# Patient Record
Sex: Male | Born: 1955 | Hispanic: No | Marital: Married | Smoking: Former smoker
Health system: Southern US, Community
[De-identification: ages and names within clinical notes are randomized; demographics above are authoritative.]

## PROBLEM LIST (undated history)

## (undated) ENCOUNTER — Ambulatory Visit (HOSPITAL_BASED_OUTPATIENT_CLINIC_OR_DEPARTMENT_OTHER)

## (undated) DIAGNOSIS — I2510 Atherosclerotic heart disease of native coronary artery without angina pectoris: Secondary | ICD-10-CM

## (undated) DIAGNOSIS — Z794 Long term (current) use of insulin: Secondary | ICD-10-CM

## (undated) DIAGNOSIS — E119 Type 2 diabetes mellitus without complications: Secondary | ICD-10-CM

## (undated) DIAGNOSIS — M4640 Discitis, unspecified, site unspecified: Secondary | ICD-10-CM

## (undated) DIAGNOSIS — M549 Dorsalgia, unspecified: Secondary | ICD-10-CM

## (undated) DIAGNOSIS — E559 Vitamin D deficiency, unspecified: Secondary | ICD-10-CM

## (undated) DIAGNOSIS — IMO0001 Reserved for inherently not codable concepts without codable children: Secondary | ICD-10-CM

## (undated) DIAGNOSIS — I219 Acute myocardial infarction, unspecified: Secondary | ICD-10-CM

## (undated) DIAGNOSIS — G8929 Other chronic pain: Secondary | ICD-10-CM

## (undated) DIAGNOSIS — I10 Essential (primary) hypertension: Secondary | ICD-10-CM

## (undated) HISTORY — PX: CORONARY ANGIOPLASTY: SHX604

## (undated) HISTORY — PX: BACK SURGERY: SHX140

## (undated) HISTORY — PX: VASECTOMY: SHX75

## (undated) HISTORY — PX: CARDIAC CATHETERIZATION: SHX172

## (undated) HISTORY — PX: TONSILLECTOMY: SUR1361

## (undated) HISTORY — PX: HAND SURGERY: SHX662

## (undated) HISTORY — PX: COLONOSCOPY: SHX174

---

## 2001-02-01 ENCOUNTER — Emergency Department (HOSPITAL_COMMUNITY): Admission: EM | Admit: 2001-02-01 | Discharge: 2001-02-01 | Payer: Self-pay | Admitting: Emergency Medicine

## 2001-02-05 ENCOUNTER — Encounter: Payer: Self-pay | Admitting: Orthopedic Surgery

## 2001-02-05 ENCOUNTER — Ambulatory Visit (HOSPITAL_COMMUNITY): Admission: RE | Admit: 2001-02-05 | Discharge: 2001-02-05 | Payer: Self-pay | Admitting: Orthopedic Surgery

## 2014-12-13 ENCOUNTER — Other Ambulatory Visit: Payer: Self-pay | Admitting: Orthopedic Surgery

## 2014-12-13 ENCOUNTER — Ambulatory Visit
Admission: RE | Admit: 2014-12-13 | Discharge: 2014-12-13 | Disposition: A | Discharge status: Home / Self Care | Payer: BLUE CROSS/BLUE SHIELD | Source: Ambulatory Visit | Attending: Orthopedic Surgery | Admitting: Orthopedic Surgery

## 2014-12-13 DIAGNOSIS — T148XXA Other injury of unspecified body region, initial encounter: Secondary | ICD-10-CM

## 2014-12-18 ENCOUNTER — Inpatient Hospital Stay (HOSPITAL_COMMUNITY): Payer: BLUE CROSS/BLUE SHIELD

## 2014-12-18 ENCOUNTER — Encounter (HOSPITAL_COMMUNITY): Payer: Self-pay | Admitting: Orthopedic Surgery

## 2014-12-18 ENCOUNTER — Inpatient Hospital Stay (HOSPITAL_COMMUNITY)
Admission: AD | Admit: 2014-12-18 | Discharge: 2014-12-20 | DRG: 488 | Disposition: A | Discharge status: Home Health | Payer: BLUE CROSS/BLUE SHIELD | Source: Ambulatory Visit | Attending: Orthopedic Surgery | Admitting: Orthopedic Surgery

## 2014-12-18 DIAGNOSIS — S82141A Displaced bicondylar fracture of right tibia, initial encounter for closed fracture: Principal | ICD-10-CM | POA: Diagnosis present

## 2014-12-18 DIAGNOSIS — S83241A Other tear of medial meniscus, current injury, right knee, initial encounter: Secondary | ICD-10-CM | POA: Diagnosis present

## 2014-12-18 DIAGNOSIS — Z833 Family history of diabetes mellitus: Secondary | ICD-10-CM

## 2014-12-18 DIAGNOSIS — Z955 Presence of coronary angioplasty implant and graft: Secondary | ICD-10-CM

## 2014-12-18 DIAGNOSIS — E785 Hyperlipidemia, unspecified: Secondary | ICD-10-CM

## 2014-12-18 DIAGNOSIS — D62 Acute posthemorrhagic anemia: Secondary | ICD-10-CM | POA: Diagnosis not present

## 2014-12-18 DIAGNOSIS — S82143A Displaced bicondylar fracture of unspecified tibia, initial encounter for closed fracture: Secondary | ICD-10-CM

## 2014-12-18 DIAGNOSIS — W11XXXA Fall on and from ladder, initial encounter: Secondary | ICD-10-CM | POA: Diagnosis present

## 2014-12-18 DIAGNOSIS — E559 Vitamin D deficiency, unspecified: Secondary | ICD-10-CM | POA: Diagnosis present

## 2014-12-18 DIAGNOSIS — E1165 Type 2 diabetes mellitus with hyperglycemia: Secondary | ICD-10-CM | POA: Diagnosis present

## 2014-12-18 DIAGNOSIS — Z87891 Personal history of nicotine dependence: Secondary | ICD-10-CM | POA: Diagnosis not present

## 2014-12-18 DIAGNOSIS — Z01818 Encounter for other preprocedural examination: Secondary | ICD-10-CM

## 2014-12-18 DIAGNOSIS — Z79899 Other long term (current) drug therapy: Secondary | ICD-10-CM | POA: Diagnosis not present

## 2014-12-18 DIAGNOSIS — M549 Dorsalgia, unspecified: Secondary | ICD-10-CM | POA: Diagnosis present

## 2014-12-18 DIAGNOSIS — Z794 Long term (current) use of insulin: Secondary | ICD-10-CM | POA: Diagnosis not present

## 2014-12-18 DIAGNOSIS — S83281A Other tear of lateral meniscus, current injury, right knee, initial encounter: Secondary | ICD-10-CM | POA: Diagnosis present

## 2014-12-18 DIAGNOSIS — Z79891 Long term (current) use of opiate analgesic: Secondary | ICD-10-CM | POA: Diagnosis not present

## 2014-12-18 DIAGNOSIS — Z01810 Encounter for preprocedural cardiovascular examination: Secondary | ICD-10-CM | POA: Diagnosis not present

## 2014-12-18 DIAGNOSIS — Z7902 Long term (current) use of antithrombotics/antiplatelets: Secondary | ICD-10-CM | POA: Diagnosis not present

## 2014-12-18 DIAGNOSIS — Z8249 Family history of ischemic heart disease and other diseases of the circulatory system: Secondary | ICD-10-CM | POA: Diagnosis not present

## 2014-12-18 DIAGNOSIS — I2510 Atherosclerotic heart disease of native coronary artery without angina pectoris: Secondary | ICD-10-CM | POA: Diagnosis present

## 2014-12-18 DIAGNOSIS — I10 Essential (primary) hypertension: Secondary | ICD-10-CM

## 2014-12-18 DIAGNOSIS — G8929 Other chronic pain: Secondary | ICD-10-CM | POA: Diagnosis present

## 2014-12-18 DIAGNOSIS — E119 Type 2 diabetes mellitus without complications: Secondary | ICD-10-CM

## 2014-12-18 DIAGNOSIS — Z419 Encounter for procedure for purposes other than remedying health state, unspecified: Secondary | ICD-10-CM

## 2014-12-18 DIAGNOSIS — I252 Old myocardial infarction: Secondary | ICD-10-CM

## 2014-12-18 HISTORY — DX: Discitis, unspecified, site unspecified: M46.40

## 2014-12-18 HISTORY — DX: Reserved for inherently not codable concepts without codable children: IMO0001

## 2014-12-18 HISTORY — DX: Dorsalgia, unspecified: M54.9

## 2014-12-18 HISTORY — DX: Vitamin D deficiency, unspecified: E55.9

## 2014-12-18 HISTORY — DX: Acute myocardial infarction, unspecified: I21.9

## 2014-12-18 HISTORY — DX: Atherosclerotic heart disease of native coronary artery without angina pectoris: I25.10

## 2014-12-18 HISTORY — DX: Long term (current) use of insulin: Z79.4

## 2014-12-18 HISTORY — DX: Displaced bicondylar fracture of unspecified tibia, initial encounter for closed fracture: S82.143A

## 2014-12-18 HISTORY — DX: Other chronic pain: G89.29

## 2014-12-18 HISTORY — DX: Type 2 diabetes mellitus without complications: E11.9

## 2014-12-18 LAB — APTT: aPTT: 34 seconds (ref 24–37)

## 2014-12-18 LAB — GLUCOSE, CAPILLARY
GLUCOSE-CAPILLARY: 192 mg/dL — AB (ref 65–99)
GLUCOSE-CAPILLARY: 143 mg/dL — AB (ref 65–99)
Glucose-Capillary: 186 mg/dL — ABNORMAL HIGH (ref 65–99)

## 2014-12-18 LAB — COMPREHENSIVE METABOLIC PANEL
ALT: 29 U/L (ref 17–63)
ANION GAP: 8 (ref 5–15)
AST: 25 U/L (ref 15–41)
Albumin: 3.5 g/dL (ref 3.5–5.0)
Alkaline Phosphatase: 81 U/L (ref 38–126)
BUN: 18 mg/dL (ref 6–20)
CHLORIDE: 102 mmol/L (ref 101–111)
CO2: 27 mmol/L (ref 22–32)
Calcium: 9.1 mg/dL (ref 8.9–10.3)
Creatinine, Ser: 0.89 mg/dL (ref 0.61–1.24)
GFR calc Af Amer: >60 mL/min (ref >60)
GFR calc non Af Amer: >60 mL/min (ref >60)
GLUCOSE: 149 mg/dL — AB (ref 65–99)
Potassium: 4.2 mmol/L (ref 3.5–5.1)
SODIUM: 137 mmol/L (ref 135–145)
TOTAL PROTEIN: 6.2 g/dL — AB (ref 6.5–8.1)
Total Bilirubin: 1 mg/dL (ref 0.3–1.2)

## 2014-12-18 LAB — CBC WITH DIFFERENTIAL/PLATELET
Basophils Absolute: 0 10*3/uL (ref 0.0–0.1)
Basophils Relative: 0 % (ref 0–1)
EOS PCT: 3 % (ref 0–5)
Eosinophils Absolute: 0.2 10*3/uL (ref 0.0–0.7)
HCT: 38.8 % — ABNORMAL LOW (ref 39.0–52.0)
Hemoglobin: 13.3 g/dL (ref 13.0–17.0)
LYMPHS ABS: 1.4 10*3/uL (ref 0.7–4.0)
Lymphocytes Relative: 19 % (ref 12–46)
MCH: 29.1 pg (ref 26.0–34.0)
MCHC: 34.3 g/dL (ref 30.0–36.0)
MCV: 84.9 fL (ref 78.0–100.0)
MONOS PCT: 9 % (ref 3–12)
Monocytes Absolute: 0.6 10*3/uL (ref 0.1–1.0)
Neutro Abs: 5.1 10*3/uL (ref 1.7–7.7)
Neutrophils Relative %: 69 % (ref 43–77)
Platelets: 215 10*3/uL (ref 150–400)
RBC: 4.57 MIL/uL (ref 4.22–5.81)
RDW: 13.7 % (ref 11.5–15.5)
WBC: 7.3 10*3/uL (ref 4.0–10.5)

## 2014-12-18 LAB — PROTIME-INR
INR: 1.2 (ref 0.00–1.49)
PROTHROMBIN TIME: 15.3 s — AB (ref 11.6–15.2)

## 2014-12-18 LAB — URINALYSIS, ROUTINE W REFLEX MICROSCOPIC
BILIRUBIN URINE: NEGATIVE
GLUCOSE, UA: NEGATIVE mg/dL
Hgb urine dipstick: NEGATIVE
KETONES UR: NEGATIVE mg/dL
Leukocytes, UA: NEGATIVE
NITRITE: NEGATIVE
Protein, ur: NEGATIVE mg/dL
SPECIFIC GRAVITY, URINE: 1.014 (ref 1.005–1.030)
Urobilinogen, UA: 1 mg/dL (ref 0.0–1.0)
pH: 7 (ref 5.0–8.0)

## 2014-12-18 LAB — TSH: TSH: 2.829 u[IU]/mL (ref 0.350–4.500)

## 2014-12-18 LAB — PHOSPHORUS: Phosphorus: 4 mg/dL (ref 2.5–4.6)

## 2014-12-18 LAB — TYPE AND SCREEN
ABO/RH(D): O POS
ANTIBODY SCREEN: NEGATIVE

## 2014-12-18 LAB — MAGNESIUM: Magnesium: 2.1 mg/dL (ref 1.7–2.4)

## 2014-12-18 LAB — ABO/RH: ABO/RH(D): O POS

## 2014-12-18 MED ORDER — OXYCODONE HCL 5 MG PO TABS
5.0000 mg | ORAL_TABLET | ORAL | Status: DC | PRN
  Administered 2014-12-18 – 2014-12-20 (×6): 10 mg via ORAL
  Filled 2014-12-18 (×6): qty 2

## 2014-12-18 MED ORDER — ACETAMINOPHEN 650 MG RE SUPP: 650.0000 mg | Freq: Four times a day (QID) | RECTAL | Status: DC | PRN

## 2014-12-18 MED ORDER — HYDROMORPHONE HCL 1 MG/ML IJ SOLN
0.5000 mg | INTRAMUSCULAR | Status: DC | PRN
  Administered 2014-12-18 – 2014-12-19 (×2): 1 mg via INTRAVENOUS
  Administered 2014-12-19: 0.5 mg via INTRAVENOUS
  Filled 2014-12-18 (×2): qty 1

## 2014-12-18 MED ORDER — DOCUSATE SODIUM 100 MG PO CAPS
100.0000 mg | ORAL_CAPSULE | Freq: Two times a day (BID) | ORAL | Status: DC
  Administered 2014-12-18 – 2014-12-20 (×4): 100 mg via ORAL
  Filled 2014-12-18 (×4): qty 1

## 2014-12-18 MED ORDER — MAGNESIUM CITRATE PO SOLN: 1.0000 | Freq: Once | ORAL | Status: AC | PRN

## 2014-12-18 MED ORDER — ALUM & MAG HYDROXIDE-SIMETH 200-200-20 MG/5ML PO SUSP: 30.0000 mL | Freq: Four times a day (QID) | ORAL | Status: DC | PRN

## 2014-12-18 MED ORDER — INSULIN NPH (HUMAN) (ISOPHANE) 100 UNIT/ML ~~LOC~~ SUSP
30.0000 [IU] | Freq: Two times a day (BID) | SUBCUTANEOUS | Status: DC
  Administered 2014-12-18: 20 [IU] via SUBCUTANEOUS
  Administered 2014-12-19 – 2014-12-20 (×2): 30 [IU] via SUBCUTANEOUS
  Filled 2014-12-18: qty 10

## 2014-12-18 MED ORDER — SODIUM CHLORIDE 0.9 % IJ SOLN: 3.0000 mL | Freq: Two times a day (BID) | INTRAMUSCULAR | Status: DC

## 2014-12-18 MED ORDER — POLYETHYLENE GLYCOL 3350 17 G PO PACK
17.0000 g | PACK | Freq: Every day | ORAL | Status: DC
  Administered 2014-12-18 – 2014-12-20 (×2): 17 g via ORAL
  Filled 2014-12-18 (×2): qty 1

## 2014-12-18 MED ORDER — SODIUM CHLORIDE 0.9 % IV SOLN: 250.0000 mL | INTRAVENOUS | Status: DC | PRN

## 2014-12-18 MED ORDER — METHOCARBAMOL 500 MG PO TABS
500.0000 mg | ORAL_TABLET | Freq: Four times a day (QID) | ORAL | Status: DC | PRN
  Administered 2014-12-19: 1000 mg via ORAL
  Administered 2014-12-20: 500 mg via ORAL
  Filled 2014-12-18 (×2): qty 2

## 2014-12-18 MED ORDER — LACTATED RINGERS IV SOLN
INTRAVENOUS | Status: DC
  Administered 2014-12-18: via INTRAVENOUS

## 2014-12-18 MED ORDER — CEFAZOLIN SODIUM-DEXTROSE 2-3 GM-% IV SOLR
2.0000 g | INTRAVENOUS | Status: DC
  Filled 2014-12-18: qty 50

## 2014-12-18 MED ORDER — SODIUM CHLORIDE 0.9 % IJ SOLN
3.0000 mL | Freq: Two times a day (BID) | INTRAMUSCULAR | Status: DC
  Administered 2014-12-18: 3 mL via INTRAVENOUS

## 2014-12-18 MED ORDER — CHLORHEXIDINE GLUCONATE 4 % EX LIQD
60.0000 mL | Freq: Once | CUTANEOUS | Status: AC
  Administered 2014-12-19: 4 via TOPICAL
  Filled 2014-12-18: qty 60

## 2014-12-18 MED ORDER — ACETAMINOPHEN 325 MG PO TABS: 650.0000 mg | ORAL_TABLET | Freq: Four times a day (QID) | ORAL | Status: DC | PRN

## 2014-12-18 MED ORDER — BISACODYL 5 MG PO TBEC: 5.0000 mg | DELAYED_RELEASE_TABLET | Freq: Every day | ORAL | Status: DC | PRN

## 2014-12-18 MED ORDER — SODIUM CHLORIDE 0.9 % IJ SOLN: 3.0000 mL | INTRAMUSCULAR | Status: DC | PRN

## 2014-12-18 MED ORDER — METHOCARBAMOL 1000 MG/10ML IJ SOLN
1000.0000 mg | Freq: Four times a day (QID) | INTRAVENOUS | Status: DC | PRN
  Filled 2014-12-18: qty 10

## 2014-12-18 MED ORDER — CHLORHEXIDINE GLUCONATE 4 % EX LIQD
60.0000 mL | Freq: Once | CUTANEOUS | Status: DC
  Filled 2014-12-18: qty 60

## 2014-12-18 MED ORDER — PROMETHAZINE HCL 25 MG PO TABS: 12.5000 mg | ORAL_TABLET | Freq: Four times a day (QID) | ORAL | Status: DC | PRN

## 2014-12-18 MED ORDER — INSULIN ASPART 100 UNIT/ML ~~LOC~~ SOLN: 0.0000 [IU] | Freq: Every day | SUBCUTANEOUS | Status: DC

## 2014-12-18 MED ORDER — OXYCODONE-ACETAMINOPHEN 5-325 MG PO TABS
1.0000 | ORAL_TABLET | Freq: Four times a day (QID) | ORAL | Status: DC | PRN
  Administered 2014-12-18: 2 via ORAL
  Filled 2014-12-18: qty 2

## 2014-12-18 MED ORDER — INSULIN ASPART 100 UNIT/ML ~~LOC~~ SOLN
0.0000 [IU] | Freq: Three times a day (TID) | SUBCUTANEOUS | Status: DC
  Administered 2014-12-18: 3 [IU] via SUBCUTANEOUS
  Administered 2014-12-18: 5 [IU] via SUBCUTANEOUS
  Administered 2014-12-19 – 2014-12-20 (×3): 3 [IU] via SUBCUTANEOUS

## 2014-12-18 MED ORDER — CEFAZOLIN SODIUM-DEXTROSE 2-3 GM-% IV SOLR
2.0000 g | Freq: Once | INTRAVENOUS | Status: AC
  Administered 2014-12-19: 2 g via INTRAVENOUS

## 2014-12-18 NOTE — H&P (Signed)
Orthopaedic Trauma Service H&P  PCP: Dr. Hurley Cisco Cardiologist: Dr. Tomie China, [facility] in [place]  Chief Complaint: Right tibial plateau fracture HPI:   59 year old male referred to the OTS by [facility] orthopedics after sustaining an injury while at work. I did of injury was 12/02/2014. Patient was going up into an attic to check and conditioning unit and fell off ladder. States his leg got stuck in a running subsequent we bent when the ladder hit the ground. Patient had immediate onset of pain and inability to bear weight. Again patient was initially seen at [facility] referred to [facility] orthopedics and sports medicine. He was seen and evaluated there were workup did include an MRI which showed tear of medial and lateral menisci as well as a bicondylar tibial plateau fracture. Cruciates were intact with a sprain to his LCL. Given the complexity of the injuries patient was referred to the orthopedic trauma specialists for definitive management. Patient was seen and evaluated on 12/13/2014. Patient states that he is wearing his knee immobilizer maintaining nonweightbearing but was having quite a bit of pain. Patient states that he also is on Plavix chronically which he stopped right after his fall has essentially been off it for 2 weeks. He does have a history of CAD and multiple MIs. In 2009 he had a myocardial infarction a stent was placed in the mid LAD after this. He sustained another myocardial infarction in 2014- 2015 where a balloon was placed. He was last seen by cardiology about a year ago. He remains on baby aspirin at this time but again is off his Plavix. I did start him on Lovenox after his office eval, 40 mg subcutaneous daily.   He is also insulin-dependent diabetic. States that his sugars usually run in the 140-150 range.  Patient denies any chest pain or shortness of breath. No orthopnea was not getting winded going up and down stairs or doing work.   To fully evaluate  his fracture we did obtain a CT scan prior to admission which does demonstrate a bicondylar tibial plateau fracture.   I did attempt to have patient seen by his cardiologist in [place] however they were unable to work him in and recommended admission day before anticipated surgery for a cardiac workup.   Changes were made to the patient's pain medication regimen after his initial evaluation the office and has had noted improved pain control since that time is actually been able to get some sleep as well.  Past Medical History  Diagnosis Date  . IDDM (insulin dependent diabetes mellitus)   . CAD (coronary artery disease)   . MI (myocardial infarction)     x 2, stent in 2009 (mid LAD), balloon 2014  . Chronic back pain   . Discitis     Past Surgical History  Procedure Laterality Date  . Cardiac catheterization    . Hand surgery Left   . Back surgery      Family History  Problem Relation Age of Onset  . Diabetes    . CAD    . Heart attack     Social History:  reports that he has quit smoking. He does not have any smokeless tobacco history on file. He reports that he drinks alcohol. He reports that he does not use illicit drugs.  Allergies: Allergies not on file  Medications prior to admission  Percocet 5/325 one to 2 by mouth every 6 hours  OxyIR 5-10 mg 1-2 every 3 hours as needed for breakthrough pain  between Percocet doses  Robaxin 500 mg 1-2 every 6 hours for spasms  Metoprolol 25 mg half tablet twice a day  Nitroglycerin 0.4 mg sublingual as needed for chest pain  Humulin 70/30 30 units twice a day sq  Lipitor 20 mg daily  Metformin 1000 mg every 12 hours  Fenofibrate 145 mg daily  Patient on Plavix daily but this has been on hold for 2 weeks. Done by the patient   No results found for this or any previous visit (from the past 48 hour(s)). X-ray Chest Pa And Lateral  12/18/2014   CLINICAL DATA:  Preoperative evaluation prior to orthopedic procedure  EXAM: CHEST  2  VIEW  COMPARISON:  Portable chest x-ray of December 02, 2014 and May 09, 2014  FINDINGS: The lungs are reasonably well inflated. There is stable scarring at the left lung base. The heart is normal in size. The pulmonary vascularity is not engorged. The mediastinum is normal in width. There is no pleural effusion. The bony thorax is unremarkable.  IMPRESSION: There is no active cardiopulmonary disease.   Electronically Signed   By: David  Swaziland M.D.   On: 12/18/2014 10:15   CT right knee FINDINGS: As seen on the patient's plain films, the patient has a comminuted lateral tibial plateau fracture. The lateral 2.3 cm of the lateral tibial plateau is a separate fragment. Anteriorly, this fragment is inferiorly angulated on its medial side with depression of up to 0.7 cm identified. Nondisplaced fracture lines extend along the medial and lateral tibial eminences. The fracture also involves the posterior aspect of the medial tibial plateau. Articular surface component of the medial tibial plateau is triangular in shape and measures 1.9 cm AP by up to 2.0 cm transverse with minimal depression of 0.1-0.2 cm.   The patient also is a nondisplaced fracture of the fibular neck. The fracture involves the medial margin of the fibular neck with minimal fragment override of 0.2 cm. No other fracture is identified. Lipohemarthrosis is noted.   Review of Systems  Constitutional: Negative for fever, chills and malaise/fatigue.  Eyes: Negative for blurred vision.  Respiratory: Negative for shortness of breath and wheezing.   Cardiovascular: Negative for chest pain, palpitations and orthopnea.  Gastrointestinal: Negative for nausea, vomiting and abdominal pain.  Genitourinary: Negative for dysuria.  Musculoskeletal:       Right knee pain  Neurological: Negative for tingling, sensory change, weakness and headaches.  Endo/Heme/Allergies: Negative for polydipsia.    Blood pressure 120/64, pulse 74,  temperature 98.2 F (36.8 C), resp. rate 18, SpO2 98 %. Physical Exam  Constitutional: He appears well-developed and well-nourished. No distress.  HENT:  Head: Normocephalic and atraumatic.  Nose: Nose normal.  Mouth/Throat: Oropharynx is clear and moist.  Eyes: Conjunctivae and EOM are normal. Pupils are equal, round, and reactive to light.  Cardiovascular: Normal rate, regular rhythm, S1 normal and S2 normal.   Respiratory: Effort normal and breath sounds normal. No accessory muscle usage. No respiratory distress.  GI: Soft. Normal appearance and bowel sounds are normal. There is no tenderness.  Musculoskeletal:  Right lower extremity   Right hip and ankle are unremarkable   Mild swelling and ecchymosis noted to the right leg no pitting edema is noted.   Moderate knee effusion present, hemarthrosis   Exquisite tenderness palpation over the proximal tibia and fibula   Ligamentous evaluation not performed as his knee would be unstable given his fracture   No palpable cords   Extremity is warm  Compartments are soft and nontender, no pain with passive stretching   DPN, SPN, TN sensory functions intact   General, FHL, anterior tibialis, posterior tibialis, peroneals and gastrocsoleus complex motor functions are intact   + DP pulse      Assessment/Plan  59 year old male with numerous medical comorbidities with right bicondylar tibial plateau fracture  1. Fall off a ladder  2. Right bicondylar tibial plateau fracture with medial and lateral meniscal tears  Patient will need surgical intervention to address his fracture to restore alignment, stability, address his meniscal injuries as needed and restored joint surface congruity  As CT scan is somewhat concerning for the need to perform a dual approach given the posterior shear that appeared to be present.  Patient will be nonweightbearing for 8 weeks postoperatively  Unrestricted range of motion postoperatively  Will obtain a  hinged knee brace after surgery  Continue bedrest for today, ice and elevate leg  3.Pain management:  Percocet, OxyIR and Robaxin as needed  4.ABL anemia/Hemodynamics  Labs pending  5.Medical issues       Cardiac:  Cardiology consult for preoperative eval  EKG has been ordered and is pending       Endocrine  Patient's home insulin has been ordered, he'll be placed on sliding scale and her hemoglobin A1c is pending  I suspect that he is somewhat uncontrolled as he self-administered reports that her sugars are in the mid to upper 100s. Will likely obtain diabetic care coordinator consult as well  Strict sugar control will be imperative to minimize patient's risk of infection and nonunion. Patient has had issues in the past with poor sugar control resulting in a deep infection ( history of discitis)     6. DVT/PE prophylaxis:  Lovenox postoperatively, I did have the patient on Lovenox after his office evaluation as you taken himself off Plavix at the time of his injury  Will restart Plavix around postoperative day 4 or 5  7. ID:   Perioperative antibiotics  8. Metabolic Bone Disease:  Metabolic bone workup pending  9. Activity:  Bedrest for now  10. FEN/Foley/Lines:  Carb modified diet  Npo after midnight  11. Impediments to fracture healing:  Diabetes   12. Dispo:  Cards eval  OR tomorrow if cleared, surgery scheduled at 1055   Mearl Latin, PA-C [facility] [phone] (P) 12/18/2014, 10:24 AM

## 2014-12-18 NOTE — Consult Note (Signed)
CARDIOLOGY CONSULT NOTE   Patient ID: Mitchell Hammond MRN: [id number] DOB/AGE: 1956/04/28 59 y.o.  Admit date: 12/18/2014  Primary Physician   Pcp Not In System Primary Cardiologist   Dr Tomie China [phone])  Reason for Consultation   Preop eval  Mitchell Hammond is a 59 y.o. year old male with a history of IDDM, PCI, MI, who was admitted 08/01 in order to have cardiology eval prior to knee surgery for a R bicondylar tibial plateau fracture.  Mitchell Hammond has not had significant chest pain. He has had chest pain with extra exertion, carrying heavy things. He stopped, took total of SL NTG x 2 and the pain resolved. He has not had chest pain unless he was doing extra exertion, and then only rarely since his last PCI. He has no DOE, can walk up flights of steps and go up/down ladders without difficulty. He is active with grandchildren and animals around the house, without problems.   He previously tolerated back surgery and a second surgery because of infection without difficulty in 2014. He generally feels he was doing very well until this injury.  Past Medical History  Diagnosis Date  . IDDM (insulin dependent diabetes mellitus)   . CAD (coronary artery disease)   . MI (myocardial infarction)     x 2, Multi-link Vision 3.5 x 15 mm BMS in 2009 (mid LAD), balloon 2015  . Chronic back pain   . Discitis      Past Surgical History  Procedure Laterality Date  . Cardiac catheterization      x 2, 2009 & 2015  . Hand surgery Left   . Back surgery      Allergies not on file  I have reviewed the patient's current medications . [START ON 12/19/2014]  ceFAZolin (ANCEF) IV  2 g Intravenous Once  . chlorhexidine  60 mL Topical Once  . docusate sodium  100 mg Oral BID  . insulin aspart  0-15 Units Subcutaneous TID WC  . insulin aspart  0-5 Units Subcutaneous QHS  . insulin NPH Human  30 Units Subcutaneous BID AC & HS  . polyethylene glycol  17 g Oral Daily  . sodium chloride  3 mL  Intravenous Q12H  . sodium chloride  3 mL Intravenous Q12H   . [START ON 12/19/2014] lactated ringers     sodium chloride, acetaminophen **OR** acetaminophen, alum & mag hydroxide-simeth, bisacodyl, HYDROmorphone (DILAUDID) injection, magnesium citrate, methocarbamol (ROBAXIN)  IV, methocarbamol, oxyCODONE, oxyCODONE-acetaminophen, promethazine, sodium chloride  Prior to Admission medications   Not on File    History   Social History  . Marital Status: Married    Spouse Name: N/A  . Number of Children: N/A  . Years of Education: N/A   Occupational History  . maintenance manager    Social History Main Topics  . Smoking status: Former Smoker -- 1.00 packs/day    Types: Cigarettes    Quit date: 02/20/2007  . Smokeless tobacco: Never Used  . Alcohol Use: No     Comment: None in years  . Drug Use: No  . Sexual Activity: Not on file   Other Topics Concern  . Not on file   Social History Narrative   Pt lives with wife and daughter in [place].     Family Status  Relation Status Death Age  . Mother Deceased 49    hx CAD  . Father Deceased late 22s    no hx CAD   Family History  Problem Relation Age of Onset  . Diabetes Mother   . CAD Mother      ROS: Some RLE edema since the injury/immobilization, otherwise does not have.  Full 14 point review of systems complete and found to be negative unless listed above.  Physical Exam: Blood pressure 120/64, pulse 74, temperature 98.2 F (36.8 C), resp. rate 18, SpO2 98 %.  General: Well developed, well nourished, male in no acute distress Head: Eyes PERRLA, No xanthomas.   Normocephalic and atraumatic, oropharynx without edema or exudate. Dentition: good  Lungs: few rales bases, clears w/ cough Heart: HRRR S1 S2, no rub/gallop,no murmur. pulses are 2+ all 4 extrem.   Neck: No carotid bruits. No lymphadenopathy.  JVD not elevated. Abdomen: Bowel sounds present, abdomen soft and non-tender without masses or hernias noted. Msk:   No spine or cva tenderness. No weakness, no joint deformities or effusions. Extremities: No clubbing or cyanosis. No edema L, trace R.  Neuro: Alert and oriented X 3. No focal deficits noted. Psych:  Good affect, responds appropriately Skin: No rashes or lesions noted.  Labs: No results found for: WBC, HGB, HCT, MCV, PLT No results for input(s): INR in the last 72 hours. No results for input(s): NA, K, CL, CO2, BUN, CREATININE, CALCIUM, PROT, BILITOT, ALKPHOS, ALT, AST, GLUCOSE in the last 168 hours.  Invalid input(s): LABALBU No results for input(s): CKTOTAL, CKMB, TROPONINI in the last 72 hours. No results found for: MG  ECG: SR, normal ECG.    Radiology:  X-ray Chest Pa And Lateral 12/18/2014   CLINICAL DATA:  Preoperative evaluation prior to orthopedic procedure  EXAM: CHEST  2 VIEW  COMPARISON:  Portable chest x-ray of December 02, 2014 and May 09, 2014  FINDINGS: The lungs are reasonably well inflated. There is stable scarring at the left lung base. The heart is normal in size. The pulmonary vascularity is not engorged. The mediastinum is normal in width. There is no pleural effusion. The bony thorax is unremarkable.  IMPRESSION: There is no active cardiopulmonary disease.   Electronically Signed   By: David  [place]and M.D.   On: 12/18/2014 10:15     ASSESSMENT AND PLAN:   The patient was seen today by Dr Delton See, the patient evaluated and the data reviewed.     Hx CAD - has been revascularized within the last year - only has sx with extreme exertion, relieved by rest and SL NTG - in the course of his job/life, he is active without difficulty.  - Have called Dr Tomie China for records, may have to call [facility] [phone]) as well if info is not adequate. - MD advise if any further workup needed.  Otherwise, per Ortho. Active Problems:   Tibial plateau fracture   Fracture, tibial plateau   Signed: Theodore Demark, PA-C 12/18/2014 12:06 PM Beeper [phone]  Co-Sign MD  The  patient was seen, examined and discussed with Mitchell Demark, PA-C and I agree with the above.   A pleasant 59 year old male with h/o DM, CAD s/p MI, PCI in 2008, and balloon angioplasty in 04/2014. He was taking aspirin and plavix until he injured his leg. He states that his last episode of chest pain was prior to the angioplasty and he has been working full time and active with no limitations with angina, SOB, palpitations,  Or syncope. He also denies lower extremity edema, orthopnea, PND.  He is now admitted for a knee surgery for a R bicondylar tibial plateau fracture. His ECG is normal.  He is considered an intermediate risk for a moderate risk surgery, however there is currently no contraindication for his planned surgery. No further cardiac work up is necessary prior to the surgery. I would start him on low dose carvedilol 3.125 mg po BID to be used in the perioperative period. He should also not stop taking baby aspirin and if absolutely contraindicated for surgery, it should be restarted as soon as possible post op.  We don't have his lipid panel in our system, but considering he is a diabetic with known CAD he should be on at least moderate dose of highly potent statin such as atorvastatin or rosuvastatin minimum 20 mg/day.    Lars Masson 12/18/2014

## 2014-12-19 ENCOUNTER — Encounter (HOSPITAL_COMMUNITY)
Admission: AD | Disposition: A | Discharge status: Home Health | Payer: Self-pay | Source: Ambulatory Visit | Attending: Orthopedic Surgery

## 2014-12-19 ENCOUNTER — Inpatient Hospital Stay (HOSPITAL_COMMUNITY): Payer: BLUE CROSS/BLUE SHIELD | Admitting: Anesthesiology

## 2014-12-19 ENCOUNTER — Inpatient Hospital Stay (HOSPITAL_COMMUNITY): Payer: BLUE CROSS/BLUE SHIELD

## 2014-12-19 HISTORY — PX: ORIF TIBIA PLATEAU: SHX2132

## 2014-12-19 HISTORY — PX: FASCIOTOMY: SHX132

## 2014-12-19 LAB — GLUCOSE, CAPILLARY
Glucose-Capillary: 118 mg/dL — ABNORMAL HIGH (ref 65–99)
Glucose-Capillary: 132 mg/dL — ABNORMAL HIGH (ref 65–99)
Glucose-Capillary: 188 mg/dL — ABNORMAL HIGH (ref 65–99)
Glucose-Capillary: 189 mg/dL — ABNORMAL HIGH (ref 65–99)
Glucose-Capillary: 160 mg/dL — ABNORMAL HIGH (ref 65–99)

## 2014-12-19 LAB — HEMOGLOBIN A1C
Hgb A1c MFr Bld: 7.6 % — ABNORMAL HIGH (ref 4.8–5.6)
Mean Plasma Glucose: 171 mg/dL

## 2014-12-19 LAB — MRSA PCR SCREENING: MRSA by PCR: NEGATIVE

## 2014-12-19 LAB — VITAMIN D 25 HYDROXY (VIT D DEFICIENCY, FRACTURES): Vit D, 25-Hydroxy: 22.8 ng/mL — ABNORMAL LOW (ref 30.0–100.0)

## 2014-12-19 SURGERY — OPEN REDUCTION INTERNAL FIXATION (ORIF) TIBIAL PLATEAU
Anesthesia: General | Site: Leg Lower | Laterality: Right

## 2014-12-19 MED ORDER — ONDANSETRON HCL 4 MG/2ML IJ SOLN
INTRAMUSCULAR | Status: AC
  Filled 2014-12-19: qty 2

## 2014-12-19 MED ORDER — DEXMEDETOMIDINE HCL IN NACL 200 MCG/50ML IV SOLN
INTRAVENOUS | Status: DC | PRN
  Administered 2014-12-19 (×2): 8 ug via INTRAVENOUS

## 2014-12-19 MED ORDER — DIPHENHYDRAMINE HCL 50 MG/ML IJ SOLN: 12.5000 mg | Freq: Four times a day (QID) | INTRAMUSCULAR | Status: DC | PRN

## 2014-12-19 MED ORDER — SUCCINYLCHOLINE CHLORIDE 20 MG/ML IJ SOLN
INTRAMUSCULAR | Status: DC | PRN
  Administered 2014-12-19: 100 mg via INTRAVENOUS

## 2014-12-19 MED ORDER — MIDAZOLAM HCL 5 MG/5ML IJ SOLN
INTRAMUSCULAR | Status: DC | PRN
  Administered 2014-12-19 (×2): 1 mg via INTRAVENOUS

## 2014-12-19 MED ORDER — METOPROLOL SUCCINATE ER 25 MG PO TB24
25.0000 mg | ORAL_TABLET | Freq: Every day | ORAL | Status: DC
  Administered 2014-12-19 – 2014-12-20 (×2): 25 mg via ORAL
  Filled 2014-12-19 (×2): qty 1

## 2014-12-19 MED ORDER — FENTANYL CITRATE (PF) 250 MCG/5ML IJ SOLN
INTRAMUSCULAR | Status: AC
  Filled 2014-12-19: qty 5

## 2014-12-19 MED ORDER — PROPOFOL 10 MG/ML IV BOLUS
INTRAVENOUS | Status: AC
  Filled 2014-12-19: qty 20

## 2014-12-19 MED ORDER — NITROGLYCERIN 0.4 MG SL SUBL: 0.4000 mg | SUBLINGUAL_TABLET | SUBLINGUAL | Status: DC | PRN

## 2014-12-19 MED ORDER — DEXMEDETOMIDINE HCL IN NACL 200 MCG/50ML IV SOLN
INTRAVENOUS | Status: AC
  Filled 2014-12-19: qty 50

## 2014-12-19 MED ORDER — METOCLOPRAMIDE HCL 5 MG PO TABS: 5.0000 mg | ORAL_TABLET | Freq: Three times a day (TID) | ORAL | Status: DC | PRN

## 2014-12-19 MED ORDER — ASPIRIN EC 81 MG PO TBEC
81.0000 mg | DELAYED_RELEASE_TABLET | Freq: Every day | ORAL | Status: DC
  Administered 2014-12-19 – 2014-12-20 (×2): 81 mg via ORAL
  Filled 2014-12-19 (×2): qty 1

## 2014-12-19 MED ORDER — HYDROMORPHONE HCL 1 MG/ML IJ SOLN
INTRAMUSCULAR | Status: AC
  Filled 2014-12-19: qty 1

## 2014-12-19 MED ORDER — NALOXONE HCL 0.4 MG/ML IJ SOLN: 0.4000 mg | INTRAMUSCULAR | Status: DC | PRN

## 2014-12-19 MED ORDER — GABAPENTIN 300 MG PO CAPS
300.0000 mg | ORAL_CAPSULE | Freq: Three times a day (TID) | ORAL | Status: DC
  Administered 2014-12-19 – 2014-12-20 (×3): 300 mg via ORAL
  Filled 2014-12-19 (×3): qty 1

## 2014-12-19 MED ORDER — PHENYLEPHRINE HCL 10 MG/ML IJ SOLN
INTRAMUSCULAR | Status: DC | PRN
Start: 2014-12-19 — End: 2014-12-19
  Administered 2014-12-19: 80 ug via INTRAVENOUS
  Administered 2014-12-19: 40 ug via INTRAVENOUS
  Administered 2014-12-19: 80 ug via INTRAVENOUS
  Administered 2014-12-19 (×2): 40 ug via INTRAVENOUS

## 2014-12-19 MED ORDER — LIDOCAINE HCL (CARDIAC) 20 MG/ML IV SOLN
INTRAVENOUS | Status: DC | PRN
  Administered 2014-12-19: 80 mg via INTRAVENOUS

## 2014-12-19 MED ORDER — METFORMIN HCL 500 MG PO TABS
1000.0000 mg | ORAL_TABLET | Freq: Two times a day (BID) | ORAL | Status: DC
  Administered 2014-12-19 – 2014-12-20 (×2): 1000 mg via ORAL
  Filled 2014-12-19 (×2): qty 2

## 2014-12-19 MED ORDER — GEMFIBROZIL 600 MG PO TABS
600.0000 mg | ORAL_TABLET | Freq: Two times a day (BID) | ORAL | Status: DC
  Administered 2014-12-19 – 2014-12-20 (×2): 600 mg via ORAL
  Filled 2014-12-19 (×4): qty 1

## 2014-12-19 MED ORDER — PHENYLEPHRINE HCL 10 MG/ML IJ SOLN
10.0000 mg | INTRAVENOUS | Status: DC | PRN
  Administered 2014-12-19: 25 ug/min via INTRAVENOUS

## 2014-12-19 MED ORDER — HYDROMORPHONE HCL 1 MG/ML IJ SOLN
0.2500 mg | INTRAMUSCULAR | Status: DC | PRN
  Administered 2014-12-19 (×3): 0.5 mg via INTRAVENOUS

## 2014-12-19 MED ORDER — ONDANSETRON HCL 4 MG/2ML IJ SOLN
INTRAMUSCULAR | Status: DC | PRN
  Administered 2014-12-19: 4 mg via INTRAVENOUS

## 2014-12-19 MED ORDER — PHENYLEPHRINE 40 MCG/ML (10ML) SYRINGE FOR IV PUSH (FOR BLOOD PRESSURE SUPPORT)
PREFILLED_SYRINGE | INTRAVENOUS | Status: AC
  Filled 2014-12-19: qty 10

## 2014-12-19 MED ORDER — METOCLOPRAMIDE HCL 5 MG/ML IJ SOLN: 5.0000 mg | Freq: Three times a day (TID) | INTRAMUSCULAR | Status: DC | PRN

## 2014-12-19 MED ORDER — CEFAZOLIN SODIUM-DEXTROSE 2-3 GM-% IV SOLR
2.0000 g | Freq: Four times a day (QID) | INTRAVENOUS | Status: AC
  Administered 2014-12-19 – 2014-12-20 (×3): 2 g via INTRAVENOUS
  Filled 2014-12-19 (×3): qty 50

## 2014-12-19 MED ORDER — FENTANYL CITRATE (PF) 100 MCG/2ML IJ SOLN
INTRAMUSCULAR | Status: DC | PRN
Start: 2014-12-19 — End: 2014-12-19
  Administered 2014-12-19 (×3): 100 ug via INTRAVENOUS
  Administered 2014-12-19: 50 ug via INTRAVENOUS
  Administered 2014-12-19: 150 ug via INTRAVENOUS
  Administered 2014-12-19: 50 ug via INTRAVENOUS
  Administered 2014-12-19: 150 ug via INTRAVENOUS
  Administered 2014-12-19: 50 ug via INTRAVENOUS

## 2014-12-19 MED ORDER — ROCURONIUM BROMIDE 50 MG/5ML IV SOLN
INTRAVENOUS | Status: AC
  Filled 2014-12-19: qty 1

## 2014-12-19 MED ORDER — DIPHENHYDRAMINE HCL 12.5 MG/5ML PO ELIX: 12.5000 mg | ORAL_SOLUTION | Freq: Four times a day (QID) | ORAL | Status: DC | PRN

## 2014-12-19 MED ORDER — ACETAMINOPHEN 650 MG RE SUPP: 650.0000 mg | Freq: Four times a day (QID) | RECTAL | Status: DC | PRN

## 2014-12-19 MED ORDER — ONDANSETRON HCL 4 MG/2ML IJ SOLN
4.0000 mg | Freq: Four times a day (QID) | INTRAMUSCULAR | Status: DC | PRN
  Administered 2014-12-19: 4 mg via INTRAVENOUS
  Filled 2014-12-19: qty 2

## 2014-12-19 MED ORDER — ATORVASTATIN CALCIUM 40 MG PO TABS
40.0000 mg | ORAL_TABLET | Freq: Every day | ORAL | Status: DC
  Administered 2014-12-19: 40 mg via ORAL
  Filled 2014-12-19: qty 1

## 2014-12-19 MED ORDER — LACTATED RINGERS IV SOLN
INTRAVENOUS | Status: DC
  Administered 2014-12-19 (×4): via INTRAVENOUS

## 2014-12-19 MED ORDER — PROMETHAZINE HCL 25 MG/ML IJ SOLN: 6.2500 mg | INTRAMUSCULAR | Status: DC | PRN

## 2014-12-19 MED ORDER — ACETAMINOPHEN 325 MG PO TABS: 650.0000 mg | ORAL_TABLET | Freq: Four times a day (QID) | ORAL | Status: DC | PRN

## 2014-12-19 MED ORDER — PROPOFOL 10 MG/ML IV BOLUS
INTRAVENOUS | Status: DC | PRN
  Administered 2014-12-19: 170 mg via INTRAVENOUS
  Administered 2014-12-19: 30 mg via INTRAVENOUS

## 2014-12-19 MED ORDER — ENOXAPARIN SODIUM 40 MG/0.4ML ~~LOC~~ SOLN
40.0000 mg | SUBCUTANEOUS | Status: DC
  Administered 2014-12-20: 40 mg via SUBCUTANEOUS
  Filled 2014-12-19: qty 0.4

## 2014-12-19 MED ORDER — SODIUM CHLORIDE 0.9 % IJ SOLN: 9.0000 mL | INTRAMUSCULAR | Status: DC | PRN

## 2014-12-19 MED ORDER — LIDOCAINE HCL (CARDIAC) 20 MG/ML IV SOLN
INTRAVENOUS | Status: AC
  Filled 2014-12-19: qty 5

## 2014-12-19 MED ORDER — MIDAZOLAM HCL 2 MG/2ML IJ SOLN
INTRAMUSCULAR | Status: AC
  Filled 2014-12-19: qty 4

## 2014-12-19 MED ORDER — HYDROMORPHONE 0.3 MG/ML IV SOLN
INTRAVENOUS | Status: DC
  Administered 2014-12-19: 20:00:00 via INTRAVENOUS
  Administered 2014-12-20: 2.54 mg via INTRAVENOUS
  Administered 2014-12-20: 05:00:00 via INTRAVENOUS
  Administered 2014-12-20: 2.7 mg via INTRAVENOUS
  Administered 2014-12-20: 2.1 mg via INTRAVENOUS
  Filled 2014-12-19 (×2): qty 25

## 2014-12-19 MED ORDER — ONDANSETRON HCL 4 MG PO TABS: 4.0000 mg | ORAL_TABLET | Freq: Four times a day (QID) | ORAL | Status: DC | PRN

## 2014-12-19 MED ORDER — 0.9 % SODIUM CHLORIDE (POUR BTL) OPTIME
TOPICAL | Status: DC | PRN
  Administered 2014-12-19: 1000 mL

## 2014-12-19 MED ORDER — BUPROPION HCL ER (XL) 150 MG PO TB24
300.0000 mg | ORAL_TABLET | Freq: Every day | ORAL | Status: DC
  Administered 2014-12-19: 300 mg via ORAL
  Filled 2014-12-19: qty 2

## 2014-12-19 MED ORDER — ONDANSETRON HCL 4 MG/2ML IJ SOLN: 4.0000 mg | Freq: Four times a day (QID) | INTRAMUSCULAR | Status: DC | PRN

## 2014-12-19 SURGICAL SUPPLY — 108 items
BANDAGE ELASTIC 4 VELCRO ST LF (GAUZE/BANDAGES/DRESSINGS) ×5 IMPLANT
BANDAGE ELASTIC 6 VELCRO ST LF (GAUZE/BANDAGES/DRESSINGS) ×5 IMPLANT
BANDAGE ESMARK 6X9 LF (GAUZE/BANDAGES/DRESSINGS) ×3 IMPLANT
BIT DRILL 100X2.5XANTM LCK (BIT) ×1 IMPLANT
BIT DRILL CAL (BIT) ×1 IMPLANT
BIT DRL 100X2.5XANTM LCK (BIT) ×3
BLADE SURG 10 STRL SS (BLADE) ×2 IMPLANT
BLADE SURG 15 STRL LF DISP TIS (BLADE) ×2 IMPLANT
BLADE SURG 15 STRL SS (BLADE)
BLADE SURG ROTATE 9660 (MISCELLANEOUS) ×3 IMPLANT
BNDG CMPR 9X6 STRL LF SNTH (GAUZE/BANDAGES/DRESSINGS) ×3
BNDG COHESIVE 4X5 TAN STRL (GAUZE/BANDAGES/DRESSINGS) ×2 IMPLANT
BNDG COHESIVE 6X5 TAN STRL LF (GAUZE/BANDAGES/DRESSINGS) ×3 IMPLANT
BNDG ESMARK 6X9 LF (GAUZE/BANDAGES/DRESSINGS) ×5
BNDG GAUZE ELAST 4 BULKY (GAUZE/BANDAGES/DRESSINGS) ×5 IMPLANT
BONE VOID FILLER CERAMENT (Orthopedic Implant) ×3 IMPLANT
BRUSH SCRUB DISP (MISCELLANEOUS) ×10 IMPLANT
CANISTER SUCT 3000ML PPV (MISCELLANEOUS) ×5 IMPLANT
CANISTER WOUND CARE 500ML ATS (WOUND CARE) IMPLANT
COVER MAYO STAND STRL (DRAPES) ×2 IMPLANT
COVER SURGICAL LIGHT HANDLE (MISCELLANEOUS) ×7 IMPLANT
CUFF TOURNIQUET SINGLE 24IN (TOURNIQUET CUFF) IMPLANT
CUFF TOURNIQUET SINGLE 34IN LL (TOURNIQUET CUFF) ×3 IMPLANT
DRAPE C-ARM 42X72 X-RAY (DRAPES) ×5 IMPLANT
DRAPE C-ARMOR (DRAPES) ×5 IMPLANT
DRAPE EXTREMITY T 121X128X90 (DRAPE) ×3 IMPLANT
DRAPE INCISE IOBAN 66X45 STRL (DRAPES) ×5 IMPLANT
DRAPE ORTHO SPLIT 77X108 STRL (DRAPES) ×10
DRAPE SURG ORHT 6 SPLT 77X108 (DRAPES) ×6 IMPLANT
DRAPE U-SHAPE 47X51 STRL (DRAPES) ×5 IMPLANT
DRILL BIT 2.5MM (BIT) ×5
DRILL BIT 2.5X100 214235007 (MISCELLANEOUS) ×3 IMPLANT
DRILL BIT 2.7X100 214235006 DU (MISCELLANEOUS) ×3 IMPLANT
DRILL BIT CAL (BIT) ×5
DRSG ADAPTIC 3X8 NADH LF (GAUZE/BANDAGES/DRESSINGS) ×5 IMPLANT
DRSG PAD ABDOMINAL 8X10 ST (GAUZE/BANDAGES/DRESSINGS) ×14 IMPLANT
DRSG VAC ATS LRG SENSATRAC (GAUZE/BANDAGES/DRESSINGS) IMPLANT
DRSG VAC ATS MED SENSATRAC (GAUZE/BANDAGES/DRESSINGS) IMPLANT
DRSG VAC ATS SM SENSATRAC (GAUZE/BANDAGES/DRESSINGS) IMPLANT
ELECT REM PT RETURN 9FT ADLT (ELECTROSURGICAL) ×5
ELECTRODE REM PT RTRN 9FT ADLT (ELECTROSURGICAL) ×3 IMPLANT
EVACUATOR 1/8 PVC DRAIN (DRAIN) IMPLANT
EVACUATOR 3/16  PVC DRAIN (DRAIN)
EVACUATOR 3/16 PVC DRAIN (DRAIN) IMPLANT
GAUZE SPONGE 4X4 12PLY STRL (GAUZE/BANDAGES/DRESSINGS) ×5 IMPLANT
GLOVE BIO SURGEON STRL SZ7.5 (GLOVE) ×5 IMPLANT
GLOVE BIO SURGEON STRL SZ8 (GLOVE) ×5 IMPLANT
GLOVE BIOGEL PI IND STRL 7.5 (GLOVE) ×3 IMPLANT
GLOVE BIOGEL PI IND STRL 8 (GLOVE) ×3 IMPLANT
GLOVE BIOGEL PI INDICATOR 7.5 (GLOVE) ×2
GLOVE BIOGEL PI INDICATOR 8 (GLOVE) ×2
GOWN STRL REUS W/ TWL LRG LVL3 (GOWN DISPOSABLE) ×6 IMPLANT
GOWN STRL REUS W/ TWL XL LVL3 (GOWN DISPOSABLE) ×3 IMPLANT
GOWN STRL REUS W/TWL LRG LVL3 (GOWN DISPOSABLE) ×10
GOWN STRL REUS W/TWL XL LVL3 (GOWN DISPOSABLE) ×5
IMMOBILIZER KNEE 22 UNIV (SOFTGOODS) ×5 IMPLANT
K-WIRE ACE 1.6X6 (WIRE) ×15
KIT BASIN OR (CUSTOM PROCEDURE TRAY) ×5 IMPLANT
KIT ROOM TURNOVER OR (KITS) ×5 IMPLANT
KWIRE ACE 1.6X6 (WIRE) ×3 IMPLANT
MANIFOLD NEPTUNE II (INSTRUMENTS) ×2 IMPLANT
NDL 1/2 CIR CATGUT .05X1.09 (NEEDLE) IMPLANT
NDL SUT 6 .5 CRC .975X.05 MAYO (NEEDLE) IMPLANT
NEEDLE 1/2 CIR CATGUT .05X1.09 (NEEDLE) ×5 IMPLANT
NEEDLE 22X1 1/2 (OR ONLY) (NEEDLE) IMPLANT
NEEDLE MAYO TAPER (NEEDLE)
NS IRRIG 1000ML POUR BTL (IV SOLUTION) ×5 IMPLANT
PACK GENERAL/GYN (CUSTOM PROCEDURE TRAY) ×5 IMPLANT
PACK ORTHO EXTREMITY (CUSTOM PROCEDURE TRAY) ×2 IMPLANT
PAD ARMBOARD 7.5X6 YLW CONV (MISCELLANEOUS) ×10 IMPLANT
PAD CAST 4YDX4 CTTN HI CHSV (CAST SUPPLIES) ×2 IMPLANT
PADDING CAST COTTON 4X4 STRL (CAST SUPPLIES)
PADDING CAST COTTON 6X4 STRL (CAST SUPPLIES) ×2 IMPLANT
PLATE LOCK 5H STD RT PROX TIB (Plate) ×3 IMPLANT
SCREW CORT 3.5X40 815037040 (Screw) ×3 IMPLANT
SCREW CORTICAL 3.5MM  42MM (Screw) ×2 IMPLANT
SCREW CORTICAL 3.5MM 38MM (Screw) ×3 IMPLANT
SCREW CORTICAL 3.5MM 42MM (Screw) ×1 IMPLANT
SCREW LOCK CORT STAR 3.5X70 (Screw) ×3 IMPLANT
SCREW LOCK CORT STAR 3.5X75 (Screw) ×12 IMPLANT
SCREW LOCK CORT STAR 3.5X80 (Screw) ×3 IMPLANT
SCREW LOCK CORT STAR 3.5X85 (Screw) ×3 IMPLANT
SET MONITOR QUICK PRESSURE (MISCELLANEOUS) IMPLANT
SPONGE LAP 18X18 X RAY DECT (DISPOSABLE) ×2 IMPLANT
STAPLER VISISTAT 35W (STAPLE) ×5 IMPLANT
STOCKINETTE IMPERVIOUS 9X36 MD (GAUZE/BANDAGES/DRESSINGS) ×2 IMPLANT
STOCKINETTE IMPERVIOUS LG (DRAPES) ×5 IMPLANT
SUCTION FRAZIER TIP 10 FR DISP (SUCTIONS) ×5 IMPLANT
SUT ETHILON 2 0 FSLX (SUTURE) IMPLANT
SUT ETHILON 3 0 PS 1 (SUTURE) ×9 IMPLANT
SUT PROLENE 0 CT 2 (SUTURE) ×10 IMPLANT
SUT VIC AB 0 CT1 27 (SUTURE) ×5
SUT VIC AB 0 CT1 27XBRD ANBCTR (SUTURE) ×3 IMPLANT
SUT VIC AB 0 CTB1 27 (SUTURE) IMPLANT
SUT VIC AB 1 CT1 27 (SUTURE) ×5
SUT VIC AB 1 CT1 27XBRD ANBCTR (SUTURE) ×3 IMPLANT
SUT VIC AB 2-0 CT1 27 (SUTURE) ×5
SUT VIC AB 2-0 CT1 TAPERPNT 27 (SUTURE) ×5 IMPLANT
SUT VIC AB 2-0 CT3 27 (SUTURE) IMPLANT
SUT VIC AB 2-0 CTB1 (SUTURE) IMPLANT
SYR 20ML ECCENTRIC (SYRINGE) IMPLANT
TOWEL OR 17X24 6PK STRL BLUE (TOWEL DISPOSABLE) ×5 IMPLANT
TOWEL OR 17X26 10 PK STRL BLUE (TOWEL DISPOSABLE) ×10 IMPLANT
TRAY FOLEY CATH 16FRSI W/METER (SET/KITS/TRAYS/PACK) ×3 IMPLANT
TUBE CONNECTING 12'X1/4 (SUCTIONS)
TUBE CONNECTING 12X1/4 (SUCTIONS) ×2 IMPLANT
WIRE K 1.6MM 144256 (MISCELLANEOUS) ×6 IMPLANT
YANKAUER SUCT BULB TIP NO VENT (SUCTIONS) ×2 IMPLANT

## 2014-12-19 NOTE — Progress Notes (Signed)
Receiving report from [facility] at this time

## 2014-12-19 NOTE — Anesthesia Preprocedure Evaluation (Addendum)
Anesthesia Evaluation  Patient identified by MRN, date of birth, ID band Patient awake    Reviewed: Allergy & Precautions, NPO status , Patient's Chart, lab work & pertinent test results  History of Anesthesia Complications Negative for: history of anesthetic complications  Airway Mallampati: II  TM Distance: >3 FB Neck ROM: Full    Dental  (+) Teeth Intact   Pulmonary former smoker,  breath sounds clear to auscultation        Cardiovascular + CAD and + Past MI Rhythm:Regular Rate:Normal     Neuro/Psych    GI/Hepatic negative GI ROS, Neg liver ROS,   Endo/Other  diabetes, Well Controlled  Renal/GU negative Renal ROS     Musculoskeletal  (+) Arthritis -,   Abdominal   Peds  Hematology   Anesthesia Other Findings   Reproductive/Obstetrics                            Anesthesia Physical Anesthesia Plan  ASA: III  Anesthesia Plan: General   Post-op Pain Management:    Induction: Intravenous  Airway Management Planned: Oral ETT  Additional Equipment:   Intra-op Plan:   Post-operative Plan: Extubation in OR  Informed Consent: I have reviewed the patients History and Physical, chart, labs and discussed the procedure including the risks, benefits and alternatives for the proposed anesthesia with the patient or authorized representative who has indicated his/her understanding and acceptance.   Dental advisory given  Plan Discussed with: CRNA and Surgeon  Anesthesia Plan Comments:         Anesthesia Quick Evaluation

## 2014-12-19 NOTE — Anesthesia Procedure Notes (Signed)
Procedure Name: Intubation Date/Time: 12/19/2014 11:54 AM Performed by: Ferol Luz L Pre-anesthesia Checklist: Patient identified, Emergency Drugs available, Suction available, Patient being monitored and Timeout performed Patient Re-evaluated:Patient Re-evaluated prior to inductionOxygen Delivery Method: Circle system utilized Preoxygenation: Pre-oxygenation with 100% oxygen Intubation Type: IV induction Ventilation: Mask ventilation with difficulty Laryngoscope Size: Mac and 3 Grade View: Grade III Tube type: Oral Tube size: 7.5 mm Number of attempts: 1 Airway Equipment and Method: Stylet Placement Confirmation: ETT inserted through vocal cords under direct vision,  positive ETCO2 and breath sounds checked- equal and bilateral Secured at: 22 cm Tube secured with: Tape Dental Injury: Teeth and Oropharynx as per pre-operative assessment  Difficulty Due To: Difficult Airway- due to reduced neck mobility Comments: Mask difficult due to facial hair

## 2014-12-19 NOTE — Brief Op Note (Signed)
12/18/2014 - 12/19/2014  7:37 PM  PATIENT:  Mitchell Hammond  59 y.o. male  PRE-OPERATIVE DIAGNOSIS:   1. RIGHT TIBIAL BICONDYLAR PLATEAU 2. LATERAL MENISCAL TEAR  POST-OPERATIVE DIAGNOSIS:  1. RIGHT TIBIAL BICONDYLAR PLATEAU 2. LATERAL MENISCAL TEAR  PROCEDURE:  Procedure(s): 1. OPEN REDUCTION INTERNAL FIXATION (ORIF) RIGHT TIBIAL PLATEAU (Right) 2. PARTIAL LATERAL MENSICECTOMY 3. ANTERIOR COMPARTMENTAL FASCIOTOMY (Right)  SURGEON:  Surgeon(s) and Role:    * Myrene Galas, MD - Primary  PHYSICIAN ASSISTANT: Montez Morita, PA-C  ANESTHESIA:   general  I/O:     SPECIMEN:  No Specimen  TOURNIQUET:   Total Tourniquet Time Documented: Thigh (Right) - 84 minutes Total: Thigh (Right) - 84 minutes   DICTATION: .[id number]

## 2014-12-19 NOTE — Transfer of Care (Signed)
Immediate Anesthesia Transfer of Care Note  Patient: Mitchell Hammond  Procedure(s) Performed: Procedure(s): OPEN REDUCTION INTERNAL FIXATION (ORIF) RIGHT TIBIAL PLATEAU (Right) ANTERIOR COMPARTMENTAL FASCIOTOMY (Right)  Patient Location: PACU  Anesthesia Type:General  Level of Consciousness: awake, alert , oriented and patient cooperative  Airway & Oxygen Therapy: Patient Spontanous Breathing and Patient connected to nasal cannula oxygen  Post-op Assessment: Report given to RN, Post -op Vital signs reviewed and stable and Patient moving all extremities  Post vital signs: Reviewed and stable  Last Vitals:  Filed Vitals:   12/19/14 1430  BP: 120/61  Pulse: 102  Temp: 37 C  Resp: 15    Complications: No apparent anesthesia complications

## 2014-12-19 NOTE — Anesthesia Postprocedure Evaluation (Signed)
  Anesthesia Post-op Note  Patient: Doctor, general practice  Procedure(s) Performed: Procedure(s): OPEN REDUCTION INTERNAL FIXATION (ORIF) RIGHT TIBIAL PLATEAU (Right) ANTERIOR COMPARTMENTAL FASCIOTOMY (Right)  Patient Location: PACU  Anesthesia Type:General  Level of Consciousness: awake, alert  and oriented  Airway and Oxygen Therapy: Patient Spontanous Breathing  Post-op Pain: mild  Post-op Assessment: Post-op Vital signs reviewed              Post-op Vital Signs: Reviewed  Last Vitals:  Filed Vitals:   12/19/14 1622  BP:   Pulse:   Temp: 36.5 C  Resp:     Complications: No apparent anesthesia complications

## 2014-12-20 ENCOUNTER — Encounter (HOSPITAL_COMMUNITY): Payer: Self-pay | Admitting: Orthopedic Surgery

## 2014-12-20 DIAGNOSIS — Z794 Long term (current) use of insulin: Secondary | ICD-10-CM

## 2014-12-20 DIAGNOSIS — E559 Vitamin D deficiency, unspecified: Secondary | ICD-10-CM | POA: Diagnosis present

## 2014-12-20 DIAGNOSIS — I2510 Atherosclerotic heart disease of native coronary artery without angina pectoris: Secondary | ICD-10-CM | POA: Diagnosis present

## 2014-12-20 DIAGNOSIS — E119 Type 2 diabetes mellitus without complications: Secondary | ICD-10-CM

## 2014-12-20 DIAGNOSIS — G8929 Other chronic pain: Secondary | ICD-10-CM | POA: Diagnosis present

## 2014-12-20 DIAGNOSIS — M549 Dorsalgia, unspecified: Secondary | ICD-10-CM

## 2014-12-20 LAB — BASIC METABOLIC PANEL
ANION GAP: 7 (ref 5–15)
BUN: 11 mg/dL (ref 6–20)
CALCIUM: 8.8 mg/dL — AB (ref 8.9–10.3)
CO2: 27 mmol/L (ref 22–32)
Chloride: 99 mmol/L — ABNORMAL LOW (ref 101–111)
Creatinine, Ser: 0.9 mg/dL (ref 0.61–1.24)
GFR calc Af Amer: >60 mL/min (ref >60)
Glucose, Bld: 174 mg/dL — ABNORMAL HIGH (ref 65–99)
Potassium: 4.2 mmol/L (ref 3.5–5.1)
Sodium: 133 mmol/L — ABNORMAL LOW (ref 135–145)

## 2014-12-20 LAB — CBC
HEMATOCRIT: 37.1 % — AB (ref 39.0–52.0)
Hemoglobin: 12.7 g/dL — ABNORMAL LOW (ref 13.0–17.0)
MCH: 28.7 pg (ref 26.0–34.0)
MCHC: 34.2 g/dL (ref 30.0–36.0)
MCV: 83.9 fL (ref 78.0–100.0)
Platelets: 205 10*3/uL (ref 150–400)
RBC: 4.42 MIL/uL (ref 4.22–5.81)
RDW: 13.5 % (ref 11.5–15.5)
WBC: 9.3 10*3/uL (ref 4.0–10.5)

## 2014-12-20 LAB — GLUCOSE, CAPILLARY
Glucose-Capillary: 166 mg/dL — ABNORMAL HIGH (ref 65–99)
Glucose-Capillary: 190 mg/dL — ABNORMAL HIGH (ref 65–99)
Glucose-Capillary: 146 mg/dL — ABNORMAL HIGH (ref 65–99)

## 2014-12-20 MED ORDER — VITAMIN D (ERGOCALCIFEROL) 1.25 MG (50000 UNIT) PO CAPS
50000.0000 [IU] | ORAL_CAPSULE | ORAL | Status: DC
  Administered 2014-12-20: 50000 [IU] via ORAL
  Filled 2014-12-20 (×2): qty 1

## 2014-12-20 MED ORDER — ASCORBIC ACID 500 MG PO TABS: 500.0000 mg | ORAL_TABLET | Freq: Two times a day (BID) | ORAL | Status: AC

## 2014-12-20 MED ORDER — OXYCODONE HCL 5 MG PO TABS: 5.0000 mg | ORAL_TABLET | Freq: Four times a day (QID) | ORAL | Status: DC | PRN

## 2014-12-20 MED ORDER — METHOCARBAMOL 500 MG PO TABS: 500.0000 mg | ORAL_TABLET | Freq: Four times a day (QID) | ORAL | Status: DC | PRN

## 2014-12-20 MED ORDER — VITAMIN D (ERGOCALCIFEROL) 1.25 MG (50000 UNIT) PO CAPS: 50000.0000 [IU] | ORAL_CAPSULE | ORAL | Status: DC

## 2014-12-20 MED ORDER — HYDROMORPHONE HCL 1 MG/ML IJ SOLN: 0.5000 mg | INTRAMUSCULAR | Status: DC | PRN

## 2014-12-20 MED ORDER — OXYCODONE-ACETAMINOPHEN 5-325 MG PO TABS: 1.0000 | ORAL_TABLET | Freq: Four times a day (QID) | ORAL | Status: DC | PRN

## 2014-12-20 MED ORDER — VITAMIN C 500 MG PO TABS
500.0000 mg | ORAL_TABLET | Freq: Two times a day (BID) | ORAL | Status: DC
  Administered 2014-12-20: 500 mg via ORAL
  Filled 2014-12-20: qty 1

## 2014-12-20 NOTE — Progress Notes (Signed)
Physical Therapy Treatment Patient Details Name: Mitchell Hammond MRN: [id number] DOB: 11-26-1955 Today's Date: 12/20/2014    History of Present Illness Right bicondylar tibial plateau fracture s/p ORIF with partial medial menisectomy     PT Comments    Patient is making good progress with PT.  From a mobility standpoint anticipate patient will be ready for DC home. Patient denies any questions or concerns.       Follow Up Recommendations  Home health PT     Equipment Recommendations  None recommended by PT    Recommendations for Other Services       Precautions / Restrictions Precautions Precautions: None Required Braces or Orthoses: Other Brace/Splint Other Brace/Splint: knee brace unlocked full range Restrictions Weight Bearing Restrictions: Yes RLE Weight Bearing: Non weight bearing    Mobility  Transfers Overall transfer level: Needs assistance Equipment used: Crutches Transfers: Sit to/from Stand Sit to Stand: Modified independent (Device/Increase time)         General transfer comment: toilet transfer with close supervision and use of crutches  Ambulation/Gait Ambulation/Gait assistance: Modified independent (Device/Increase time) Ambulation Distance (Feet): 20 Feet Assistive device: Crutches Gait Pattern/deviations:  (swing through )     General Gait Details: Patient just back from long ambulation with OT, requesting limiting distace due to increasing pain. Safe pattern with ambulation demonstrated.    Stairs Stairs: Yes Stairs assistance: Min guard Stair Management: Two rails Number of Stairs: 5 General stair comments: patient reports feeling confident with stairs  Wheelchair Mobility    Modified Rankin (Stroke Patients Only)       Balance Overall balance assessment: Needs assistance Sitting-balance support: No upper extremity supported Sitting balance-Leahy Scale: Good     Standing balance support: Single extremity supported Standing  balance-Leahy Scale: Poor Standing balance comment: using crutch for standing balance                    Cognition Arousal/Alertness: Awake/alert Behavior During Therapy: WFL for tasks assessed/performed Overall Cognitive Status: Within Functional Limits for tasks assessed                      Exercises      General Comments        Pertinent Vitals/Pain Pain Assessment: 0-10 Pain Score: 7  Pain Location: Rt knee Pain Descriptors / Indicators: Aching Pain Intervention(s): Monitored during session;Limited activity within patient's tolerance    Home Living Family/patient expects to be discharged to:: Private residence Living Arrangements: Spouse/significant other;Children Available Help at Discharge: Family Type of Home: House Home Access: Stairs to enter Entrance Stairs-Rails: Right Home Layout: One level Home Equipment: Crutches;Tub bench      Prior Function Level of Independence: Independent with assistive device(s)          PT Goals (current goals can now be found in the care plan section) Acute Rehab PT Goals Patient Stated Goal: go home today PT Goal Formulation: With patient Time For Goal Achievement: 01/03/15 Potential to Achieve Goals: Good Progress towards PT goals: Progressing toward goals    Frequency  Min 4X/week    PT Plan Current plan remains appropriate    Co-evaluation             End of Session Equipment Utilized During Treatment: Gait belt Activity Tolerance: Patient tolerated treatment well Patient left: in chair;with call bell/phone within reach;with family/visitor present     Time: 6553-7482 PT Time Calculation (min) (ACUTE ONLY): 19 min  Charges:  $Gait Training: 8-22  mins                    G Codes:      Christiane Ha, PT, CSCS Pager [phone] Office [phone]  12/20/2014, 3:04 PM

## 2014-12-20 NOTE — Op Note (Signed)
Mitchell Hammond, Mitchell Hammond               ACCOUNT NO.:  [id number]  MEDICAL RECORD NO.:  [id number]  LOCATION:  [id number]                        FACILITY:  [facility]  PHYSICIAN:  Doralee Albino. Carola Frost, M.D. DATE OF BIRTH:  31-Jan-1956  DATE OF PROCEDURE:  12/19/2014 DATE OF DISCHARGE:                              OPERATIVE REPORT   PREOPERATIVE DIAGNOSES: 1. Right bicondylar tibial plateau. 2. Lateral meniscus tear.  PROCEDURES: 1. Open reduction and internal fixation of right bicondylar tibial     plateau. 2. Anterior compartment fasciotomy. 3. Arthrotomy with partial meniscectomy of the lateral meniscus.  SURGEON:  Doralee Albino. Carola Frost, M.D.  ASSISTANT:  Mearl Latin, PA-C.  ANESTHESIA:  General.  COMPLICATIONS:  None.  TOTAL TOURNIQUET TIME:  1 hour and 23 minutes.  DISPOSITION:  To the PACU.  CONDITION:  Stable.  BRIEF SUMMARY OF INDICATION FOR PROCEDURE:  Mitchell Hammond is a 59 year old male, who sustained a right knee injury in a fall from a ladder at work.  He was seen and evaluated in the office after undergoing CT scan and MRI, which demonstrated bicondylar fracture with a peripheral medial meniscus tear as well as a complex lateral meniscus tear through the posterior horn and the midbody.  I discussed with the patient the risks and benefits of repair including the possibility of nerve injury, vessel injury, infection, DVT, PE, heart attack, stroke, loss of motion, arthritis, symptomatic hardware, malunion, nonunion, need for further surgery among others.  The patient acknowledged these risks and did wish to proceed.  BRIEF SUMMARY OF PROCEDURE:  The patient received 2 g of Ancef preoperatively.  His right lower extremity was prepped and draped in usual sterile fashion.  Tourniquet was not initially inflated until exposure was complete.  A curvilinear incision was made over Gerdy's tubercle.  Dissection was carried down to the IT band proximal to the joint as well as the proximal  tibia.  A transverse arthrotomy was made proximal of the joint line and then going between the joint and the IT band and continued to the brim and elevated the capsule along with the lateral meniscus after insertion of the coronary ligament.  As this was reflected, we could see immediately a tear involving the midbody and anterior horn junction and then as distraction was facilitated by my assistant, we could clearly identify the posterior horn of the meniscus, which was detached nearly entirely through the popliteal falls in the posterior horn.  It was a ragged tear that extended into the midsubstance and anterior body and consequently, it was not repairable. I took a 10-blade and proceeded to perform a tapered cut that did preserve a little bit of the meniscal root posteriorly and well remained along the edge, but no meniscus remained as we added into the popliteal fossa area.  The midportion and midbody tear was tapered using both a 15- blade and a backbiter from the arthroscopy and ENT sets.  This was taken back to the stable tapered edge that could not displace into the joint. There were some small chondral injuries to the femoral condyle where it had pushed through the tibial surface and then matched with the meniscal tear, but these did not  require separate debridements or treatment and were limited in scope.  The articular surface of the tibia was impacted and consisted of four main fragments.  I was able to place a trapdoor in the tibial metaphysis and in stepwise fashion, elevate these different areas of the joint back into a congruent flat surface, this was pinned provisionally with multiple K-wires and then the plate affixed to the side and the [employer] clamp used to compress from medial-to-lateral. Series of Rafter screws were placed initially, it should be noted that prior to placing these Rafter screws and indeed all of the K-wires, I also placed a tenaculum from anterior  to posterior to compress in this direction as well.  The subchondral Rafter screws were placed first and then a distal metaphyseal standard fixation followed by the remainder of the Rafter screws and remainder of the distal fixation, such that, we had seven subchondral Rafter screws and three bicortical shaft screws. Into the defect created by elevation of the articular segments, I placed about 6 mL of calcium phosphate cement and allowed that to harden before deflating the tourniquet.  The tourniquet was inflated after the initial exposure and remained elevated as noted for about an hour and 23 minutes.  The distal edge of the incision was then retracted anteriorly and I took the long scissors spreading superficial to the anterior compartment fascia and deep to it and releasing along its course for complete anterior compartment fasciotomy.  We reduced the risk of postoperative compartment syndrome.  Montez Morita, PA-C assisted me throughout and was necessary for the safe and effective completion of the case to produce the reduction to allow for treatment of meniscal tear and to assist with placement and removal of provisional fixation as well as some definitive fixation and simultaneous wound closure.  PROGNOSIS:  Mitchell Hammond will have unrestricted range of motion of the knee, will be nonweightbearing and will be restarted on his Plavix in few days.  He will be on Lovenox until that time.  He is at increased risk for arthritis given the cartilage injury as well as the meniscal tear, which required a partial meniscectomy.     Doralee Albino. Carola Frost, M.D.     [facility]/MED[facility]  D:  12/19/2014  T:  12/20/2014  Job:  [id number]

## 2014-12-20 NOTE — Progress Notes (Signed)
Orthopedic Tech Progress Note Patient Details:  Cassell Dowell 1955-08-25 [id number]  Patient ID: Jaguar Safko, male   DOB: 10-Jun-1955, 59 y.o.   MRN: [id number] Called in advanced brace order; spoke with Ferdinand Lango, Caron Tardif 12/20/2014, 9:48 AM

## 2014-12-20 NOTE — Progress Notes (Signed)
Occupational Therapy Evaluation Patient Details Name: Mitchell Hammond MRN: [id number] DOB: 23-Apr-1956 Today's Date: 12/20/2014    History of Present Illness Right bicondylar tibial plateau fracture s/p ORIF with partial medial menisectomy    Clinical Impression   Patient presenting with decreased I in self care, decreased safety, decreased dynamic standing balance, and decreased endurance. Patient required assistive devices for independence PTA. Patient currently functioning at supervision - min A overall. OT has provided education to pt and caregiver regarding safety with functional performance.  Follow Up Recommendations  Home health OT    Equipment Recommendations  Other (comment) (recommended long handled reacher and sock aide to increase independence)    Recommendations for Other Services  (none)     Precautions / Restrictions Precautions Required Braces or Orthoses: Other Brace/Splint Other Brace/Splint: knee brace unlocked full range Restrictions Weight Bearing Restrictions: Yes RLE Weight Bearing: Non weight bearing      Mobility Bed Mobility Overal bed mobility: Needs Assistance Bed Mobility: Supine to Sit;Sit to Supine     Supine to sit: Supervision Sit to supine: Supervision      Transfers Overall transfer level: Needs assistance Equipment used: Crutches Transfers: Sit to/from Stand Sit to Stand: Min guard;Supervision         General transfer comment: toilet transfer with close supervision and use of crutches    Balance Overall balance assessment: Needs assistance Sitting-balance support: No upper extremity supported Sitting balance-Leahy Scale: Good     Standing balance support: During functional activity Standing balance-Leahy Scale: Poor Standing balance comment: washing hands at sink while standing with min guard       ADL Overall ADL's : Needs assistance/impaired      General ADL Comments: Pt currently requires min A overall for self  care tasks. Pt has TTB for shower transfer that wife assist him onto prior to this admission. Pt demonstrated the ability to perform toilet transfer with supervision and use of crutches only on standard toilet. OT recommended purchase of long handled reacher and sock aide in order to increase I with LB self care.      Vision Vision Assessment?: No apparent visual deficits   Perception     Praxis      Pertinent Vitals/Pain Pain Assessment: No/denies pain Pain Score: 0-No pain Pain Location: Rt leg Pain Descriptors / Indicators: Aching Pain Intervention(s): Monitored during session;Repositioned     Hand Dominance Right   Extremity/Trunk Assessment Upper Extremity Assessment Upper Extremity Assessment: Overall WFL for tasks assessed   Lower Extremity Assessment Lower Extremity Assessment: Defer to PT evaluation RLE Deficits / Details: limited strength due to pain   Cervical / Trunk Assessment Cervical / Trunk Assessment: Normal   Communication Communication Communication: No difficulties   Cognition Arousal/Alertness: Awake/alert Behavior During Therapy: WFL for tasks assessed/performed Overall Cognitive Status: Within Functional Limits for tasks assessed       General Comments       Exercises       Shoulder Instructions      Home Living Family/patient expects to be discharged to:: Private residence Living Arrangements: Spouse/significant other;Children Available Help at Discharge: Family Type of Home: House Home Access: Stairs to enter Secretary/administrator of Steps: 6 Entrance Stairs-Rails: Right Home Layout: One level     Bathroom Shower/Tub: Tub/shower unit;Curtain Shower/tub characteristics: Engineer, building services: Standard Bathroom Accessibility: Yes How Accessible: Other (comment) (crutches) Home Equipment: Crutches;Tub bench          Prior Functioning/Environment Level of Independence: Independent with assistive device(s)  OT Diagnosis: Generalized weakness   OT Problem List: Decreased strength;Decreased activity tolerance;Decreased safety awareness   OT Treatment/Interventions:      OT Goals(Current goals can be found in the care plan section) Acute Rehab OT Goals Patient Stated Goal: go home today OT Goal Formulation: All assessment and education complete, DC therapy  OT Frequency:     Barriers to D/C:            Co-evaluation              End of Session Equipment Utilized During Treatment: Other (comment) (crutches)  Activity Tolerance: Patient tolerated treatment well Patient left: in chair;with call bell/phone within reach;with family/visitor present   Time: 1341-1355 OT Time Calculation (min): 14 min Charges:  OT General Charges $OT Visit: 1 Procedure OT Evaluation $Initial OT Evaluation Tier I: 1 Procedure G-Codes:    Lowella Grip 2015-01-18, 2:05 PM

## 2014-12-20 NOTE — Progress Notes (Signed)
Orthopaedic Trauma Service Progress Note  Subjective  Doing ok Pain tolerable now PCA helping   Review of Systems  Constitutional: Negative for fever and chills.  Respiratory: Negative for shortness of breath and wheezing.   Cardiovascular: Negative for chest pain and palpitations.  Gastrointestinal: Negative for nausea, vomiting and abdominal pain.  Genitourinary: Negative for dysuria.  Neurological: Negative for tingling and sensory change.     Objective   BP 127/70 mmHg  Pulse 107  Temp(Src) 98.5 F (36.9 C) (Oral)  Resp 20  SpO2 96%  Intake/Output      08/02 0701 - 08/03 0700 08/03 0701 - 08/04 0700   P.O. 480    I.V. 2200    Total Intake 2680     Urine 3050    Blood 100    Total Output 3150     Net -470            Labs  CBG (last 3)   Recent Labs  12/19/14 1656 12/19/14 2122 12/20/14 0638  GLUCAP 189* 160* 166*    Results for Tolbert, Unnamed (MRN [id number]) as of 12/20/2014 10:06  Ref. Range 12/20/2014 06:38  Glucose-Capillary Latest Ref Range: 65-99 mg/dL 166 (H)  Sodium Latest Ref Range: 135-145 mmol/L 133 (L)  Potassium Latest Ref Range: 3.5-5.1 mmol/L 4.2  Chloride Latest Ref Range: 101-111 mmol/L 99 (L)  CO2 Latest Ref Range: 22-32 mmol/L 27  BUN Latest Ref Range: 6-20 mg/dL 11  Creatinine Latest Ref Range: 0.61-1.24 mg/dL 0.90  Calcium Latest Ref Range: 8.9-10.3 mg/dL 8.8 (L)  EGFR (Non-African Amer.) Latest Ref Range: >60 mL/min >60  EGFR (African American) Latest Ref Range: >60 mL/min >60  Glucose Latest Ref Range: 65-99 mg/dL 174 (H)  Anion gap Latest Ref Range: 5-15  7  WBC Latest Ref Range: 4.0-10.5 K/uL 9.3  RBC Latest Ref Range: 4.22-5.81 MIL/uL 4.42  Hemoglobin Latest Ref Range: 13.0-17.0 g/dL 12.7 (L)  HCT Latest Ref Range: 39.0-52.0 % 37.1 (L)  MCV Latest Ref Range: 78.0-100.0 fL 83.9  MCH Latest Ref Range: 26.0-34.0 pg 28.7  MCHC Latest Ref Range: 30.0-36.0 g/dL 34.2  RDW Latest Ref Range: 11.5-15.5 % 13.5  Platelets Latest  Ref Range: 150-400 K/uL 205  Results for Barajas, Tayte (MRN [id number]) as of 12/20/2014 10:06  Ref. Range 12/18/2014 11:46  Vit D, 25-Hydroxy Latest Ref Range: 30.0-100.0 ng/mL 22.8 (L)  Results for Orvis, [place] (MRN [id number]) as of 12/20/2014 10:06  Ref. Range 12/18/2014 11:46  Hemoglobin A1C Latest Ref Range: 4.8-5.6 % 7.6 (H)    Exam  Gen: Awake and alert, NAD, appears comfortable  Lungs: clear anterior fields Cardiac: RRR, s1 and s2 Abd: + BS, NTND Ext:       Right Lower Extremity   Dressing c/d/i  Swelling controlled  EHL, FHL, AT, PT, peroneals gastroc motor intact  DPN, SPN, TN sensation intact  Ext warm   + DP pulse  No DCT   Compartments soft    Assessment and Plan   POD/HD#: 1  59-year-old male with numerous medical comorbidities with right bicondylar tibial plateau fracture  1. Fall off a ladder  2. Right bicondylar tibial plateau fracture s/p ORIF with partial medial menisectomy   NWB x 8 weeks  Unrestricted R knee ROM  Ice and elevate  PT/OT evals  Hinged knee brace  No resting with pillow under bend of knee, place under ankle to elevate leg   Dressing change tomorrow              3.Pain management:             Percocet, OxyIR and Robaxin as needed  Dc pca today   4.ABL anemia/Hemodynamics             stable   5.Medical issues        Cardiac:  Restart plavix in 3 days  lovenox in interim        Endocrine            diabetes   HgbA1c subotimal-  Poor control    DM care coordinator consult     Continue with current meds and adjust accordingly     Change SSI to resistant scale                6. DVT/PE prophylaxis:             Lovenox             Will restart Plavix in 3 more days   7. ID:               Perioperative antibiotics  8. Metabolic Bone Disease:             Metabolic bone workup pending  Vitamin D insufficient    Supplement   9. Activity:             up with therapies  NWB R leg  ROM R Knee as tolerated   10.  FEN/Foley/Lines:             Carb modified diet             Npo after midnight  11. Impediments to fracture healing:             Diabetes              12. Dispo:             anticipate dc home tomorrow     Keith W. Paul, PA-C [facility] [phone] (O) 12/20/2014 9:06 AM  

## 2014-12-20 NOTE — Discharge Instructions (Signed)
[facility] Discharge Instructions   General Discharge Instructions  WEIGHT BEARING STATUS: Nonweightbearing right leg  RANGE OF MOTION/ACTIVITY: Unrestricted range of motion right knee  PAIN MEDICATION USE AND EXPECTATIONS  You have likely been given narcotic medications to help control your pain.  After a traumatic event that results in an fracture (broken bone) with or without surgery, it is ok to use narcotic pain medications to help control one's pain.  We understand that everyone responds to pain differently and each individual patient will be evaluated on a regular basis for the continued need for narcotic medications. Ideally, narcotic medication use should last no more than 6-8 weeks (coinciding with fracture healing).   As a patient it is your responsibility as well to monitor narcotic medication use and report the amount and frequency you use these medications when you come to your office visit.   We would also advise that if you are using narcotic medications, you should take a dose prior to therapy to maximize you participation.  IF YOU ARE ON NARCOTIC MEDICATIONS IT IS NOT PERMISSIBLE TO OPERATE A MOTOR VEHICLE (MOTORCYCLE/CAR/TRUCK/MOPED) OR HEAVY MACHINERY DO NOT MIX NARCOTICS WITH OTHER CNS (CENTRAL NERVOUS SYSTEM) DEPRESSANTS SUCH AS ALCOHOL  Diet: as you were eating previously.  Can use over the counter stool softeners and bowel preparations, such as Miralax, to help with bowel movements.  Narcotics can be constipating.  Be sure to drink plenty of fluids  Wound Care: Daily dressing changes starting on 12/22/2014. See instructions below  STOP SMOKING OR USING NICOTINE PRODUCTS!!!!  As discussed nicotine severely impairs your body's ability to heal surgical and traumatic wounds but also impairs bone healing.  Wounds and bone heal by forming microscopic blood vessels (angiogenesis) and nicotine is a vasoconstrictor (essentially, shrinks blood vessels).  Therefore,  if vasoconstriction occurs to these microscopic blood vessels they essentially disappear and are unable to deliver necessary nutrients to the healing tissue.  This is one modifiable factor that you can do to dramatically increase your chances of healing your injury.    (This means no smoking, no nicotine gum, patches, etc)  DO NOT USE NONSTEROIDAL ANTI-INFLAMMATORY DRUGS (NSAID'S)  Using products such as Advil (ibuprofen), Aleve (naproxen), Motrin (ibuprofen) for additional pain control during fracture healing can delay and/or prevent the healing response.  If you would like to take over the counter (OTC) medication, Tylenol (acetaminophen) is ok.  However, some narcotic medications that are given for pain control contain acetaminophen as well. Therefore, you should not exceed more than 4000 mg of tylenol in a day if you do not have liver disease.  Also note that there are may OTC medicines, such as cold medicines and allergy medicines that my contain tylenol as well.  If you have any questions about medications and/or interactions please ask your doctor/PA or your pharmacist.      ICE AND ELEVATE INJURED/OPERATIVE EXTREMITY  Using ice and elevating the injured extremity above your heart can help with swelling and pain control.  Icing in a pulsatile fashion, such as 20 minutes on and 20 minutes off, can be followed.    Do not place ice directly on skin. Make sure there is a barrier between to skin and the ice pack.    Using frozen items such as frozen peas works well as the conform nicely to the are that needs to be iced.  USE AN ACE WRAP OR TED HOSE FOR SWELLING CONTROL  In addition to icing and elevation, Ace wraps  or TED hose are used to help limit and resolve swelling.  It is recommended to use Ace wraps or TED hose until you are informed to stop.    When using Ace Wraps start the wrapping distally (farthest away from the body) and wrap proximally (closer to the body)   Example: If you had surgery  on your leg or thing and you do not have a splint on, start the ace wrap at the toes and work your way up to the thigh        If you had surgery on your upper extremity and do not have a splint on, start the ace wrap at your fingers and work your way up to the upper arm  IF YOU ARE IN A SPLINT OR CAST DO NOT REMOVE IT FOR ANY REASON   If your splint gets wet for any reason please contact the office immediately. You may shower in your splint or cast as long as you keep it dry.  This can be done by wrapping in a cast cover or garbage back (or similar)  Do Not stick any thing down your splint or cast such as pencils, money, or hangers to try and scratch yourself with.  If you feel itchy take benadryl as prescribed on the bottle for itching  IF YOU ARE IN A CAM BOOT (BLACK BOOT)  You may remove boot periodically. Perform daily dressing changes as noted below.  Wash the liner of the boot regularly and wear a sock when wearing the boot. It is recommended that you sleep in the boot until told otherwise  CALL THE OFFICE WITH ANY QUESTIONS OR CONCERTS: [phone]     Discharge Pin Site Instructions  Dress pins daily with Kerlix roll starting on POD 2. Wrap the Kerlix so that it tamps the skin down around the pin-skin interface to prevent/limit motion of the skin relative to the pin.  (Pin-skin motion is the primary cause of pain and infection related to external fixator pin sites).  Remove any crust or coagulum that may obstruct drainage with a saline moistened gauze or soap and water.  After POD 3, if there is no discernable drainage on the pin site dressing, the interval for change can by increased to every other day.  You may shower with the fixator, cleaning all pin sites gently with soap and water.  If you have a surgical wound this needs to be completely dry and without drainage before showering.  The extremity can be lifted by the fixator to facilitate wound care and transfers.  Notify the  office/Doctor if you experience increasing drainage, redness, or pain from a pin site, or if you notice purulent (thick, snot-like) drainage.  Discharge Wound Care Instructions  Do NOT apply any ointments, solutions or lotions to pin sites or surgical wounds.  These prevent needed drainage and even though solutions like hydrogen peroxide kill bacteria, they also damage cells lining the pin sites that help fight infection.  Applying lotions or ointments can keep the wounds moist and can cause them to breakdown and open up as well. This can increase the risk for infection. When in doubt call the office.  Surgical incisions should be dressed daily.  If any drainage is noted, use one layer of adaptic, then gauze, Kerlix, and an ace wrap.  Once the incision is completely dry and without drainage, it may be left open to air out.  Showering may begin 36-48 hours later.  Cleaning gently with soap and  water.  Traumatic wounds should be dressed daily as well.    One layer of adaptic, gauze, Kerlix, then ace wrap.  The adaptic can be discontinued once the draining has ceased    If you have a wet to dry dressing: wet the gauze with saline the squeeze as much saline out so the gauze is moist (not soaking wet), place moistened gauze over wound, then place a dry gauze over the moist one, followed by Kerlix wrap, then ace wrap.

## 2014-12-20 NOTE — Discharge Summary (Signed)
Orthopaedic Trauma Service (OTS)  Patient ID: Mitchell Hammond MRN: [id number] DOB/AGE: 06-24-1955 59 y.o.  Admit date: 12/18/2014 Discharge date: 12/20/2014  Admission Diagnoses: Right tibial plateau fracture  IDDM CAD Chronic back pain   Discharge Diagnoses:  Principal Problem:   Tibial plateau fracture Active Problems:   Fracture, tibial plateau   IDDM (insulin dependent diabetes mellitus)   CAD (coronary artery disease)   Chronic back pain   Vitamin D insufficiency   Procedures Performed:  12/19/2014- Dr. Marcelino Hammond 1. Open reduction and internal fixation of right bicondylar tibial     plateau. 2. Anterior compartment fasciotomy. 3. Arthrotomy with partial meniscectomy of the lateral meniscus   Discharged Condition: good  Hospital Course:   Patient is a 59 year old male who sustained a right tibial plateau fracture around the second week of July. He was initially seen and evaluated at [place] and sports medicine and due to the complexity of his injury he was referred to the orthopedic trauma specialists. Patient was seen and evaluated on 12/13/2014. His fracture is felt to be cooperative. Patient was taken to the OR on 12/19/2014 for the procedure described up above. Of note the patient does have a fairly extensive cardiac history, then about a year since his last cardiac you out. We did attempt to have the patient seen by his cardiologist preoperatively however this was not feasible from a logistic standpoint. As such patient was admitted for surgery and evaluated by our own cardiology service. EKG was reviewed, no additional interventions were taken. Patient was deemed intermediate to moderate risk for surgery. Surgery was performed on 12/19/2014. Patient did well from surgery was transferred to the orthopedic floor for continued observation, pain control and to begin therapies. On postoperative day #1 patient was doing fantastic his pain was well-controlled and  transitioned quickly to oral pain medication. He worked very well with physical therapy and was placed into a hinged knee brace. Patient was doing so well on postoperative day #1 and he wished to be discharged home. Patient was discharged home in stable condition on 12/20/2014   We did initiate a metabolic bone workup given his concomitant diabetes. His hemoglobin A1c is greater than 7. His sugars during his hospital stay were in the high 100s as well. He was maintained on his home medications as well as sliding scale. We did discuss the impact of his uncontrolled diabetes has on wound and bone healing. 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   Patient is on chronic Plavix and hold perioperatively and this will be started on postoperative day 4. Patient was maintained on his low-dose aspirin as well as Lovenox during his perioperative period.  Consults: cardiology  Significant Diagnostic Studies: labs:   Results for Mitchell, Hammond (MRN [id number]) as of 12/22/2014 12:03  Ref. Range 12/20/2014 06:38  Glucose-Capillary Latest Ref Range: 65-99 mg/dL 166 (H)  Sodium Latest Ref Range: 135-145 mmol/L 133 (L)  Potassium Latest Ref Range: 3.5-5.1 mmol/L 4.2  Chloride Latest Ref Range: 101-111 mmol/L 99 (L)  CO2 Latest Ref Range: 22-32 mmol/L 27  BUN Latest Ref Range: 6-20 mg/dL 11  Creatinine Latest Ref Range: 0.61-1.24 mg/dL 0.90  Calcium Latest Ref Range: 8.9-10.3 mg/dL 8.8 (L)  EGFR (Non-African Amer.) Latest Ref Range: >60 mL/min >60  EGFR (African American) Latest Ref Range: >60 mL/min >60  Glucose Latest Ref Range: 65-99 mg/dL 174 (H)  Anion gap  Latest Ref Range: 5-15  7  WBC Latest Ref Range: 4.0-10.5 K/uL 9.3  RBC Latest Ref Range: 4.22-5.81 MIL/uL 4.42  Hemoglobin Latest Ref Range: 13.0-17.0 g/dL 12.7 (L)  HCT Latest Ref Range: 39.0-52.0 % 37.1 (L)  MCV Latest Ref Range: 78.0-100.0 fL 83.9  MCH  Latest Ref Range: 26.0-34.0 pg 28.7  MCHC Latest Ref Range: 30.0-36.0 g/dL 34.2  RDW Latest Ref Range: 11.5-15.5 % 13.5  Platelets Latest Ref Range: 150-400 K/uL 205   Results for Mitchell, Hammond (MRN [id number]) as of 12/22/2014 12:03  Ref. Range 12/18/2014 11:46  Vit D, 25-Hydroxy Latest Ref Range: 30.0-100.0 ng/mL 22.8 (L)  Vitamin D 1, 25 (OH) Total Latest Units: pg/mL 37  Vitamin D2 1, 25 (OH) Latest Units: pg/mL <10  Vitamin D3 1, 25 (OH) Latest Units: pg/mL 36   Results for Mitchell, Hammond (MRN [id number]) as of 12/22/2014 12:03  Ref. Range 12/18/2014 11:46  Hemoglobin A1C Latest Ref Range: 4.8-5.6 % 7.6 (H)   Results for Mitchell, Hammond (MRN [id number]) as of 12/22/2014 12:03  Ref. Range 12/18/2014 11:46  TSH Latest Ref Range: 0.350-4.500 uIU/mL 2.829   Treatments: IV hydration, antibiotics: Ancef, analgesia: Dilaudid and Percocet and OxyIR, cardiac meds: metoprolol, anticoagulation: LMW heparin, insulin: NPH and NovoLog sliding scale  therapies: PT, OT and RN and surgery as above  Discharge Exam:        Orthopaedic Trauma Service Progress Note  Subjective  Doing ok Pain tolerable now PCA helping   Review of Systems  Constitutional: Negative for fever and chills.  Respiratory: Negative for shortness of breath and wheezing.   Cardiovascular: Negative for chest pain and palpitations.  Gastrointestinal: Negative for nausea, vomiting and abdominal pain.  Genitourinary: Negative for dysuria.  Neurological: Negative for tingling and sensory change.     Objective   BP 127/70 mmHg  Pulse 107  Temp(Src) 98.5 F (36.9 C) (Oral)  Resp 20  SpO2 96%  Intake/Output       08/02 0701 - 08/03 0700 08/03 0701 - 08/04 0700    P.O. 480     I.V. 2200     Total Intake 2680      Urine 3050     Blood 100     Total Output 3150      Net -470              Labs  CBG (last 3)    Recent Labs (last 2 labs)      Recent Labs   12/19/14 1656  12/19/14 2122  12/20/14 0638   GLUCAP  189*   160*  166*       Results for Mitchell, Hammond (MRN [id number]) as of 12/20/2014 10:06   Ref. Range  12/20/2014 06:38   Glucose-Capillary  Latest Ref Range: 65-99 mg/dL  166 (H)   Sodium  Latest Ref Range: 135-145 mmol/L  133 (L)   Potassium  Latest Ref Range: 3.5-5.1 mmol/L  4.2   Chloride  Latest Ref Range: 101-111 mmol/L  99 (L)   CO2  Latest Ref Range: 22-32 mmol/L  27   BUN  Latest Ref Range: 6-20 mg/dL  11   Creatinine  Latest Ref Range: 0.61-1.24 mg/dL  0.90   Calcium  Latest Ref Range: 8.9-10.3 mg/dL  8.8 (L)   EGFR (Non-African Amer.)  Latest Ref Range: >60 mL/min  >60   EGFR (African American)  Latest Ref Range: >60 mL/min  >60   Glucose  Latest Ref Range: 65-99 mg/dL  174 (H)  Anion gap  Latest Ref Range: 5-15   7   WBC  Latest Ref Range: 4.0-10.5 K/uL  9.3   RBC  Latest Ref Range: 4.22-5.81 MIL/uL  4.42   Hemoglobin  Latest Ref Range: 13.0-17.0 g/dL  12.7 (L)   HCT  Latest Ref Range: 39.0-52.0 %  37.1 (L)   MCV  Latest Ref Range: 78.0-100.0 fL  83.9   MCH  Latest Ref Range: 26.0-34.0 pg  28.7   MCHC  Latest Ref Range: 30.0-36.0 g/dL  34.2   RDW  Latest Ref Range: 11.5-15.5 %  13.5   Platelets  Latest Ref Range: 150-400 K/uL  205   Results for ONYX, SCHIRMER (MRN [id number]) as of 12/20/2014 10:06   Ref. Range  12/18/2014 11:46   Vit D, 25-Hydroxy  Latest Ref Range: 30.0-100.0 ng/mL  22.8 (L)   Results for DAY, DEERY (MRN [id number]) as of 12/20/2014 10:06   Ref. Range  12/18/2014 11:46   Hemoglobin A1C  Latest Ref Range: 4.8-5.6 %  7.6 (H)     Exam  Gen: Awake and alert, NAD, appears comfortable   Lungs: clear anterior fields Cardiac: RRR, s1 and s2 Abd: + BS, NTND Ext:        Right Lower Extremity               Dressing c/d/i             Swelling controlled             EHL, FHL, AT, PT, peroneals gastroc motor intact             DPN, SPN, TN sensation intact             Ext warm               + DP pulse             No DCT               Compartments soft     Assessment and Plan   POD/HD#: 75  59 year old male with numerous medical comorbidities with right bicondylar tibial plateau fracture  1. Fall off a ladder  2. Right bicondylar tibial plateau fracture s/p ORIF with partial medial menisectomy               NWB x 8 weeks             Unrestricted R knee ROM             Ice and elevate             PT/OT evals             Hinged knee brace             No resting with pillow under bend of knee, place under ankle to elevate leg               Dressing change tomorrow             3.Pain management:             Percocet, OxyIR and Robaxin as needed             Dc pca today   4.ABL anemia/Hemodynamics             stable   5.Medical issues        Cardiac:             Restart plavix in 3 days  lovenox in interim        Endocrine            diabetes                         HgbA1c subotimal-  Poor control                                     DM care coordinator consult                                       Continue with current meds and adjust accordingly                                       Change SSI to resistant scale                 6. DVT/PE prophylaxis:             Lovenox             Will restart Plavix in 3 more days   7. ID:               Perioperative antibiotics  8. Metabolic Bone Disease:             Metabolic bone workup pending             Vitamin D insufficient                           Supplement   9. Activity:             up with therapies             NWB R leg             ROM R Knee as tolerated   10. FEN/Foley/Lines:             Carb modified diet             Npo after midnight  11. Impediments to fracture healing:             Diabetes              12. Dispo:             discharge home   Disposition: home   Discharge Instructions    Active range of motion    Complete by:  As directed   Unrestricted range of motion right knee     Call MD / Call 911    Complete by:  As directed    If you experience chest pain or shortness of breath, CALL 911 and be transported to the hospital emergency room.  If you develope a fever above 101 F, pus (white drainage) or increased drainage or redness at the wound, or calf pain, call your surgeon's office.     Constipation Prevention    Complete by:  As directed   Drink plenty of fluids.  Prune juice may be helpful.  You may use a stool softener, such as Colace (over the counter) 100 mg twice a day.  Use MiraLax (over the counter) for constipation as needed.  Diet Carb Modified    Complete by:  As directed      Discharge instructions    Complete by:  As directed   Orthopaedic Trauma Service Discharge Instructions   General Discharge Instructions  WEIGHT BEARING STATUS: Nonweightbearing right leg  RANGE OF MOTION/ACTIVITY: Unrestricted range of motion right knee  PAIN MEDICATION USE AND EXPECTATIONS  You have likely been given narcotic medications to help control your pain.  After a traumatic event that results in an fracture (broken bone) with or without surgery, it is ok to use narcotic pain medications to help control one's pain.  We understand that everyone responds to pain differently and each individual patient will be evaluated on a regular basis for the continued need for narcotic medications. Ideally, narcotic medication use should last no more than 6-8 weeks (coinciding with fracture healing).   As a patient it is your responsibility as well to monitor narcotic medication use and report the amount and frequency you use these medications when you come to your office visit.   We would also advise that if you are using narcotic medications, you should take a dose prior to therapy to maximize you participation.  IF YOU ARE ON NARCOTIC MEDICATIONS IT IS NOT PERMISSIBLE TO OPERATE A MOTOR VEHICLE (MOTORCYCLE/CAR/TRUCK/MOPED) OR HEAVY MACHINERY DO NOT MIX NARCOTICS WITH OTHER CNS (CENTRAL NERVOUS SYSTEM) DEPRESSANTS SUCH AS  ALCOHOL  Diet: as you were eating previously.  Can use over the counter stool softeners and bowel preparations, such as Miralax, to help with bowel movements.  Narcotics can be constipating.  Be sure to drink plenty of fluids  Wound Care: Daily dressing changes starting on 12/22/2014. See instructions below  STOP SMOKING OR USING NICOTINE PRODUCTS!!!!  As discussed nicotine severely impairs your body's ability to heal surgical and traumatic wounds but also impairs bone healing.  Wounds and bone heal by forming microscopic blood vessels (angiogenesis) and nicotine is a vasoconstrictor (essentially, shrinks blood vessels).  Therefore, if vasoconstriction occurs to these microscopic blood vessels they essentially disappear and are unable to deliver necessary nutrients to the healing tissue.  This is one modifiable factor that you can do to dramatically increase your chances of healing your injury.    (This means no smoking, no nicotine gum, patches, etc)  DO NOT USE NONSTEROIDAL ANTI-INFLAMMATORY DRUGS (NSAID'S)  Using products such as Advil (ibuprofen), Aleve (naproxen), Motrin (ibuprofen) for additional pain control during fracture healing can delay and/or prevent the healing response.  If you would like to take over the counter (OTC) medication, Tylenol (acetaminophen) is ok.  However, some narcotic medications that are given for pain control contain acetaminophen as well. Therefore, you should not exceed more than 4000 mg of tylenol in a day if you do not have liver disease.  Also note that there are may OTC medicines, such as cold medicines and allergy medicines that my contain tylenol as well.  If you have any questions about medications and/or interactions please ask your doctor/PA or your pharmacist.      ICE AND ELEVATE INJURED/OPERATIVE EXTREMITY  Using ice and elevating the injured extremity above your heart can help with swelling and pain control.  Icing in a pulsatile fashion, such as 20  minutes on and 20 minutes off, can be followed.    Do not place ice directly on skin. Make sure there is a barrier between to skin and the ice pack.    Using frozen items such as frozen peas works well as the conform  nicely to the are that needs to be iced.  USE AN ACE WRAP OR TED HOSE FOR SWELLING CONTROL  In addition to icing and elevation, Ace wraps or TED hose are used to help limit and resolve swelling.  It is recommended to use Ace wraps or TED hose until you are informed to stop.    When using Ace Wraps start the wrapping distally (farthest away from the body) and wrap proximally (closer to the body)   Example: If you had surgery on your leg or thing and you do not have a splint on, start the ace wrap at the toes and work your way up to the thigh        If you had surgery on your upper extremity and do not have a splint on, start the ace wrap at your fingers and work your way up to the upper arm  IF YOU ARE IN A SPLINT OR CAST DO NOT [place]   If your splint gets wet for any reason please contact the office immediately. You may shower in your splint or cast as long as you keep it dry.  This can be done by wrapping in a cast cover or garbage back (or similar)  Do Not stick any thing down your splint or cast such as pencils, money, or hangers to try and scratch yourself with.  If you feel itchy take benadryl as prescribed on the bottle for itching  IF YOU ARE IN A CAM BOOT (BLACK BOOT)  You may remove boot periodically. Perform daily dressing changes as noted below.  Wash the liner of the boot regularly and wear a sock when wearing the boot. It is recommended that you sleep in the boot until told otherwise  CALL THE OFFICE WITH ANY QUESTIONS OR CONCERTS: [phone]     Discharge Pin Site Instructions  Dress pins daily with Kerlix roll starting on POD 2. Wrap the Kerlix so that it tamps the skin down around the pin-skin interface to prevent/limit motion of the skin  relative to the pin.  (Pin-skin motion is the primary cause of pain and infection related to external fixator pin sites).  Remove any crust or coagulum that may obstruct drainage with a saline moistened gauze or soap and water.  After POD 3, if there is no discernable drainage on the pin site dressing, the interval for change can by increased to every other day.  You may shower with the fixator, cleaning all pin sites gently with soap and water.  If you have a surgical wound this needs to be completely dry and without drainage before showering.  The extremity can be lifted by the fixator to facilitate wound care and transfers.  Notify the office/Doctor if you experience increasing drainage, redness, or pain from a pin site, or if you notice purulent (thick, snot-like) drainage.  Discharge Wound Care Instructions  Do NOT apply any ointments, solutions or lotions to pin sites or surgical wounds.  These prevent needed drainage and even though solutions like hydrogen peroxide kill bacteria, they also damage cells lining the pin sites that help fight infection.  Applying lotions or ointments can keep the wounds moist and can cause them to breakdown and open up as well. This can increase the risk for infection. When in doubt call the office.  Surgical incisions should be dressed daily.  If any drainage is noted, use one layer of adaptic, then gauze, Kerlix, and an ace wrap.  Once the  incision is completely dry and without drainage, it may be left open to air out.  Showering may begin 36-48 hours later.  Cleaning gently with soap and water.  Traumatic wounds should be dressed daily as well.    One layer of adaptic, gauze, Kerlix, then ace wrap.  The adaptic can be discontinued once the draining has ceased    If you have a wet to dry dressing: wet the gauze with saline the squeeze as much saline out so the gauze is moist (not soaking wet), place moistened gauze over wound, then place a dry gauze  over the moist one, followed by Kerlix wrap, then ace wrap.     Do not put a pillow under the knee. Place it under the heel.    Complete by:  As directed      Driving restrictions    Complete by:  As directed   No driving     Increase activity slowly as tolerated    Complete by:  As directed      Non weight bearing    Complete by:  As directed   Laterality:  right  Extremity:  Lower            Medication List    STOP taking these medications        enoxaparin 40 MG/0.4ML injection  Commonly known as:  LOVENOX      TAKE these medications        ascorbic acid 500 MG tablet  Commonly known as:  VITAMIN C  Take 1 tablet (500 mg total) by mouth 2 (two) times daily.     aspirin EC 81 MG tablet  Take 81 mg by mouth daily.     atorvastatin 40 MG tablet  Commonly known as:  LIPITOR  Take 40 mg by mouth daily at 6 PM.     buPROPion 300 MG 24 hr tablet  Commonly known as:  WELLBUTRIN XL  Take 300 mg by mouth at bedtime.     clopidogrel 75 MG tablet  Commonly known as:  PLAVIX  Take 75 mg by mouth daily.     gabapentin 300 MG capsule  Commonly known as:  NEURONTIN  Take 300 mg by mouth 3 (three) times daily.     gemfibrozil 600 MG tablet  Commonly known as:  LOPID  Take 600 mg by mouth 2 (two) times daily before a meal.     insulin NPH-regular Human (70-30) 100 UNIT/ML injection  Commonly known as:  NOVOLIN 70/30  Inject 25 Units into the skin 2 (two) times daily with a meal.     losartan 50 MG tablet  Commonly known as:  COZAAR  Take 50 mg by mouth daily.     metFORMIN 1000 MG tablet  Commonly known as:  GLUCOPHAGE  Take 1,000 mg by mouth 2 (two) times daily with a meal.     methocarbamol 500 MG tablet  Commonly known as:  ROBAXIN  Take 1-2 tablets (500-1,000 mg total) by mouth every 6 (six) hours as needed for muscle spasms.     metoprolol succinate 25 MG 24 hr tablet  Commonly known as:  TOPROL-XL  Take 25 mg by mouth daily.     nitroGLYCERIN 0.4 MG SL  tablet  Commonly known as:  NITROSTAT  Place 0.4 mg under the tongue every 5 (five) minutes as needed for chest pain.     oxyCODONE 5 MG immediate release tablet  Commonly known as:  Oxy IR/ROXICODONE  Take  1-2 tablets (5-10 mg total) by mouth every 6 (six) hours as needed for moderate pain or breakthrough pain (take between percocet for breakthrough pain).     oxyCODONE-acetaminophen 5-325 MG per tablet  Commonly known as:  PERCOCET/ROXICET  Take 1-2 tablets by mouth every 6 (six) hours as needed for moderate pain or severe pain.     Vitamin D (Ergocalciferol) 50000 UNITS Caps capsule  Commonly known as:  DRISDOL  Take 1 capsule (50,000 Units total) by mouth every 7 (seven) days.           Follow-up Information    Follow up with HANDY,MICHAEL H, MD. Schedule an appointment as soon as possible for a visit in 2 weeks.   Specialty:  Orthopedic Surgery   Why:  For wound re-check, For suture removal   Contact information:   [address] [place] [address] [phone]       Follow up with [facility] R, MD In 2 weeks.   Specialty:  Cardiology   Contact information:   [address] [place] [address] [phone]       Follow up with LAND, PHILLIP, PA-C In 2 weeks.   Specialty:  Physician Assistant   Why:  review DM meds    Contact information:   [place] [address] [phone]       Discharge Instructions and Plan:  58 year old male with numerous medical comorbidities with right bicondylar tibial plateau fracture  1. Fall off a ladder  2. Right bicondylar tibial plateau fracture s/p ORIF with partial medial menisectomy               NWB x 8 weeks             Unrestricted R knee ROM             Ice and elevate             PT/OT evals             Hinged knee brace             No resting with pillow under bend of knee, place under ankle to elevate leg               Dressing change tomorrow              3.Pain management:             Percocet, OxyIR and Robaxin as needed               4.ABL anemia/Hemodynamics             stable   5.Medical issues        Cardiac:             Restart plavix in 3 days             lovenox in interim        Endocrine            diabetes                         HgbA1c subotimal-  Poor control  Continue with current meds and adjust accordingly                                         follow up with PCP               6. DVT/PE prophylaxis:             Lovenox             Will restart Plavix in 3 more days   7. ID:               Perioperative antibiotics  8. Metabolic Bone Disease:             Metabolic bone workup pending             Vitamin D insufficient                           Supplement   Will need to order additional labs as outpt   9. Activity:             up with therapies             NWB R leg             ROM R Knee as tolerated   10. FEN/Foley/Lines:             Carb modified diet               11. Impediments to fracture healing:             Diabetes              12. Dispo:             discharge home   Signed:  Jari Pigg, PA-C [facility] [phone] (P) 12/20/2014, 10:31 AM

## 2014-12-20 NOTE — Care Management Note (Signed)
Case Management Note  Patient Details  Name: Jadier Chausse MRN: [id number] Date of Birth: 1955/09/02  Subjective/Objective:             S/p ORIF right tibial plateau       Action/Plan: Spoke with patient about home health, he selected [facility]. Contacted [facility], they will not be able to work with the patient. Patient chose [facility]. Contacted [facility] at [facility] and set up HHPT. Patient has crutches, no other DME needs identified. Patient will have family available to assist after discharge.   Expected Discharge Date:                  Expected Discharge Plan:  Home w Home Health Services  In-House Referral:  NA  Discharge planning Services  CM Consult  Post Acute Care Choice:  Home Health Choice offered to:  Patient  DME Arranged:    DME Agency:     HH Arranged:  PT HH Agency:  [facility]  Status of Service:  Completed, signed off  Medicare Important Message Given:    Date Medicare IM Given:    Medicare IM give by:    Date Additional Medicare IM Given:    Additional Medicare Important Message give by:     If discussed at Long Length of Stay Meetings, dates discussed:    Additional Comments:  Monica Becton, RN 12/20/2014, 1:41 PM

## 2014-12-20 NOTE — Evaluation (Signed)
Physical Therapy Evaluation Patient Details Name: Mitchell Hammond MRN: [id number] DOB: 01-Feb-1956 Today's Date: 12/20/2014   History of Present Illness  Right bicondylar tibial plateau fracture s/p ORIF with partial medial menisectomy   Clinical Impression  Patient is making good progress with PT.  From a mobility standpoint anticipate patient will be ready for DC home following afternoon session to continue with gait training and stairs.        Follow Up Recommendations Home health PT    Equipment Recommendations  None recommended by PT (has own crutches)    Recommendations for Other Services       Precautions / Restrictions Precautions Required Braces or Orthoses: Other Brace/Splint (knee brace, unlocked full range) Restrictions Weight Bearing Restrictions: Yes RLE Weight Bearing: Non weight bearing      Mobility  Bed Mobility Overal bed mobility: Needs Assistance Bed Mobility: Supine to Sit;Sit to Supine     Supine to sit: Supervision Sit to supine: Supervision      Transfers Overall transfer level: Needs assistance Equipment used: Crutches Transfers: Sit to/from Stand Sit to Stand: Min guard            Ambulation/Gait Ambulation/Gait assistance: Min guard Ambulation Distance (Feet): 100 Feet Assistive device: Crutches Gait Pattern/deviations:  (swing through pattern, consistent with NWB)        Stairs            Wheelchair Mobility    Modified Rankin (Stroke Patients Only)       Balance Overall balance assessment: Needs assistance Sitting-balance support: No upper extremity supported Sitting balance-Leahy Scale: Good     Standing balance support: Bilateral upper extremity supported Standing balance-Leahy Scale: Poor                               Pertinent Vitals/Pain Pain Assessment: 0-10 Pain Score: 7  Pain Location: Rt leg Pain Descriptors / Indicators: Aching Pain Intervention(s): Monitored during  session;Repositioned    Home Living Family/patient expects to be discharged to:: Private residence Living Arrangements: Spouse/significant other;Children Available Help at Discharge: Family Type of Home: House Home Access: Stairs to enter Entrance Stairs-Rails: Right Entrance Stairs-Number of Steps: 2 Home Layout: One level Home Equipment: Crutches      Prior Function Level of Independence: Independent with assistive device(s)               Hand Dominance        Extremity/Trunk Assessment               Lower Extremity Assessment: RLE deficits/detail RLE Deficits / Details: limited strength due to pain       Communication   Communication: No difficulties  Cognition Arousal/Alertness: Awake/alert Behavior During Therapy: WFL for tasks assessed/performed Overall Cognitive Status: Within Functional Limits for tasks assessed                      General Comments      Exercises        Assessment/Plan    PT Assessment Patient needs continued PT services  PT Diagnosis Difficulty walking   PT Problem List Decreased strength;Decreased range of motion;Decreased activity tolerance;Decreased balance;Decreased mobility;Decreased knowledge of use of DME  PT Treatment Interventions DME instruction;Gait training;Stair training;Functional mobility training;Therapeutic activities;Therapeutic exercise;Balance training;Patient/family education   PT Goals (Current goals can be found in the Care Plan section) Acute Rehab PT Goals Patient Stated Goal: go home today PT Goal Formulation: With  patient Time For Goal Achievement: 01/03/15 Potential to Achieve Goals: Good    Frequency Min 4X/week   Barriers to discharge        Co-evaluation               End of Session Equipment Utilized During Treatment: Gait belt (hinged knee brace not available) Activity Tolerance: Patient tolerated treatment well Patient left: in bed;with call bell/phone within  reach;with SCD's reapplied Nurse Communication: Mobility status         Time: 3893-7342 PT Time Calculation (min) (ACUTE ONLY): 29 min   Charges:   PT Evaluation $Initial PT Evaluation Tier I: 1 Procedure PT Treatments $Gait Training: 8-22 mins   PT G Codes:        Christiane Ha, PT, CSCS Pager [phone] Office [phone]  12/20/2014, 1:06 PM

## 2014-12-20 NOTE — Progress Notes (Signed)
Utilization review completed.  

## 2014-12-20 NOTE — Progress Notes (Addendum)
Inpatient Diabetes Program Recommendations  AACE/ADA: New Consensus Statement on Inpatient Glycemic Control (2013)  Target Ranges:  Prepandial:   less than 140 mg/dL      Peak postprandial:   less than 180 mg/dL (1-2 hours)      Critically ill patients:  140 - 180 mg/dL   Reason for Visit: Referral received.   Results for Mitchell Hammond, Mitchell Hammond (MRN [id number]) as of 12/20/2014 16:39  Ref. Range 12/18/2014 11:46  Hemoglobin A1C Latest Ref Range: 4.8-5.6 % 7.6 (H)   Diabetes history: Type 2 diabetes-See's Dr. Nadyne Coombes in [place] for diabetes management Outpatient Diabetes medications: Novolin 70/30 25 units bid Current orders for Inpatient glycemic control:  NPH 30 units bid, Novolog moderate tid with meals and HS scale Metformin 1000 mg bid  Note that patient is on NPH in the hospital instead of 70/30.  Consider changing NPH to Novolog 70/30 30 units bid while in the hospital.    A1C slightly greater than goal.  Discussed results with patient.  Patient complaining of pain and states that his nurse says he can get more medicine at 4 pm.  Briefly discussed A1C goals with patient however he was distracted.  Will need F/U with PCP.  Thanks, Beryl Meager, RN, BC-ADM Inpatient Diabetes Coordinator Pager [phone] (8a-5p)

## 2014-12-21 LAB — VITAMIN D 1,25 DIHYDROXY
VITAMIN D 1, 25 (OH) TOTAL: 37 pg/mL
VITAMIN D3 1, 25 (OH): 36 pg/mL

## 2015-01-30 ENCOUNTER — Emergency Department (HOSPITAL_COMMUNITY): Payer: BLUE CROSS/BLUE SHIELD

## 2015-01-30 ENCOUNTER — Encounter (HOSPITAL_COMMUNITY): Payer: Self-pay | Admitting: Emergency Medicine

## 2015-01-30 ENCOUNTER — Emergency Department (HOSPITAL_COMMUNITY)
Admission: EM | Admit: 2015-01-30 | Discharge: 2015-01-30 | Disposition: A | Discharge status: Home / Self Care | Payer: BLUE CROSS/BLUE SHIELD | Attending: Emergency Medicine | Admitting: Emergency Medicine

## 2015-01-30 DIAGNOSIS — Z7982 Long term (current) use of aspirin: Secondary | ICD-10-CM | POA: Insufficient documentation

## 2015-01-30 DIAGNOSIS — Z9889 Other specified postprocedural states: Secondary | ICD-10-CM | POA: Diagnosis not present

## 2015-01-30 DIAGNOSIS — Z8679 Personal history of other diseases of the circulatory system: Secondary | ICD-10-CM

## 2015-01-30 DIAGNOSIS — R42 Dizziness and giddiness: Secondary | ICD-10-CM | POA: Insufficient documentation

## 2015-01-30 DIAGNOSIS — R091 Pleurisy: Secondary | ICD-10-CM

## 2015-01-30 DIAGNOSIS — G8929 Other chronic pain: Secondary | ICD-10-CM | POA: Insufficient documentation

## 2015-01-30 DIAGNOSIS — E559 Vitamin D deficiency, unspecified: Secondary | ICD-10-CM | POA: Insufficient documentation

## 2015-01-30 DIAGNOSIS — Z794 Long term (current) use of insulin: Secondary | ICD-10-CM | POA: Insufficient documentation

## 2015-01-30 DIAGNOSIS — R110 Nausea: Secondary | ICD-10-CM | POA: Diagnosis present

## 2015-01-30 DIAGNOSIS — Z87891 Personal history of nicotine dependence: Secondary | ICD-10-CM | POA: Insufficient documentation

## 2015-01-30 DIAGNOSIS — R079 Chest pain, unspecified: Secondary | ICD-10-CM | POA: Diagnosis not present

## 2015-01-30 DIAGNOSIS — Z79899 Other long term (current) drug therapy: Secondary | ICD-10-CM | POA: Insufficient documentation

## 2015-01-30 DIAGNOSIS — R2240 Localized swelling, mass and lump, unspecified lower limb: Secondary | ICD-10-CM | POA: Diagnosis not present

## 2015-01-30 DIAGNOSIS — H538 Other visual disturbances: Secondary | ICD-10-CM | POA: Insufficient documentation

## 2015-01-30 DIAGNOSIS — E119 Type 2 diabetes mellitus without complications: Secondary | ICD-10-CM | POA: Diagnosis not present

## 2015-01-30 DIAGNOSIS — M79602 Pain in left arm: Secondary | ICD-10-CM | POA: Insufficient documentation

## 2015-01-30 DIAGNOSIS — I252 Old myocardial infarction: Secondary | ICD-10-CM | POA: Insufficient documentation

## 2015-01-30 DIAGNOSIS — R0789 Other chest pain: Secondary | ICD-10-CM

## 2015-01-30 DIAGNOSIS — I2510 Atherosclerotic heart disease of native coronary artery without angina pectoris: Secondary | ICD-10-CM | POA: Insufficient documentation

## 2015-01-30 LAB — BASIC METABOLIC PANEL
Anion gap: 10 (ref 5–15)
BUN: 10 mg/dL (ref 6–20)
CALCIUM: 9.4 mg/dL (ref 8.9–10.3)
CO2: 25 mmol/L (ref 22–32)
CREATININE: 0.86 mg/dL (ref 0.61–1.24)
Chloride: 102 mmol/L (ref 101–111)
GFR calc Af Amer: >60 mL/min (ref >60)
GLUCOSE: 170 mg/dL — AB (ref 65–99)
Potassium: 4.1 mmol/L (ref 3.5–5.1)
Sodium: 137 mmol/L (ref 135–145)

## 2015-01-30 LAB — I-STAT TROPONIN, ED
TROPONIN I, POC: 0.01 ng/mL (ref 0.00–0.08)
Troponin i, poc: 0 ng/mL (ref 0.00–0.08)

## 2015-01-30 LAB — CBC WITH DIFFERENTIAL/PLATELET
BASOS ABS: 0 10*3/uL (ref 0.0–0.1)
BASOS PCT: 0 % (ref 0–1)
EOS PCT: 1 % (ref 0–5)
Eosinophils Absolute: 0.1 10*3/uL (ref 0.0–0.7)
HCT: 42.6 % (ref 39.0–52.0)
Hemoglobin: 14.1 g/dL (ref 13.0–17.0)
LYMPHS PCT: 16 % (ref 12–46)
Lymphs Abs: 1.1 10*3/uL (ref 0.7–4.0)
MCH: 27.3 pg (ref 26.0–34.0)
MCHC: 33.1 g/dL (ref 30.0–36.0)
MCV: 82.4 fL (ref 78.0–100.0)
MONO ABS: 0.3 10*3/uL (ref 0.1–1.0)
Monocytes Relative: 5 % (ref 3–12)
Neutro Abs: 5.3 10*3/uL (ref 1.7–7.7)
Neutrophils Relative %: 78 % — ABNORMAL HIGH (ref 43–77)
PLATELETS: 201 10*3/uL (ref 150–400)
RBC: 5.17 MIL/uL (ref 4.22–5.81)
RDW: 13.7 % (ref 11.5–15.5)
WBC: 6.9 10*3/uL (ref 4.0–10.5)

## 2015-01-30 MED ORDER — IOHEXOL 350 MG/ML SOLN
100.0000 mL | Freq: Once | INTRAVENOUS | Status: AC | PRN
  Administered 2015-01-30: 100 mL via INTRAVENOUS

## 2015-01-30 MED ORDER — PANTOPRAZOLE SODIUM 20 MG PO TBEC: 20.0000 mg | DELAYED_RELEASE_TABLET | Freq: Every day | ORAL | Status: DC

## 2015-01-30 MED ORDER — PANTOPRAZOLE SODIUM 40 MG IV SOLR
40.0000 mg | Freq: Once | INTRAVENOUS | Status: AC
  Administered 2015-01-30: 40 mg via INTRAVENOUS
  Filled 2015-01-30: qty 40

## 2015-01-30 MED ORDER — CLOPIDOGREL BISULFATE 75 MG PO TABS
75.0000 mg | ORAL_TABLET | Freq: Once | ORAL | Status: AC
  Administered 2015-01-30: 75 mg via ORAL
  Filled 2015-01-30: qty 1

## 2015-01-30 NOTE — ED Provider Notes (Signed)
CSN: [id number]     Arrival date & time 01/30/15  1410 History   First MD Initiated Contact with Patient 01/30/15 1656     Chief Complaint  Patient presents with  . Blurred Vision  . Numbness  . Nausea     (Consider location/radiation/quality/duration/timing/severity/associated sxs/prior Treatment) HPI The patient was admitted driving into town when he quite suddenly felt lightheaded and got a very sharp pain into his left arm. In his left arm. He reports for a brief period time it was very difficult for him to take a deep breath because it hurts so much to do so. At that same time he felt like he developed a headache in his vision became slightly blurred. All the symptoms occurred within several minutes of each other. Once he was able to calm his breathing and take deep breaths more comfortably, the patient proceeded to the emergency department. At this time he states he still has some discomfort in his armpit if he takes a very deep breath. He also had a feeling of nausea. The patient is wearing a knee brace for a right knee surgery that he had had in the beginning of August. He does state over the past several days he noted some increased swelling of the foot. The patient ran out of his Plavix 2 days ago. He is taking his daily aspirin and has already taken today. He otherwise has felt well recently without fever, cough or GI illness. Past Medical History  Diagnosis Date  . IDDM (insulin dependent diabetes mellitus)   . CAD (coronary artery disease)   . MI (myocardial infarction)     x 2, Multi-link Vision 3.5 x 15 mm BMS in 2009 (mid LAD), balloon 2015  . Chronic back pain   . Discitis   . Vitamin D insufficiency    Past Surgical History  Procedure Laterality Date  . Cardiac catheterization      x 2, 2009 & 2015  . Hand surgery Left   . Back surgery      2014  . Orif tibia plateau Right 12/19/2014    Procedure: OPEN REDUCTION INTERNAL FIXATION (ORIF) RIGHT TIBIAL PLATEAU;  Surgeon:  Myrene Galas, MD;  Location: [facility] OR;  Service: Orthopedics;  Laterality: Right;  . Fasciotomy Right 12/19/2014    Procedure: ANTERIOR COMPARTMENTAL FASCIOTOMY;  Surgeon: Myrene Galas, MD;  Location: [facility] OR;  Service: Orthopedics;  Laterality: Right;   Family History  Problem Relation Age of Onset  . Diabetes Mother   . CAD Mother    Social History  Substance Use Topics  . Smoking status: Former Smoker -- 1.00 packs/day    Types: Cigarettes    Quit date: 02/20/2007  . Smokeless tobacco: Never Used  . Alcohol Use: No     Comment: None in years    Review of Systems 10 Systems reviewed and are negative for acute change except as noted in the HPI.   Allergies  Review of patient's allergies indicates no known allergies.  Home Medications   Prior to Admission medications   Medication Sig Start Date End Date Taking? Authorizing Provider  aspirin EC 81 MG tablet Take 81 mg by mouth daily.   Yes Historical Provider, MD  atorvastatin (LIPITOR) 40 MG tablet Take 40 mg by mouth daily at 6 PM.    Yes Historical Provider, MD  buPROPion (WELLBUTRIN XL) 300 MG 24 hr tablet Take 300 mg by mouth at bedtime. 10/20/14  Yes Historical Provider, MD  clopidogrel (PLAVIX) 75 MG  tablet Take 75 mg by mouth daily. 10/20/14  Yes Historical Provider, MD  docusate sodium (COLACE) 100 MG capsule Take 100 mg by mouth daily.   Yes Historical Provider, MD  gabapentin (NEURONTIN) 300 MG capsule Take 300 mg by mouth 3 (three) times daily. 10/09/14  Yes Historical Provider, MD  gemfibrozil (LOPID) 600 MG tablet Take 600 mg by mouth 2 (two) times daily before a meal.   Yes Historical Provider, MD  insulin NPH-regular Human (NOVOLIN 70/30) (70-30) 100 UNIT/ML injection Inject 25 Units into the skin 2 (two) times daily with a meal.   Yes Historical Provider, MD  losartan (COZAAR) 50 MG tablet Take 50 mg by mouth daily. 10/09/14  Yes Historical Provider, MD  metFORMIN (GLUCOPHAGE) 1000 MG tablet Take 1,000 mg by mouth 2 (two)  times daily with a meal.   Yes Historical Provider, MD  methocarbamol (ROBAXIN) 500 MG tablet Take 1-2 tablets (500-1,000 mg total) by mouth every 6 (six) hours as needed for muscle spasms. 12/20/14  Yes Montez Morita, PA-C  metoprolol succinate (TOPROL-XL) 25 MG 24 hr tablet Take 25 mg by mouth daily.   Yes Historical Provider, MD  nitroGLYCERIN (NITROSTAT) 0.4 MG SL tablet Place 0.4 mg under the tongue every 5 (five) minutes as needed for chest pain.   Yes Historical Provider, MD  ondansetron (ZOFRAN) 4 MG tablet Take 4-8 mg by mouth every 6 (six) hours as needed for nausea or vomiting.  01/01/15  Yes Historical Provider, MD  oxyCODONE-acetaminophen (PERCOCET/ROXICET) 5-325 MG per tablet Take 1-2 tablets by mouth every 6 (six) hours as needed for moderate pain or severe pain. 12/20/14  Yes Montez Morita, PA-C  vitamin C (VITAMIN C) 500 MG tablet Take 1 tablet (500 mg total) by mouth 2 (two) times daily. 12/20/14  Yes Montez Morita, PA-C  Vitamin D, Ergocalciferol, (DRISDOL) 50000 UNITS CAPS capsule Take 1 capsule (50,000 Units total) by mouth every 7 (seven) days. 12/20/14  Yes Montez Morita, PA-C  pantoprazole (PROTONIX) 20 MG tablet Take 1 tablet (20 mg total) by mouth daily. 01/30/15   Arby Barrette, MD   BP 131/66 mmHg  Pulse 90  Temp(Src) 98.6 F (37 C) (Oral)  Resp 23  Ht 5\' 5"  (1.651 m)  Wt 180 lb (81.647 kg)  BMI 29.95 kg/m2  SpO2 98% Physical Exam  Constitutional: He is oriented to person, place, and time. He appears well-developed and well-nourished.  HENT:  Head: Normocephalic and atraumatic.  Eyes: EOM are normal. Pupils are equal, round, and reactive to light.  Neck: Neck supple.  Cardiovascular: Normal rate, regular rhythm, normal heart sounds and intact distal pulses.   Pulmonary/Chest: Effort normal and breath sounds normal.  Abdominal: Soft. Bowel sounds are normal. He exhibits no distension. There is no tenderness.  Musculoskeletal: Normal range of motion. He exhibits edema.  Patient has  a well-healed scar on the right knee. He does have 1-2+ pitting edema of the right foot. The foot is warm and dry without erythema.  Neurological: He is alert and oriented to person, place, and time. He has normal strength. No cranial nerve deficit. He exhibits normal muscle tone. Coordination normal. GCS eye subscore is 4. GCS verbal subscore is 5. GCS motor subscore is 6.  Skin: Skin is warm, dry and intact.  Psychiatric: He has a normal mood and affect.    ED Course  Procedures (including critical care time) Labs Review Labs Reviewed  BASIC METABOLIC PANEL - Abnormal; Notable for the following:    Glucose,  Bld 170 (*)    All other components within normal limits  CBC WITH DIFFERENTIAL/PLATELET - Abnormal; Notable for the following:    Neutrophils Relative % 78 (*)    All other components within normal limits  I-STAT TROPOININ, ED  Rosezena Sensor, ED    Imaging Review Dg Chest 2 View  01/30/2015   CLINICAL DATA:  Acute left chest pain, nausea and left arm numbness for 1 day.  EXAM: CHEST  2 VIEW  COMPARISON:  12/18/2014 and prior chest radiographs dating back to 06/21/2012  FINDINGS: The cardiomediastinal silhouette is unremarkable.  Minimal basilar scarring noted.  There is no evidence of focal airspace disease, pulmonary edema, suspicious pulmonary nodule/mass, pleural effusion, or pneumothorax. No acute bony abnormalities are identified.  IMPRESSION: No active cardiopulmonary disease.   Electronically Signed   By: Harmon Pier M.D.   On: 01/30/2015 15:11   Ct Angio Chest Pe W/cm &/or Wo Cm  01/30/2015   CLINICAL DATA:  Left-sided chest pain and shortness of breath. Right knee surgery approximately 6 weeks ago. Leg swelling.  EXAM: CT ANGIOGRAPHY CHEST WITH CONTRAST  TECHNIQUE: Multidetector CT imaging of the chest was performed using the standard protocol during bolus administration of intravenous contrast. Multiplanar CT image reconstructions and MIPs were obtained to evaluate the  vascular anatomy.  CONTRAST:  OMNIPAQUE IOHEXOL 350 MG/ML SOLN 100 cc of Omnipaque 350  COMPARISON:  Chest radiograph of 01/30/2015.  No prior chest CT.  FINDINGS: Mediastinum/Nodes: The quality of this exam for evaluation of pulmonary embolism is moderate to good. No evidence of pulmonary embolism. Normal aortic caliber without dissection. Borderline cardiomegaly. LAD stent versus calcified plaque, including on image 59 of series 401. No mediastinal or hilar adenopathy. Fluid level in the thoracic esophagus on image 55 of series 401.  Lungs/Pleura: No pleural fluid. Minimal degradation secondary to motion and patient right arm position, not raised above the head. Volume loss in both lower lobes.  Upper abdomen: Normal imaged portions of the liver, spleen, stomach, pancreas.  Musculoskeletal: No acute osseous abnormality.  Review of the MIP images confirms the above findings.  IMPRESSION: 1. Mild degradation, secondary to motion and patient arm position, not raised above the head. 2. Given this factor, no pulmonary embolism identified. 3. LAD stent versus coronary artery atherosclerosis. 4. Esophageal air fluid level suggests dysmotility or gastroesophageal reflux.   Electronically Signed   By: Jeronimo Greaves M.D.   On: 01/30/2015 18:33   I have personally reviewed and evaluated these images and lab results as part of my medical decision-making.   EKG Interpretation   Date/Time:  Tuesday January 30 2015 14:22:20 EDT Ventricular Rate:  87 PR Interval:  144 QRS Duration: 86 QT Interval:  350 QTC Calculation: 421 R Axis:   110 Text Interpretation:  Normal sinus rhythm Right axis deviation Abnormal  ECG no STEMI no sig interval change Confirmed by Donnald Garre, MD, Lebron Conners  [phone]) on 01/30/2015 7:56:02 PM     Cardiology has been consult and the patient evaluated. At this point in time the plan was for second set of enzymes and if negative patient is safe from a cardiac perspective for discharge. MDM    Final diagnoses:  Other chest pain  History of coronary artery disease  Pleurisy   Patient presented with acute and severe onset of left-sided chest pain. Due to recent surgery and large [place] swelling, PE study was obtained. No PE is present. Patient's symptoms have significantly improved. He still has minor pleuritic  quality to his pain. He has ruled out for acute MI and cardiology consultation does not feel this is likely a cardiac ischemic event. CT also identified reflux material within the esophagus. An episode of esophageal spasm reflexes also consideration. Patient will be started on Protonix with advice to follow up with his family doctor this week for reassessment. Other precautionary return information is provided.    Arby Barrette, MD 01/30/15 2157

## 2015-01-30 NOTE — Discharge Instructions (Signed)
Chest Pain (Nonspecific) °It is often hard to give a specific diagnosis for the cause of chest pain. There is always a chance that your pain could be related to something serious, such as a heart attack or a blood clot in the lungs. You need to follow up with your health care provider for further evaluation. °CAUSES  °· Heartburn. °· Pneumonia or bronchitis. °· Anxiety or stress. °· Inflammation around your heart (pericarditis) or lung (pleuritis or pleurisy). °· A blood clot in the lung. °· A collapsed lung (pneumothorax). It can develop suddenly on its own (spontaneous pneumothorax) or from trauma to the chest. °· Shingles infection (herpes zoster virus). °The chest wall is composed of bones, muscles, and cartilage. Any of these can be the source of the pain. °· The bones can be bruised by injury. °· The muscles or cartilage can be strained by coughing or overwork. °· The cartilage can be affected by inflammation and become sore (costochondritis). °DIAGNOSIS  °Lab tests or other studies may be needed to find the cause of your pain. Your health care provider may have you take a test called an ambulatory electrocardiogram (ECG). An ECG records your heartbeat patterns over a 24-hour period. You may also have other tests, such as: °· Transthoracic echocardiogram (TTE). During echocardiography, sound waves are used to evaluate how blood flows through your heart. °· Transesophageal echocardiogram (TEE). °· Cardiac monitoring. This allows your health care provider to monitor your heart rate and rhythm in real time. °· Holter monitor. This is a portable device that records your heartbeat and can help diagnose heart arrhythmias. It allows your health care provider to track your heart activity for several days, if needed. °· Stress tests by exercise or by giving medicine that makes the heart beat faster. °TREATMENT  °· Treatment depends on what may be causing your chest pain. Treatment may include: °· Acid blockers for  heartburn. °· Anti-inflammatory medicine. °· Pain medicine for inflammatory conditions. °· Antibiotics if an infection is present. °· You may be advised to change lifestyle habits. This includes stopping smoking and avoiding alcohol, caffeine, and chocolate. °· You may be advised to keep your head raised (elevated) when sleeping. This reduces the chance of acid going backward from your stomach into your esophagus. °Most of the time, nonspecific chest pain will improve within 2-3 days with rest and mild pain medicine.  °HOME CARE INSTRUCTIONS  °· If antibiotics were prescribed, take them as directed. Finish them even if you start to feel better. °· For the next few days, avoid physical activities that bring on chest pain. Continue physical activities as directed. °· Do not use any tobacco products, including cigarettes, chewing tobacco, or electronic cigarettes. °· Avoid drinking alcohol. °· Only take medicine as directed by your health care provider. °· Follow your health care provider's suggestions for further testing if your chest pain does not go away. °· Keep any follow-up appointments you made. If you do not go to an appointment, you could develop lasting (chronic) problems with pain. If there is any problem keeping an appointment, call to reschedule. °SEEK MEDICAL CARE IF:  °· Your chest pain does not go away, even after treatment. °· You have a rash with blisters on your chest. °· You have a fever. °SEEK IMMEDIATE MEDICAL CARE IF:  °· You have increased chest pain or pain that spreads to your arm, neck, jaw, back, or abdomen. °· You have shortness of breath. °· You have an increasing cough, or you cough   up blood. °· You have severe back or abdominal pain. °· You feel nauseous or vomit. °· You have severe weakness. °· You faint. °· You have chills. °This is an emergency. Do not wait to see if the pain will go away. Get medical help at once. Call your local emergency services (911 in [place] Do not drive  yourself to the hospital. °MAKE SURE YOU:  °· Understand these instructions. °· Will watch your condition. °· Will get help right away if you are not doing well or get worse. °Document Released: 02/12/2005 Document Revised: 05/10/2013 Document Reviewed: 12/09/2007 °[employer]® Patient Information ©2015 [employer]. This information is not intended to replace advice given to you by your health care provider. Make sure you discuss any questions you have with your health care provider. °Pleurisy °Pleurisy is an inflammation and swelling of the lining of the lungs (pleura). Because of this inflammation, it hurts to breathe. It can be aggravated by coughing, laughing, or deep breathing. Pleurisy is often caused by an underlying infection or disease.  °HOME CARE INSTRUCTIONS  °Monitor your pleurisy for any changes. The following actions may help to alleviate any discomfort you are experiencing: °· Medicine may help with pain. Only take over-the-counter or prescription medicines for pain, discomfort, or fever as directed by your health care provider. °· Only take antibiotic medicine as directed. Make sure to finish it even if you start to feel better. °SEEK MEDICAL CARE IF:  °· Your pain is not controlled with medicine or is increasing. °· You have an increase in pus-like (purulent) secretions brought up with coughing. °SEEK IMMEDIATE MEDICAL CARE IF:  °· You have blue or dark lips, fingernails, or toenails. °· You are coughing up blood. °· You have increased difficulty breathing. °· You have continuing pain unrelieved by medicine or pain lasting more than 1 week. °· You have pain that radiates into your neck, arms, or jaw. °· You develop increased shortness of breath or wheezing. °· You develop a fever, rash, vomiting, fainting, or other serious symptoms. °MAKE SURE YOU: °· Understand these instructions.   °· Will watch your condition.   °· Will get help right away if you are not doing well or get worse. °  °Document  Released: 05/05/2005 Document Revised: 01/05/2013 Document Reviewed: 10/17/2012 °[employer]® Patient Information ©2015 [employer]. This information is not intended to replace advice given to you by your health care provider. Make sure you discuss any questions you have with your health care provider. ° °

## 2015-01-30 NOTE — ED Notes (Signed)
Pt sts episode of lightheadedness, blurred vision, left arm numbness and nausea; pt sts hx of MI and some similarities

## 2015-01-30 NOTE — Consult Note (Signed)
Referring Physician: Dr. Donnald Garre Primary Physician: Primary Cardiologist: cardiologist in [place] Reason for Consultation:  Left arm pain   HPI:  59 y/o hispanic male with pmh of htn, hlp, dm,cad s/p bms in 2009 and poba in 2015 of unknown vessel on DAPT and guidline directed medical therapy came to ed with c/o dizziness, blurry vision and left arm pain which was sharp in nature that started suddenly while driving. He felt nauseous and vomited once. On arrival to ed he was pain free. He also had tingling of his finger of left arm. He recently had r knee surgery done and was on crutchs for 4 week. His pain is sharp worse with breathing and lasts a few seconds. Not similaR to his episode with MI CTA negative for p.e. He didn't take his plavix for 2 days due to scripts ran out Non smoker Takes his meds regularly In ed trop x 1 negative     Review of Systems:     Cardiac Review of Systems: {Y] = yes [ ]  = no  Chest Pain [ y   ]  Resting SOB [   ] Exertional SOB  [  ]  Orthopnea [  ]   Pedal Edema [   ]    Palpitations [  ] Syncope  [  ]   Presyncope [   ]  General Review of Systems: [Y] = yes [  ]=no Constitional: recent weight change [  ]; anorexia [  ]; fatigue [  ]; nausea [  ]; night sweats [  ]; fever [  ]; or chills [  ];                                                                     Eyes : blurred vision [  ]; diplopia [   ]; vision changes [  ];  Amaurosis fugax[  ]; Resp: cough [  ];  wheezing[  ];  hemoptysis[  ];  PND [  ];  GI:  gallstones[  ], vomiting[  ];  dysphagia[  ]; melena[  ];  hematochezia [  ]; heartburn[  ];   GU: kidney stones [  ]; hematuria[  ];   dysuria [  ];  nocturia[  ]; incontinence [  ];             Skin: rash, swelling[  ];, hair loss[  ];  peripheral edema[  ];  or itching[  ]; Musculosketetal: myalgias[  ];  joint swelling[  ];  joint erythema[  ];  joint pain[  ];  back pain[  ];  Heme/Lymph: bruising[  ];  bleeding[  ];  anemia[  ];    Neuro: TIA[  ];  headaches[  ];  stroke[  ];  vertigo[  ];  seizures[  ];   paresthesias[  ];  difficulty walking[  ];  Psych:depression[  ]; anxiety[  ];  Endocrine: diabetes[  ];  thyroid dysfunction[  ];  Other:  Past Medical History  Diagnosis Date  . IDDM (insulin dependent diabetes mellitus)   . CAD (coronary artery disease)   . MI (myocardial infarction)     x 2, Multi-link Vision 3.5 x 15 mm BMS in 2009 (mid LAD),  balloon 2015  . Chronic back pain   . Discitis   . Vitamin D insufficiency      (Not in a hospital admission)  No current facility-administered medications on file prior to encounter.   Current Outpatient Prescriptions on File Prior to Encounter  Medication Sig Dispense Refill  . aspirin EC 81 MG tablet Take 81 mg by mouth daily.    Marland Kitchen atorvastatin (LIPITOR) 40 MG tablet Take 40 mg by mouth daily at 6 PM.     . buPROPion (WELLBUTRIN XL) 300 MG 24 hr tablet Take 300 mg by mouth at bedtime.  0  . clopidogrel (PLAVIX) 75 MG tablet Take 75 mg by mouth daily.  0  . gabapentin (NEURONTIN) 300 MG capsule Take 300 mg by mouth 3 (three) times daily.  0  . gemfibrozil (LOPID) 600 MG tablet Take 600 mg by mouth 2 (two) times daily before a meal.    . insulin NPH-regular Human (NOVOLIN 70/30) (70-30) 100 UNIT/ML injection Inject 25 Units into the skin 2 (two) times daily with a meal.    . losartan (COZAAR) 50 MG tablet Take 50 mg by mouth daily.  0  . metFORMIN (GLUCOPHAGE) 1000 MG tablet Take 1,000 mg by mouth 2 (two) times daily with a meal.    . methocarbamol (ROBAXIN) 500 MG tablet Take 1-2 tablets (500-1,000 mg total) by mouth every 6 (six) hours as needed for muscle spasms. 90 tablet 1  . metoprolol succinate (TOPROL-XL) 25 MG 24 hr tablet Take 25 mg by mouth daily.    . nitroGLYCERIN (NITROSTAT) 0.4 MG SL tablet Place 0.4 mg under the tongue every 5 (five) minutes as needed for chest pain.    Marland Kitchen oxyCODONE-acetaminophen (PERCOCET/ROXICET) 5-325 MG per tablet Take 1-2  tablets by mouth every 6 (six) hours as needed for moderate pain or severe pain. 90 tablet 0  . vitamin C (VITAMIN C) 500 MG tablet Take 1 tablet (500 mg total) by mouth 2 (two) times daily. 60 tablet 2  . Vitamin D, Ergocalciferol, (DRISDOL) 50000 UNITS CAPS capsule Take 1 capsule (50,000 Units total) by mouth every 7 (seven) days. 12 capsule 0      Infusions:   No Known Allergies  Social History   Social History  . Marital Status: Married    Spouse Name: N/A  . Number of Children: N/A  . Years of Education: N/A   Occupational History  . maintenance manager    Social History Main Topics  . Smoking status: Former Smoker -- 1.00 packs/day    Types: Cigarettes    Quit date: 02/20/2007  . Smokeless tobacco: Never Used  . Alcohol Use: No     Comment: None in years  . Drug Use: No  . Sexual Activity: Not on file   Other Topics Concern  . Not on file   Social History Narrative   Pt lives with wife and daughter in [place].     Family History  Problem Relation Age of Onset  . Diabetes Mother   . CAD Mother     PHYSICAL EXAM: Filed Vitals:   01/30/15 2141  BP: 131/66  Pulse: 90  Temp:   Resp: 23    No intake or output data in the 24 hours ending 01/30/15 2201  General:  Well appearing. No respiratory difficulty HEENT: normal Neck: supple. no JVD. Carotids 2+ bilat; no bruits. No lymphadenopathy or thryomegaly appreciated. Cor: PMI nondisplaced. Regular rate & rhythm. No rubs, gallops or murmurs. Left axillary tenderness +  mild Lungs: clear Abdomen: soft, nontender, nondistended. No hepatosplenomegaly. No bruits or masses. Good bowel sounds. Extremities: no cyanosis, clubbing, rash, edema Neuro: alert & oriented x 3, cranial nerves grossly intact. moves all 4 extremities w/o difficulty. Affect pleasant.  ECG:  Results for orders placed or performed during the hospital encounter of 01/30/15 (from the past 24 hour(s))  Basic metabolic panel     Status:  Abnormal   Collection Time: 01/30/15  2:40 PM  Result Value Ref Range   Sodium 137 135 - 145 mmol/L   Potassium 4.1 3.5 - 5.1 mmol/L   Chloride 102 101 - 111 mmol/L   CO2 25 22 - 32 mmol/L   Glucose, Bld 170 (H) 65 - 99 mg/dL   BUN 10 6 - 20 mg/dL   Creatinine, Ser 1.61 0.61 - 1.24 mg/dL   Calcium 9.4 8.9 - 09.6 mg/dL   GFR calc non Af Amer >60 >60 mL/min   GFR calc Af Amer >60 >60 mL/min   Anion gap 10 5 - 15  CBC with Differential     Status: Abnormal   Collection Time: 01/30/15  2:40 PM  Result Value Ref Range   WBC 6.9 4.0 - 10.5 K/uL   RBC 5.17 4.22 - 5.81 MIL/uL   Hemoglobin 14.1 13.0 - 17.0 g/dL   HCT 04.5 40.9 - 81.1 %   MCV 82.4 78.0 - 100.0 fL   MCH 27.3 26.0 - 34.0 pg   MCHC 33.1 30.0 - 36.0 g/dL   RDW 91.4 78.2 - 95.6 %   Platelets 201 150 - 400 K/uL   Neutrophils Relative % 78 (H) 43 - 77 %   Neutro Abs 5.3 1.7 - 7.7 K/uL   Lymphocytes Relative 16 12 - 46 %   Lymphs Abs 1.1 0.7 - 4.0 K/uL   Monocytes Relative 5 3 - 12 %   Monocytes Absolute 0.3 0.1 - 1.0 K/uL   Eosinophils Relative 1 0 - 5 %   Eosinophils Absolute 0.1 0.0 - 0.7 K/uL   Basophils Relative 0 0 - 1 %   Basophils Absolute 0.0 0.0 - 0.1 K/uL  I-stat troponin, ED     Status: None   Collection Time: 01/30/15  2:48 PM  Result Value Ref Range   Troponin i, poc 0.00 0.00 - 0.08 ng/mL   Comment 3          I-stat troponin, ED     Status: None   Collection Time: 01/30/15  9:22 PM  Result Value Ref Range   Troponin i, poc 0.01 0.00 - 0.08 ng/mL   Comment 3           Dg Chest 2 View  01/30/2015   CLINICAL DATA:  Acute left chest pain, nausea and left arm numbness for 1 day.  EXAM: CHEST  2 VIEW  COMPARISON:  12/18/2014 and prior chest radiographs dating back to 06/21/2012  FINDINGS: The cardiomediastinal silhouette is unremarkable.  Minimal basilar scarring noted.  There is no evidence of focal airspace disease, pulmonary edema, suspicious pulmonary nodule/mass, pleural effusion, or pneumothorax. No acute  bony abnormalities are identified.  IMPRESSION: No active cardiopulmonary disease.   Electronically Signed   By: Harmon Pier M.D.   On: 01/30/2015 15:11   Ct Angio Chest Pe W/cm &/or Wo Cm  01/30/2015   CLINICAL DATA:  Left-sided chest pain and shortness of breath. Right knee surgery approximately 6 weeks ago. Leg swelling.  EXAM: CT ANGIOGRAPHY CHEST WITH CONTRAST  TECHNIQUE: Multidetector  CT imaging of the chest was performed using the standard protocol during bolus administration of intravenous contrast. Multiplanar CT image reconstructions and MIPs were obtained to evaluate the vascular anatomy.  CONTRAST:  OMNIPAQUE IOHEXOL 350 MG/ML SOLN 100 cc of Omnipaque 350  COMPARISON:  Chest radiograph of 01/30/2015.  No prior chest CT.  FINDINGS: Mediastinum/Nodes: The quality of this exam for evaluation of pulmonary embolism is moderate to good. No evidence of pulmonary embolism. Normal aortic caliber without dissection. Borderline cardiomegaly. LAD stent versus calcified plaque, including on image 59 of series 401. No mediastinal or hilar adenopathy. Fluid level in the thoracic esophagus on image 55 of series 401.  Lungs/Pleura: No pleural fluid. Minimal degradation secondary to motion and patient right arm position, not raised above the head. Volume loss in both lower lobes.  Upper abdomen: Normal imaged portions of the liver, spleen, stomach, pancreas.  Musculoskeletal: No acute osseous abnormality.  Review of the MIP images confirms the above findings.  IMPRESSION: 1. Mild degradation, secondary to motion and patient arm position, not raised above the head. 2. Given this factor, no pulmonary embolism identified. 3. LAD stent versus coronary artery atherosclerosis. 4. Esophageal air fluid level suggests dysmotility or gastroesophageal reflux.   Electronically Signed   By: Jeronimo Greaves M.D.   On: 01/30/2015 18:33     EKG  NSR, LPFB  ASSESSMENT:  Chest pain /arm pain non cardiac in  nature   PLAN/DISCUSSION:  1. Repeat trop and if negative he can be discharged from cardiac standpoint with follow up with his cardiologist in 1 weeks 2. I discussed the above plan with him and his daughter at bedside and they agree with plan and will call his cardiologist tomorrow morning for output follow up  Shemekia Patane Alonna Minium

## 2015-07-23 ENCOUNTER — Encounter (HOSPITAL_COMMUNITY): Payer: Self-pay | Admitting: *Deleted

## 2015-07-23 NOTE — Progress Notes (Signed)
Pt denies recent chest pain or sob. Pt is diabetic. Instructed pt to take 17 units (70 %) of Novolin 70/30 this evening and 12 units (50%) Novolin 70/30 in AM. Instructed him not to take Metformin in AM and to check his blood sugar every 2 hours prior to arrival once awake. Instructed pt if blood sugar is 70 or less he needs to treat it with 1/2 cup (4 oz) of clear juice (apple or cranberry) and then to recheck his blood sugar 15 minutes after drinking the juice. If blood sugar is still 70 or less, pt instructed to call Short Stay and speak with a nurse. He was given phone #. These instructions given per Diabetes Medication Adjustment Guidelines Prior to Procedure and Surgery. Pt voiced understanding.

## 2015-07-23 NOTE — Progress Notes (Signed)
Anesthesia Chart Review:  Pt is a 61 year old male scheduled for R tibia hardware removal, R knee arthroscopy on 07/24/2015 with Dr. Carola Frost.   Pt is a same day work up.   PMH includes:  CAD (mid LAD BMS 2009, balloon angioplasty 2015), MI, DM.  Former smoker. BMI 30. S/p ORIF R tibial plateau 12/19/14.   Medications include: ASA, lipitor, plavix, gemfibrozil, novolin, losartan, metformin, metoprolol, protonix  Labs will be obtained DOS.   Chest x-ray 01/30/15 reviewed. No active cardiopulmonary disease.   EKG 01/30/15: NSR. RAD  By report, last cardiac cath was 04/2014 at [facility]. Records have been requested.   Pt was seen by Dr. Tobias Alexander 12/18/14 for pre-op eval prior to ORIF last August. From her note, "He states that his last episode of chest pain was prior to the angioplasty [04/2014] and he has been working full time and active with no limitations with angina, SOB, palpitations, Or syncope. He also denies lower extremity edema, orthopnea, PND". For the ORIF last summer, Dr. Delton See felt "He is considered an intermediate risk for a moderate risk surgery, however there is currently no contraindication for his planned surgery. No further cardiac work up is necessary prior to the surgery."  Pt will need further assessment by his assigned anesthesiologist DOS.   Rica Mast, FNP-BC [facility]/Anesthesiology Phone: [phone] 07/23/2015 4:39 PM

## 2015-07-24 ENCOUNTER — Encounter (HOSPITAL_COMMUNITY): Payer: Self-pay | Admitting: *Deleted

## 2015-07-24 ENCOUNTER — Encounter (HOSPITAL_COMMUNITY)
Admission: RE | Disposition: A | Discharge status: Home / Self Care | Payer: Self-pay | Source: Ambulatory Visit | Attending: Orthopedic Surgery

## 2015-07-24 ENCOUNTER — Ambulatory Visit (HOSPITAL_COMMUNITY): Payer: Medicaid Other | Admitting: Emergency Medicine

## 2015-07-24 ENCOUNTER — Ambulatory Visit (HOSPITAL_COMMUNITY)
Admission: RE | Admit: 2015-07-24 | Discharge: 2015-07-24 | Disposition: A | Discharge status: Home / Self Care | Payer: Medicaid Other | Source: Ambulatory Visit | Attending: Orthopedic Surgery | Admitting: Orthopedic Surgery

## 2015-07-24 DIAGNOSIS — Z7982 Long term (current) use of aspirin: Secondary | ICD-10-CM | POA: Diagnosis not present

## 2015-07-24 DIAGNOSIS — E119 Type 2 diabetes mellitus without complications: Secondary | ICD-10-CM | POA: Diagnosis not present

## 2015-07-24 DIAGNOSIS — I10 Essential (primary) hypertension: Secondary | ICD-10-CM | POA: Insufficient documentation

## 2015-07-24 DIAGNOSIS — Y838 Other surgical procedures as the cause of abnormal reaction of the patient, or of later complication, without mention of misadventure at the time of the procedure: Secondary | ICD-10-CM | POA: Insufficient documentation

## 2015-07-24 DIAGNOSIS — S83241A Other tear of medial meniscus, current injury, right knee, initial encounter: Secondary | ICD-10-CM | POA: Diagnosis not present

## 2015-07-24 DIAGNOSIS — I2510 Atherosclerotic heart disease of native coronary artery without angina pectoris: Secondary | ICD-10-CM | POA: Diagnosis not present

## 2015-07-24 DIAGNOSIS — Z794 Long term (current) use of insulin: Secondary | ICD-10-CM | POA: Insufficient documentation

## 2015-07-24 DIAGNOSIS — Z7902 Long term (current) use of antithrombotics/antiplatelets: Secondary | ICD-10-CM | POA: Diagnosis not present

## 2015-07-24 DIAGNOSIS — T8484XA Pain due to internal orthopedic prosthetic devices, implants and grafts, initial encounter: Secondary | ICD-10-CM | POA: Diagnosis present

## 2015-07-24 DIAGNOSIS — X58XXXA Exposure to other specified factors, initial encounter: Secondary | ICD-10-CM | POA: Insufficient documentation

## 2015-07-24 DIAGNOSIS — IMO0001 Reserved for inherently not codable concepts without codable children: Secondary | ICD-10-CM

## 2015-07-24 DIAGNOSIS — I252 Old myocardial infarction: Secondary | ICD-10-CM | POA: Diagnosis not present

## 2015-07-24 DIAGNOSIS — Z87891 Personal history of nicotine dependence: Secondary | ICD-10-CM | POA: Diagnosis not present

## 2015-07-24 DIAGNOSIS — S82141D Displaced bicondylar fracture of right tibia, subsequent encounter for closed fracture with routine healing: Secondary | ICD-10-CM

## 2015-07-24 DIAGNOSIS — Z79899 Other long term (current) drug therapy: Secondary | ICD-10-CM | POA: Diagnosis not present

## 2015-07-24 HISTORY — PX: KNEE ARTHROSCOPY: SHX127

## 2015-07-24 HISTORY — PX: HARDWARE REMOVAL: SHX979

## 2015-07-24 HISTORY — DX: Essential (primary) hypertension: I10

## 2015-07-24 LAB — BASIC METABOLIC PANEL
ANION GAP: 12 (ref 5–15)
BUN: 9 mg/dL (ref 6–20)
CO2: 25 mmol/L (ref 22–32)
Calcium: 9.3 mg/dL (ref 8.9–10.3)
Chloride: 104 mmol/L (ref 101–111)
Creatinine, Ser: 0.91 mg/dL (ref 0.61–1.24)
Glucose, Bld: 138 mg/dL — ABNORMAL HIGH (ref 65–99)
Potassium: 3.7 mmol/L (ref 3.5–5.1)
Sodium: 141 mmol/L (ref 135–145)

## 2015-07-24 LAB — CBC
HCT: 44.8 % (ref 39.0–52.0)
HEMOGLOBIN: 15.5 g/dL (ref 13.0–17.0)
MCH: 28.8 pg (ref 26.0–34.0)
MCHC: 34.6 g/dL (ref 30.0–36.0)
MCV: 83.3 fL (ref 78.0–100.0)
Platelets: 169 10*3/uL (ref 150–400)
RBC: 5.38 MIL/uL (ref 4.22–5.81)
RDW: 13.8 % (ref 11.5–15.5)
WBC: 7 10*3/uL (ref 4.0–10.5)

## 2015-07-24 LAB — GLUCOSE, CAPILLARY
GLUCOSE-CAPILLARY: 155 mg/dL — AB (ref 65–99)
Glucose-Capillary: 151 mg/dL — ABNORMAL HIGH (ref 65–99)
Glucose-Capillary: 163 mg/dL — ABNORMAL HIGH (ref 65–99)

## 2015-07-24 SURGERY — REMOVAL, HARDWARE
Anesthesia: General | Site: Knee | Laterality: Right

## 2015-07-24 MED ORDER — LACTATED RINGERS IV SOLN
INTRAVENOUS | Status: DC
  Administered 2015-07-24 (×3): via INTRAVENOUS

## 2015-07-24 MED ORDER — DEXAMETHASONE SODIUM PHOSPHATE 4 MG/ML IJ SOLN
INTRAMUSCULAR | Status: AC
  Filled 2015-07-24: qty 2

## 2015-07-24 MED ORDER — PROMETHAZINE HCL 25 MG/ML IJ SOLN: 6.2500 mg | INTRAMUSCULAR | Status: DC | PRN

## 2015-07-24 MED ORDER — LIDOCAINE HCL (CARDIAC) 20 MG/ML IV SOLN
INTRAVENOUS | Status: DC | PRN
  Administered 2015-07-24: 60 mg via INTRAVENOUS

## 2015-07-24 MED ORDER — MIDAZOLAM HCL 5 MG/5ML IJ SOLN
INTRAMUSCULAR | Status: DC | PRN
  Administered 2015-07-24: 2 mg via INTRAVENOUS

## 2015-07-24 MED ORDER — PHENYLEPHRINE 40 MCG/ML (10ML) SYRINGE FOR IV PUSH (FOR BLOOD PRESSURE SUPPORT)
PREFILLED_SYRINGE | INTRAVENOUS | Status: AC
Start: 2015-07-24 — End: 2015-07-24
  Filled 2015-07-24: qty 10

## 2015-07-24 MED ORDER — ONDANSETRON HCL 4 MG/2ML IJ SOLN
INTRAMUSCULAR | Status: DC | PRN
  Administered 2015-07-24: 4 mg via INTRAVENOUS

## 2015-07-24 MED ORDER — PHENYLEPHRINE HCL 10 MG/ML IJ SOLN
INTRAMUSCULAR | Status: DC | PRN
  Administered 2015-07-24 (×5): 80 ug via INTRAVENOUS
  Administered 2015-07-24: 120 ug via INTRAVENOUS

## 2015-07-24 MED ORDER — BUPIVACAINE-EPINEPHRINE (PF) 0.5% -1:200000 IJ SOLN
INTRAMUSCULAR | Status: AC
  Filled 2015-07-24: qty 30

## 2015-07-24 MED ORDER — LACTATED RINGERS IV SOLN: INTRAVENOUS | Status: DC

## 2015-07-24 MED ORDER — LIDOCAINE HCL (CARDIAC) 20 MG/ML IV SOLN
INTRAVENOUS | Status: AC
  Filled 2015-07-24: qty 5

## 2015-07-24 MED ORDER — FENTANYL CITRATE (PF) 100 MCG/2ML IJ SOLN
INTRAMUSCULAR | Status: AC
  Filled 2015-07-24: qty 2

## 2015-07-24 MED ORDER — MIDAZOLAM HCL 2 MG/2ML IJ SOLN
INTRAMUSCULAR | Status: AC
Start: 2015-07-24 — End: 2015-07-24
  Administered 2015-07-24: 2 mg
  Filled 2015-07-24: qty 2

## 2015-07-24 MED ORDER — PROPOFOL 10 MG/ML IV BOLUS
INTRAVENOUS | Status: DC | PRN
  Administered 2015-07-24: 200 mg via INTRAVENOUS

## 2015-07-24 MED ORDER — METOPROLOL TARTRATE 12.5 MG HALF TABLET
25.0000 mg | ORAL_TABLET | Freq: Once | ORAL | Status: AC
  Administered 2015-07-24: 25 mg via ORAL

## 2015-07-24 MED ORDER — BUPIVACAINE-EPINEPHRINE 0.5% -1:200000 IJ SOLN
INTRAMUSCULAR | Status: DC | PRN
  Administered 2015-07-24: 15 mL

## 2015-07-24 MED ORDER — CHLORHEXIDINE GLUCONATE 4 % EX LIQD: 60.0000 mL | Freq: Once | CUTANEOUS | Status: DC

## 2015-07-24 MED ORDER — FENTANYL CITRATE (PF) 100 MCG/2ML IJ SOLN
INTRAMUSCULAR | Status: DC | PRN
  Administered 2015-07-24 (×3): 25 ug via INTRAVENOUS

## 2015-07-24 MED ORDER — MIDAZOLAM HCL 2 MG/2ML IJ SOLN: 2.0000 mg | Freq: Once | INTRAMUSCULAR | Status: DC

## 2015-07-24 MED ORDER — PROPOFOL 10 MG/ML IV BOLUS
INTRAVENOUS | Status: AC
  Filled 2015-07-24: qty 20

## 2015-07-24 MED ORDER — KETOROLAC TROMETHAMINE 10 MG PO TABS: 10.0000 mg | ORAL_TABLET | Freq: Four times a day (QID) | ORAL | Status: DC | PRN

## 2015-07-24 MED ORDER — CEFAZOLIN SODIUM-DEXTROSE 2-3 GM-% IV SOLR
2.0000 g | INTRAVENOUS | Status: AC
  Administered 2015-07-24: 2 g via INTRAVENOUS
  Filled 2015-07-24: qty 50

## 2015-07-24 MED ORDER — GLYCOPYRROLATE 0.2 MG/ML IJ SOLN
INTRAMUSCULAR | Status: DC | PRN
  Administered 2015-07-24: 0.2 mg via INTRAVENOUS

## 2015-07-24 MED ORDER — MEPERIDINE HCL 25 MG/ML IJ SOLN: 6.2500 mg | INTRAMUSCULAR | Status: DC | PRN

## 2015-07-24 MED ORDER — BUPIVACAINE-EPINEPHRINE (PF) 0.5% -1:200000 IJ SOLN
INTRAMUSCULAR | Status: DC | PRN
  Administered 2015-07-24: 30 mL via PERINEURAL

## 2015-07-24 MED ORDER — PROMETHAZINE HCL 12.5 MG PO TABS: 12.5000 mg | ORAL_TABLET | Freq: Four times a day (QID) | ORAL | Status: DC | PRN

## 2015-07-24 MED ORDER — ONDANSETRON HCL 4 MG/2ML IJ SOLN
INTRAMUSCULAR | Status: AC
  Filled 2015-07-24: qty 2

## 2015-07-24 MED ORDER — MIDAZOLAM HCL 2 MG/2ML IJ SOLN
INTRAMUSCULAR | Status: AC
  Filled 2015-07-24: qty 2

## 2015-07-24 MED ORDER — HYDROMORPHONE HCL 1 MG/ML IJ SOLN
INTRAMUSCULAR | Status: AC
  Filled 2015-07-24: qty 1

## 2015-07-24 MED ORDER — PHENYLEPHRINE HCL 10 MG/ML IJ SOLN
10.0000 mg | INTRAVENOUS | Status: DC | PRN
  Administered 2015-07-24: 15 ug/min via INTRAVENOUS

## 2015-07-24 MED ORDER — FENTANYL CITRATE (PF) 100 MCG/2ML IJ SOLN
50.0000 ug | Freq: Once | INTRAMUSCULAR | Status: AC
  Administered 2015-07-24: 50 ug via INTRAVENOUS

## 2015-07-24 MED ORDER — ROCURONIUM BROMIDE 50 MG/5ML IV SOLN
INTRAVENOUS | Status: AC
  Filled 2015-07-24: qty 1

## 2015-07-24 MED ORDER — METOPROLOL TARTRATE 12.5 MG HALF TABLET
ORAL_TABLET | ORAL | Status: AC
  Filled 2015-07-24: qty 2

## 2015-07-24 MED ORDER — FENTANYL CITRATE (PF) 250 MCG/5ML IJ SOLN
INTRAMUSCULAR | Status: AC
  Filled 2015-07-24: qty 5

## 2015-07-24 MED ORDER — KETOROLAC TROMETHAMINE 30 MG/ML IJ SOLN
30.0000 mg | Freq: Once | INTRAMUSCULAR | Status: AC
  Administered 2015-07-24: 30 mg via INTRAVENOUS
  Filled 2015-07-24: qty 1

## 2015-07-24 MED ORDER — PHENYLEPHRINE 40 MCG/ML (10ML) SYRINGE FOR IV PUSH (FOR BLOOD PRESSURE SUPPORT)
PREFILLED_SYRINGE | INTRAVENOUS | Status: AC
  Filled 2015-07-24: qty 10

## 2015-07-24 MED ORDER — HYDROMORPHONE HCL 1 MG/ML IJ SOLN
0.2500 mg | INTRAMUSCULAR | Status: DC | PRN
  Administered 2015-07-24: 0.5 mg via INTRAVENOUS

## 2015-07-24 MED ORDER — SODIUM CHLORIDE 0.9 % IR SOLN
Status: DC | PRN
  Administered 2015-07-24: 1000 mL
  Administered 2015-07-24 (×4): 3000 mL

## 2015-07-24 MED ORDER — KETOROLAC TROMETHAMINE 30 MG/ML IJ SOLN
INTRAMUSCULAR | Status: AC
  Filled 2015-07-24: qty 1

## 2015-07-24 MED ORDER — OXYCODONE-ACETAMINOPHEN 5-325 MG PO TABS: 1.0000 | ORAL_TABLET | Freq: Four times a day (QID) | ORAL | Status: DC | PRN

## 2015-07-24 SURGICAL SUPPLY — 62 items
BANDAGE ELASTIC 4 VELCRO ST LF (GAUZE/BANDAGES/DRESSINGS) ×1 IMPLANT
BANDAGE ELASTIC 6 VELCRO ST LF (GAUZE/BANDAGES/DRESSINGS) ×4 IMPLANT
BANDAGE ESMARK 6X9 LF (GAUZE/BANDAGES/DRESSINGS) ×1 IMPLANT
BLADE GREAT WHITE 4.2 (BLADE) ×2 IMPLANT
BLADE GREAT WHITE 4.2MM (BLADE) ×1
BLADE SURG 11 STRL SS (BLADE) ×3 IMPLANT
BNDG CMPR 9X6 STRL LF SNTH (GAUZE/BANDAGES/DRESSINGS) ×1
BNDG ESMARK 6X9 LF (GAUZE/BANDAGES/DRESSINGS) ×3
BNDG GAUZE ELAST 4 BULKY (GAUZE/BANDAGES/DRESSINGS) ×4 IMPLANT
BRUSH SCRUB DISP (MISCELLANEOUS) ×4 IMPLANT
COVER SURGICAL LIGHT HANDLE (MISCELLANEOUS) ×4 IMPLANT
CUFF TOURNIQUET SINGLE 34IN LL (TOURNIQUET CUFF) ×2 IMPLANT
DRAPE ARTHROSCOPY W/POUCH 114 (DRAPES) ×3 IMPLANT
DRAPE C-ARMOR (DRAPES) ×3 IMPLANT
DRAPE OEC MINIVIEW 54X84 (DRAPES) ×1 IMPLANT
DRAPE PROXIMA HALF (DRAPES) ×2 IMPLANT
DRAPE U-SHAPE 47X51 STRL (DRAPES) ×3 IMPLANT
DRSG ADAPTIC 3X8 NADH LF (GAUZE/BANDAGES/DRESSINGS) ×3 IMPLANT
DRSG EMULSION OIL 3X3 NADH (GAUZE/BANDAGES/DRESSINGS) ×3 IMPLANT
DRSG PAD ABDOMINAL 8X10 ST (GAUZE/BANDAGES/DRESSINGS) ×6 IMPLANT
ELECT REM PT RETURN 9FT ADLT (ELECTROSURGICAL) ×3
ELECTRODE REM PT RTRN 9FT ADLT (ELECTROSURGICAL) ×1 IMPLANT
GAUZE SPONGE 4X4 12PLY STRL (GAUZE/BANDAGES/DRESSINGS) ×3 IMPLANT
GLOVE BIO SURGEON STRL SZ 6.5 (GLOVE) ×2 IMPLANT
GLOVE BIO SURGEON STRL SZ7.5 (GLOVE) ×3 IMPLANT
GLOVE BIO SURGEON STRL SZ8 (GLOVE) ×3 IMPLANT
GLOVE BIO SURGEONS STRL SZ 6.5 (GLOVE) ×2
GLOVE BIOGEL PI IND STRL 7.0 (GLOVE) IMPLANT
GLOVE BIOGEL PI IND STRL 7.5 (GLOVE) ×1 IMPLANT
GLOVE BIOGEL PI IND STRL 8 (GLOVE) ×1 IMPLANT
GLOVE BIOGEL PI INDICATOR 7.0 (GLOVE) ×4
GLOVE BIOGEL PI INDICATOR 7.5 (GLOVE) ×2
GLOVE BIOGEL PI INDICATOR 8 (GLOVE) ×2
GLOVE SURG SS PI 6.5 STRL IVOR (GLOVE) ×2 IMPLANT
GLOVE SURG SS PI 8.0 STRL IVOR (GLOVE) ×4 IMPLANT
GOWN STRL REUS W/ TWL LRG LVL3 (GOWN DISPOSABLE) ×2 IMPLANT
GOWN STRL REUS W/ TWL XL LVL3 (GOWN DISPOSABLE) ×1 IMPLANT
GOWN STRL REUS W/TWL LRG LVL3 (GOWN DISPOSABLE) ×6
GOWN STRL REUS W/TWL XL LVL3 (GOWN DISPOSABLE) ×3
KIT BASIN OR (CUSTOM PROCEDURE TRAY) ×3 IMPLANT
KIT ROOM TURNOVER OR (KITS) ×3 IMPLANT
MANIFOLD NEPTUNE II (INSTRUMENTS) ×3 IMPLANT
NS IRRIG 1000ML POUR BTL (IV SOLUTION) ×3 IMPLANT
PACK ARTHROSCOPY DSU (CUSTOM PROCEDURE TRAY) ×3 IMPLANT
PAD ARMBOARD 7.5X6 YLW CONV (MISCELLANEOUS) ×6 IMPLANT
PADDING CAST COTTON 6X4 STRL (CAST SUPPLIES) ×9 IMPLANT
SET ARTHROSCOPY TUBING (MISCELLANEOUS) ×3
SET ARTHROSCOPY TUBING LN (MISCELLANEOUS) ×1 IMPLANT
SPONGE GAUZE 4X4 12PLY STER LF (GAUZE/BANDAGES/DRESSINGS) ×2 IMPLANT
SPONGE LAP 18X18 X RAY DECT (DISPOSABLE) ×3 IMPLANT
SPONGE SCRUB IODOPHOR (GAUZE/BANDAGES/DRESSINGS) ×3 IMPLANT
SUT ETHILON 3 0 PS 1 (SUTURE) ×4 IMPLANT
SUT ETHILON 4 0 PS 2 18 (SUTURE) ×3 IMPLANT
SUT VIC AB 0 CT1 27 (SUTURE) ×3
SUT VIC AB 0 CT1 27XBRD ANBCTR (SUTURE) IMPLANT
SUT VIC AB 2-0 CT1 27 (SUTURE) ×3
SUT VIC AB 2-0 CT1 TAPERPNT 27 (SUTURE) IMPLANT
TOWEL OR 17X24 6PK STRL BLUE (TOWEL DISPOSABLE) ×6 IMPLANT
TOWEL OR 17X26 10 PK STRL BLUE (TOWEL DISPOSABLE) ×6 IMPLANT
UNDERPAD 30X30 INCONTINENT (UNDERPADS AND DIAPERS) ×5 IMPLANT
WATER STERILE IRR 1000ML POUR (IV SOLUTION) ×4 IMPLANT
YANKAUER SUCT BULB TIP NO VENT (SUCTIONS) ×3 IMPLANT

## 2015-07-24 NOTE — Transfer of Care (Signed)
Immediate Anesthesia Transfer of Care Note  Patient: Mitchell Hammond  Procedure(s) Performed: Procedure(s): HARDWARE REMOVAL RIGHT TIBIA (Right) RIGHT ARTHROSCOPY KNEE WITH PARTIAL SYNOVECTOMY AND MEDIAL MENISCECTOMY (Right)  Patient Location: PACU  Anesthesia Type:General  Level of Consciousness: awake, alert  and oriented  Airway & Oxygen Therapy: Patient Spontanous Breathing and Patient connected to nasal cannula oxygen  Post-op Assessment: Report given to RN, Post -op Vital signs reviewed and stable and Patient moving all extremities X 4  Post vital signs: Reviewed and stable  Last Vitals:  Filed Vitals:   07/24/15 1005 07/24/15 1010  BP: 112/67 110/63  Pulse: 63 64  Temp:    Resp: 15 15    Complications: No apparent anesthesia complications

## 2015-07-24 NOTE — H&P (Signed)
Orthopaedic Trauma Service   Chief Complaint:  Symptomatic hardware R knee, R knee pain  HPI:   60 year old male well-known to the orthopedic trauma service for repair of a right tibial plateau fracture in August 2016. Patient has done fairly well however he had persistent right knee pain. He is unfortunately lost his job as well. Presents today for removal of hardware and diagnostic arthroscopy  Past Medical History  Diagnosis Date  . CAD (coronary artery disease)   . MI (myocardial infarction) (HCC)     x 2, Multi-link Vision 3.5 x 15 mm BMS in 2009 (mid LAD), balloon 2015  . Chronic back pain   . Discitis   . Vitamin D insufficiency   . Hypertension   . 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 Cardiac catheterization      x 2, 2009 & 2015  . Hand surgery Left   . Back surgery      2014  . Orif tibia plateau Right 12/19/2014    Procedure: OPEN REDUCTION INTERNAL FIXATION (ORIF) RIGHT TIBIAL PLATEAU;  Surgeon: Myrene Galas, MD;  Location: [facility] OR;  Service: Orthopedics;  Laterality: Right;  . Fasciotomy Right 12/19/2014    Procedure: ANTERIOR COMPARTMENTAL FASCIOTOMY;  Surgeon: Myrene Galas, MD;  Location[facility] OR;  Service: Orthopedics;  Laterality: Right;  . Coronary angioplasty      stent - 2008, balloon - 2015  . Tonsillectomy    . Vasectomy    . Colonoscopy      Family History  Problem Relation Age of Onset  . Diabetes Mother   . CAD Mother    Social History:  reports that he quit smoking about 8 years ago. His smoking use included Cigarettes. He smoked 1.00 pack per day. He has never used smokeless tobacco. He reports that he does not drink alcohol or use illicit drugs.  Allergies: No Known Allergies  Medications Prior to Admission  Medication Sig Dispense Refill  . aspirin EC 81 MG tablet Take 81 mg by mouth daily.    Marland Kitchen atorvastatin (LIPITOR) 40 MG tablet Take 40 mg by mouth daily at 6 PM.      . buPROPion (WELLBUTRIN XL) 300 MG 24 hr tablet Take 300 mg by mouth at bedtime.  0  . clopidogrel (PLAVIX) 75 MG tablet Take 75 mg by mouth daily.  0  . docusate sodium (COLACE) 100 MG capsule Take 100 mg by mouth daily.    Marland Kitchen gabapentin (NEURONTIN) 300 MG capsule Take 300 mg by mouth 3 (three) times daily.  0  . gemfibrozil (LOPID) 600 MG tablet Take 600 mg by mouth 2 (two) times daily before a meal.    . insulin NPH-regular Human (NOVOLIN 70/30) (70-30) 100 UNIT/ML injection Inject 25 Units into the skin 2 (two) times daily with a meal.    . losartan (COZAAR) 50 MG tablet Take 50 mg by mouth daily.  0  . metFORMIN (GLUCOPHAGE) 1000 MG tablet Take 1,000 mg by mouth 2 (two) times daily with a meal.    . methocarbamol (ROBAXIN) 500 MG tablet Take 1-2 tablets (500-1,000 mg total) by mouth every 6 (six) hours as needed for muscle spasms. 90 tablet 1  . metoprolol succinate (TOPROL-XL) 25 MG 24 hr tablet Take 25 mg by mouth daily.    . nitroGLYCERIN (NITROSTAT) 0.4 MG SL tablet Place 0.4 mg under the tongue every 5 (five) minutes as  needed for chest pain.    Marland Kitchen ondansetron (ZOFRAN) 4 MG tablet Take 4-8 mg by mouth every 6 (six) hours as needed for nausea or vomiting.   0  . oxyCODONE-acetaminophen (PERCOCET/ROXICET) 5-325 MG per tablet Take 1-2 tablets by mouth every 6 (six) hours as needed for moderate pain or severe pain. 90 tablet 0  . pantoprazole (PROTONIX) 20 MG tablet Take 1 tablet (20 mg total) by mouth daily. 30 tablet 0  . vitamin C (VITAMIN C) 500 MG tablet Take 1 tablet (500 mg total) by mouth 2 (two) times daily. 60 tablet 2  . Vitamin D, Ergocalciferol, (DRISDOL) 50000 UNITS CAPS capsule Take 1 capsule (50,000 Units total) by mouth every 7 (seven) days. 12 capsule 0    No results found for this or any previous visit (from the past 48 hour(s)). No results found.  Review of Systems  Constitutional: Negative for fever and chills.  Respiratory: Negative for shortness of breath and  wheezing.   Cardiovascular: Negative for chest pain.  Gastrointestinal: Negative for nausea and vomiting.  Musculoskeletal: Positive for joint pain (R knee pain ).  Neurological: Negative for tingling, sensory change and headaches.    There were no vitals taken for this visit. Physical Exam  Constitutional: He is oriented to person, place, and time. He appears well-developed and well-nourished. No distress.  HENT:  Head: Normocephalic and atraumatic.  Eyes: Pupils are equal, round, and reactive to light.  Neck: Normal range of motion.  Cardiovascular: Normal rate and regular rhythm.   Respiratory: Effort normal and breath sounds normal.  GI: Soft. Bowel sounds are normal.  Musculoskeletal:  R Lower Extremity    Distal motor and sensory functions intact   Surgical wounds well healed   Ext warm    + joint line tenderness    TTP over hardware      Swelling controlled     Hip and ankle unremarkable     DPN, SPN, TN sensation intact    EHL, FHL, AT, PT, peroneals, gastroc motor intact      Neurological: He is alert and oriented to person, place, and time.  Psychiatric: He has a normal mood and affect. His behavior is normal.     Assessment/Plan  60 y/o male with persistent R knee pain   OR ROH R knee and R knee arthroscopy  Outpatient procedure No restrictions post op  Risks and benefits reviewed, pt wishes to proceed   Mearl Latin, PA-C 07/24/2015, 7:46 AM

## 2015-07-24 NOTE — Discharge Instructions (Signed)
[facility] Discharge Instructions   General Discharge Instructions  WEIGHT BEARING STATUS: Weight-bear as tolerated  RANGE OF MOTION/ACTIVITY: Range of motion as tolerated  Wound Care: Daily dressing changes starting on 07/26/2015. See instructions below   Discharge Wound Care Instructions  Do NOT apply any ointments, solutions or lotions to pin sites or surgical wounds.  These prevent needed drainage and even though solutions like hydrogen peroxide kill bacteria, they also damage cells lining the pin sites that help fight infection.  Applying lotions or ointments can keep the wounds moist and can cause them to breakdown and open up as well. This can increase the risk for infection. When in doubt call the office.  Surgical incisions should be dressed daily.  If any drainage is noted, use one layer of adaptic, then gauze, Kerlix, and an ace wrap.  Once the incision is completely dry and without drainage, it may be left open to air out.  Showering may begin 36-48 hours later.  Cleaning gently with soap and water.  Traumatic wounds should be dressed daily as well.    One layer of adaptic, gauze, Kerlix, then ace wrap.  The adaptic can be discontinued once the draining has ceased    If you have a wet to dry dressing: wet the gauze with saline the squeeze as much saline out so the gauze is moist (not soaking wet), place moistened gauze over wound, then place a dry gauze over the moist one, followed by Kerlix wrap, then ace wrap.    PAIN MEDICATION USE AND EXPECTATIONS  You have likely been given narcotic medications to help control your pain.  After a traumatic event that results in an fracture (broken bone) with or without surgery, it is ok to use narcotic pain medications to help control one's pain.  We understand that everyone responds to pain differently and each individual patient will be evaluated on a regular basis for the continued need for narcotic medications.  Ideally, narcotic medication use should last no more than 6-8 weeks (coinciding with fracture healing).   As a patient it is your responsibility as well to monitor narcotic medication use and report the amount and frequency you use these medications when you come to your office visit.   We would also advise that if you are using narcotic medications, you should take a dose prior to therapy to maximize you participation.  IF YOU ARE ON NARCOTIC MEDICATIONS IT IS NOT PERMISSIBLE TO OPERATE A MOTOR VEHICLE (MOTORCYCLE/CAR/TRUCK/MOPED) OR HEAVY MACHINERY DO NOT MIX NARCOTICS WITH OTHER CNS (CENTRAL NERVOUS SYSTEM) DEPRESSANTS SUCH AS ALCOHOL  Diet: as you were eating previously.  Can use over the counter stool softeners and bowel preparations, such as Miralax, to help with bowel movements.  Narcotics can be constipating.  Be sure to drink plenty of fluids    STOP SMOKING OR USING NICOTINE PRODUCTS!!!!  As discussed nicotine severely impairs your body's ability to heal surgical and traumatic wounds but also impairs bone healing.  Wounds and bone heal by forming microscopic blood vessels (angiogenesis) and nicotine is a vasoconstrictor (essentially, shrinks blood vessels).  Therefore, if vasoconstriction occurs to these microscopic blood vessels they essentially disappear and are unable to deliver necessary nutrients to the healing tissue.  This is one modifiable factor that you can do to dramatically increase your chances of healing your injury.    (This means no smoking, no nicotine gum, patches, etc)  DO NOT USE NONSTEROIDAL ANTI-INFLAMMATORY DRUGS (NSAID'S)  Using products such as  Advil (ibuprofen), Aleve (naproxen), Motrin (ibuprofen) for additional pain control during fracture healing can delay and/or prevent the healing response.  If you would like to take over the counter (OTC) medication, Tylenol (acetaminophen) is ok.  However, some narcotic medications that are given for pain control contain  acetaminophen as well. Therefore, you should not exceed more than 4000 mg of tylenol in a day if you do not have liver disease.  Also note that there are may OTC medicines, such as cold medicines and allergy medicines that my contain tylenol as well.  If you have any questions about medications and/or interactions please ask your doctor/PA or your pharmacist.      ICE AND ELEVATE INJURED/OPERATIVE EXTREMITY  Using ice and elevating the injured extremity above your heart can help with swelling and pain control.  Icing in a pulsatile fashion, such as 20 minutes on and 20 minutes off, can be followed.    Do not place ice directly on skin. Make sure there is a barrier between to skin and the ice pack.    Using frozen items such as frozen peas works well as the conform nicely to the are that needs to be iced.  USE AN ACE WRAP OR TED HOSE FOR SWELLING CONTROL  In addition to icing and elevation, Ace wraps or TED hose are used to help limit and resolve swelling.  It is recommended to use Ace wraps or TED hose until you are informed to stop.    When using Ace Wraps start the wrapping distally (farthest away from the body) and wrap proximally (closer to the body)   Example: If you had surgery on your leg or thing and you do not have a splint on, start the ace wrap at the toes and work your way up to the thigh        If you had surgery on your upper extremity and do not have a splint on, start the ace wrap at your fingers and work your way up to the upper arm  IF YOU ARE IN A SPLINT OR CAST DO NOT REMOVE IT FOR ANY REASON   If your splint gets wet for any reason please contact the office immediately. You may shower in your splint or cast as long as you keep it dry.  This can be done by wrapping in a cast cover or garbage back (or similar)  Do Not stick any thing down your splint or cast such as pencils, money, or hangers to try and scratch yourself with.  If you feel itchy take benadryl as prescribed on the  bottle for itching  IF YOU ARE IN A CAM BOOT (BLACK BOOT)  You may remove boot periodically. Perform daily dressing changes as noted below.  Wash the liner of the boot regularly and wear a sock when wearing the boot. It is recommended that you sleep in the boot until told otherwise  CALL THE OFFICE WITH ANY QUESTIONS OR CONCERTS: [phone]

## 2015-07-24 NOTE — Anesthesia Procedure Notes (Signed)
Anesthesia Regional Block:  Popliteal block  Pre-Anesthetic Checklist: ,, timeout performed, Correct Patient, Correct Site, Correct Laterality, Correct Procedure, Correct Position, site marked, Risks and benefits discussed,  Surgical consent,  Pre-op evaluation,  At surgeon's request and post-op pain management  Laterality: Right  Prep: chloraprep       Needles:  Injection technique: Single-shot  Needle Type: Echogenic Needle     Needle Length: 9cm 9 cm Needle Gauge: 21 and 21 G    Additional Needles:  Procedures: ultrasound guided (picture in chart) Popliteal block Narrative:  Start time: 07/24/2015 9:09 AM End time: 07/24/2015 9:12 AM Injection made incrementally with aspirations every 5 mL.  Performed by: Personally  Anesthesiologist: Shona Simpson D  Additional Notes: No immediate complications noted.

## 2015-07-24 NOTE — Progress Notes (Signed)
reportm given to lauren reynolds rn as caregiver

## 2015-07-24 NOTE — Anesthesia Postprocedure Evaluation (Signed)
Anesthesia Post Note  Patient: Jayvin Butters  Procedure(s) Performed: Procedure(s) (LRB): HARDWARE REMOVAL RIGHT TIBIA (Right) RIGHT ARTHROSCOPY KNEE WITH PARTIAL SYNOVECTOMY AND MEDIAL MENISCECTOMY (Right)  Patient location during evaluation: PACU Anesthesia Type: General Level of consciousness: awake and alert Pain management: pain level controlled Vital Signs Assessment: post-procedure vital signs reviewed and stable Respiratory status: spontaneous breathing, nonlabored ventilation, respiratory function stable and patient connected to nasal cannula oxygen Cardiovascular status: blood pressure returned to baseline and stable Postop Assessment: no signs of nausea or vomiting Anesthetic complications: no    Last Vitals:  Filed Vitals:   07/24/15 1522 07/24/15 1530  BP: 111/73   Pulse: 70 72  Temp:    Resp:      Last Pain:  Filed Vitals:   07/24/15 1547  PainSc: 6                  Kennieth Rad

## 2015-07-24 NOTE — Anesthesia Preprocedure Evaluation (Addendum)
Anesthesia Evaluation  Patient identified by MRN, date of birth, ID band Patient awake    Reviewed: Allergy & Precautions, NPO status , Patient's Chart, lab work & pertinent test results  Airway Mallampati: II  TM Distance: >3 FB Neck ROM: Full    Dental  (+) Teeth Intact   Pulmonary former smoker,    breath sounds clear to auscultation       Cardiovascular hypertension, Pt. on medications + CAD and + Past MI   Rhythm:Regular Rate:Normal     Neuro/Psych negative neurological ROS  negative psych ROS   GI/Hepatic Neg liver ROS, GERD  Medicated,  Endo/Other  diabetes, Insulin Dependent, Oral Hypoglycemic Agents  Renal/GU negative Renal ROS  negative genitourinary   Musculoskeletal  (+) Arthritis ,   Abdominal   Peds negative pediatric ROS (+)  Hematology negative hematology ROS (+)   Anesthesia Other Findings   Reproductive/Obstetrics negative OB ROS                            Lab Results  Component Value Date   WBC 7.0 07/24/2015   HGB 15.5 07/24/2015   HCT 44.8 07/24/2015   MCV 83.3 07/24/2015   PLT 169 07/24/2015   Lab Results  Component Value Date   CREATININE 0.86 01/30/2015   BUN 10 01/30/2015   NA 137 01/30/2015   K 4.1 01/30/2015   CL 102 01/30/2015   CO2 25 01/30/2015   Lab Results  Component Value Date   INR 1.20 12/18/2014   01/2015 EKG: normal sinus rhythm.   Anesthesia Physical Anesthesia Plan  ASA: III  Anesthesia Plan: General   Post-op Pain Management:    Induction: Intravenous  Airway Management Planned: LMA  Additional Equipment:   Intra-op Plan:   Post-operative Plan: Extubation in OR  Informed Consent: I have reviewed the patients History and Physical, chart, labs and discussed the procedure including the risks, benefits and alternatives for the proposed anesthesia with the patient or authorized representative who has indicated his/her  understanding and acceptance.   Dental advisory given  Plan Discussed with: CRNA  Anesthesia Plan Comments:         Anesthesia Quick Evaluation

## 2015-07-24 NOTE — Brief Op Note (Addendum)
07/24/2015  2:11 PM  PATIENT:  Mitchell Hammond  60 y.o. male  PRE-OPERATIVE DIAGNOSIS:  MEDIAL MENISCUS TEAR ON RIGHT, SYMPTOMATIC HARDWARE RIGHT TIBIAL PLATEAU  POST-OPERATIVE DIAGNOSIS:  MEDIAL MENISCUS TEAR ON RIGHT,  SYMPTOMATIC HARDWARE RIGHT TIBIAL PLATEAU  PROCEDURE:  Procedure(s): 1. HARDWARE REMOVAL RIGHT TIBIA (Right) 2. RIGHT ARTHROSCOPY KNEE WITH PARTIAL SYNOVECTOMY 3. PARTIAL MEDIAL MENISCECTOMY (Right)  SURGEON:  Surgeon(s) and Role:    * Myrene Galas, MD - Primary  PHYSICIAN ASSISTANT: Montez Morita, PA-C  ANESTHESIA:   general  I/O:  Total I/O In: 1000 [I.V.:1000] Out: 25 [Blood:25]  SPECIMEN:  No Specimen  TOURNIQUET:    DICTATION: .[id number]

## 2015-07-24 NOTE — Progress Notes (Signed)
Orthopedic Tech Progress Note Patient Details:  Mitchell Hammond 11-Nov-1955 [id number]  Ortho Devices Type of Ortho Device: Crutches Ortho Device/Splint Interventions: Application   Saul Fordyce 07/24/2015, 3:26 PM

## 2015-07-25 ENCOUNTER — Encounter (HOSPITAL_COMMUNITY): Payer: Self-pay | Admitting: Orthopedic Surgery

## 2015-07-25 NOTE — Anesthesia Postprocedure Evaluation (Signed)
Anesthesia Post Note  Patient: Doctor, general practice  Procedure(s) Performed: Procedure(s) (LRB): HARDWARE REMOVAL RIGHT TIBIA (Right) RIGHT ARTHROSCOPY KNEE WITH PARTIAL SYNOVECTOMY AND MEDIAL MENISCECTOMY (Right)  Patient location during evaluation: PACU Anesthesia Type: General and Regional Level of consciousness: awake and alert Pain management: pain level controlled Vital Signs Assessment: post-procedure vital signs reviewed and stable Respiratory status: spontaneous breathing, nonlabored ventilation, respiratory function stable and patient connected to nasal cannula oxygen Cardiovascular status: blood pressure returned to baseline and stable Postop Assessment: no signs of nausea or vomiting Anesthetic complications: no    Last Vitals:  Filed Vitals:   07/24/15 1522 07/24/15 1530  BP: 111/73   Pulse: 70 72  Temp:    Resp:      Last Pain:  Filed Vitals:   07/24/15 1547  PainSc: 6                  Kennieth Rad

## 2015-07-29 NOTE — Op Note (Signed)
NAMEKNOWLEDGE, ESCANDON               ACCOUNT NO.:  [id number]  MEDICAL RECORD NO.:  [id number]  LOCATION:  [facility]                         FACILITY:  [facility]  PHYSICIAN:  Doralee Albino. Carola Frost, M.D. DATE OF BIRTH:  11-21-1955  DATE OF PROCEDURE:  07/24/2015 DATE OF DISCHARGE:  07/24/2015                              OPERATIVE REPORT   PREOPERATIVE DIAGNOSES: 1. Symptomatic hardware, right tibial plateau. 2. Medial meniscus tear. 3. Extensive synovitis.  POSTOPERATIVE DIAGNOSES: 1. Symptomatic hardware, right tibial plateau. 2. Medial meniscus tear. 3. Extensive synovitis.  PROCEDURES: 1. Removal of deep implant, right tibia. 2. Right knee arthroscopy with partial synovectomy. 3. Arthroscopic partial medial meniscectomy.  SURGEON:  Doralee Albino. Carola Frost, M.D.  ASSISTANT:  Montez Morita, PA-C.  ANESTHESIA:  General.  COMPLICATIONS:  None.  DISPOSITION:  To PACU.  CONDITION:  Stable.  BRIEF SUMMARY OF INDICATIONS FOR PROCEDURE:  Mitchell Hammond is a very pleasant, 60 year old male, who sustained a right knee injury resulting in lateral meniscus tear in addition to a tibial plateau fracture, treated with ORIF.  The patient went on to unite the fracture, but has continued to have difficulty with the knee including pain and now progressive mechanical symptoms with locking, catching, and giving way. MRI confirmed suspicion of a medial tear, which correlated with his positive McMurray's test on physical examination.  I did discuss with him the risks and benefits of surgical treatment including the potential for occult nonunion, need for further surgery, failure to alleviate symptoms, progression of arthritis, DVT, PE, heart attack, stroke, loss of motion, and many others.  After full discussion, the patient did wish to proceed.  BRIEF SUMMARY OF PROCEDURE:  The patient was taken to the operating room after administration of preoperative antibiotics.  His right lower extremity was prepped and  draped in usual sterile fashion.  Two portals were established in the standard position for inferolateral and inferomedial.  Full diagnostic arthroscopy was performed of the knee joint.  Began suprapatellar pouch, where no free loose bodies were identified, but extensive synovitis extending well out onto the articular surface and clearly pinching between the patella and the medial femoral condyle. The synovitis extended down and around into the medial compartment. There was adjacent chondral damage along the medial femoral condyle corresponding with this synovitis.  I continued into the medial compartment.  There was a large anterior partial tear that had flipped up into the anterior compartment, and this was debrided without difficulty.  However, the posterior horn tear which was more extensive was difficult to access, but ultimately was successfully debrided with a series of up biters and then tapered back to a stable suture edge with the arthroscopic shaver.  There were also large chondral grade 2 and 3 changes in the medial femoral condyle, and loose chondral flaps were similarly debrided with the arthroscopic shaver back to a stable chondral surface.  I performed this in similar fashion on the trochlea.  The patella was well preserved.  The lateral compartment was evaluated and appeared to be in excellent condition with a stable repair of lateral meniscus that did not displace from the capsule.  The cartilage surface itself appeared to be in good condition as  well. Following these procedures, the excess fluid was evacuated from the joint with the shaver, and then, standard simple closure performed with 3-0 nylon sutures of the portals.  My assistant did help with positioning of the knee for access during the partial synovectomy as well as the medial meniscectomy.  The old incision was then remade distally and proximally, dissection carried down to the plate exposing the screw  heads, which were then withdrawn.  The plate was removed from the leg, and thorough irrigation performed.  No evidence of occult nonunion was discernible.  Standard layer closure performed, and then, a sterile gently compressive dressing applied.  The patient was taken to the PACU in stable condition.  He did receive regional anesthesia for additional pain control.  PROGNOSIS:  The patient will be weightbearing as tolerated with crutches and unrestricted motion, we will place him back in the office in 10-14 days for removal of his sutures.  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  This will be discussed in detail on followup at the office.     Doralee Albino. Carola Frost, M.D.     [facility]/MEDQ  D:  07/28/2015  T:  07/29/2015  Job:  [id number]

## 2015-12-05 IMAGING — CT CT ANGIO CHEST
1 of 8 series · 17 of 36 positions shown · IV contrast (Iohexol (Omnipaque 350))
Comparison: Chest radiograph of 01/30/2015.  No prior chest CT.

CLINICAL DATA: Left-sided chest pain and shortness of breath. Right
knee surgery approximately 6 weeks ago. Leg swelling.

EXAM:
CT ANGIOGRAPHY CHEST WITH CONTRAST
TECHNIQUE: Multidetector CT imaging of the chest was performed using the
standard protocol during bolus administration of intravenous
contrast. Multiplanar CT image reconstructions and MIPs were
obtained to evaluate the vascular anatomy.
CONTRAST:  100mL OMNIPAQUE IOHEXOL 350 MG/ML SOLN 100 cc of
Omnipaque 350

[Series 406: thins pacs · axial · 0.78mm/px · z∈[+63,+303]mm · 17 of 270 slices shown]
[im 15/270  lung]
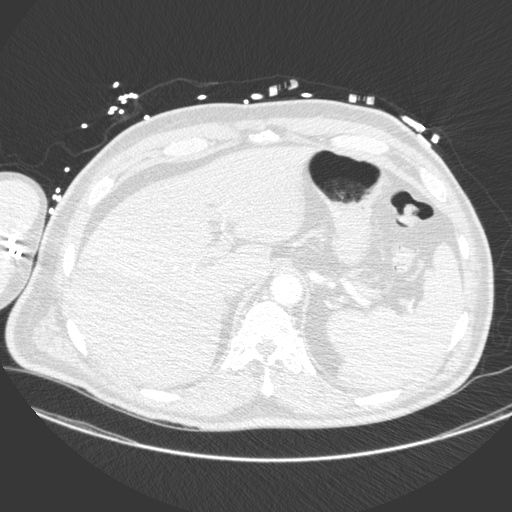
[im 30/270  mediastinal]
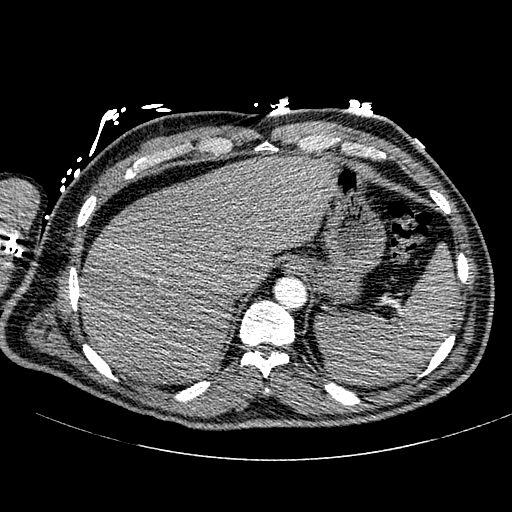
[im 45/270  lung]
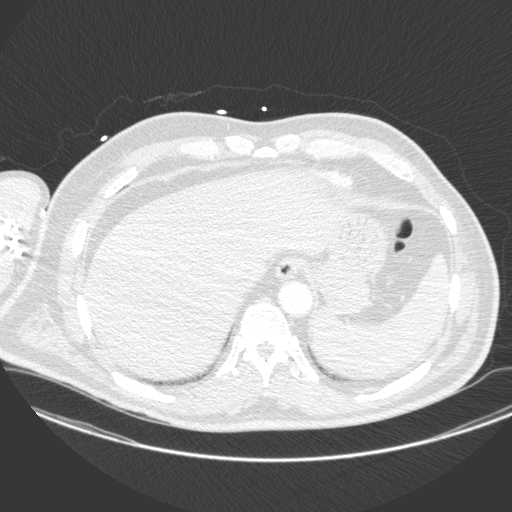
[im 60/270  mediastinal]
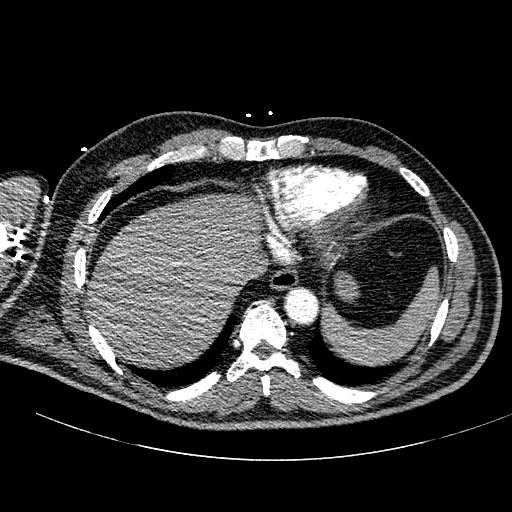
[im 75/270  lung]
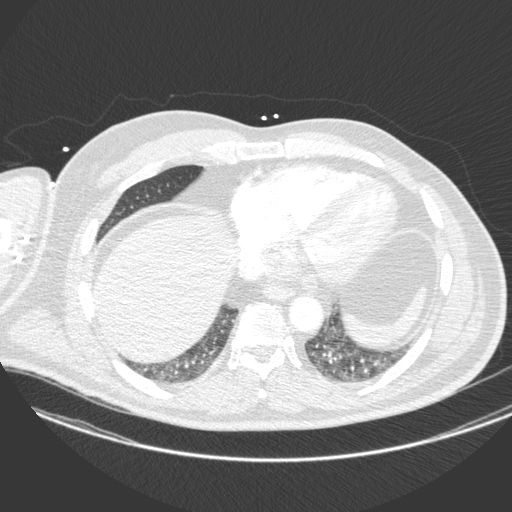
[im 90/270  mediastinal]
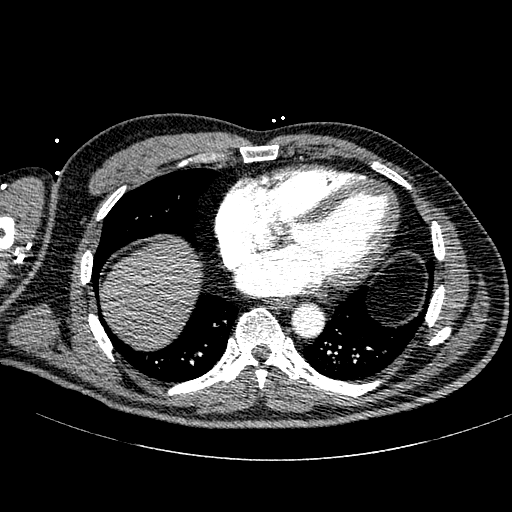
[im 105/270  lung]
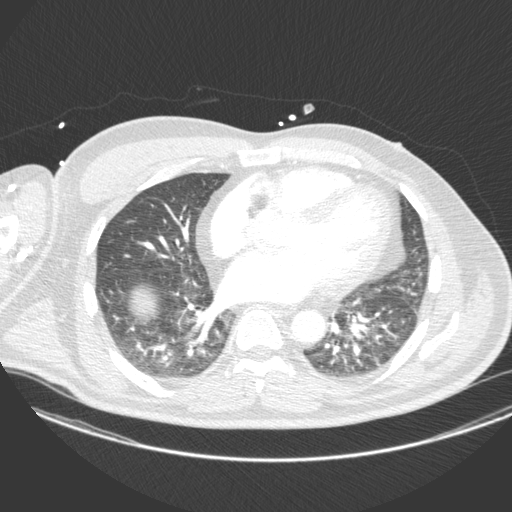
[im 120/270  mediastinal]
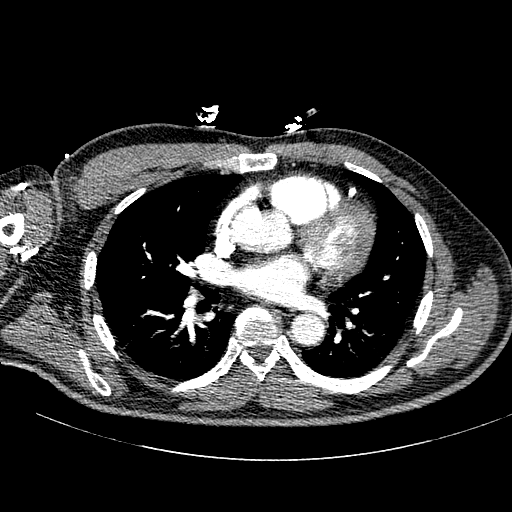
[im 135/270  lung]
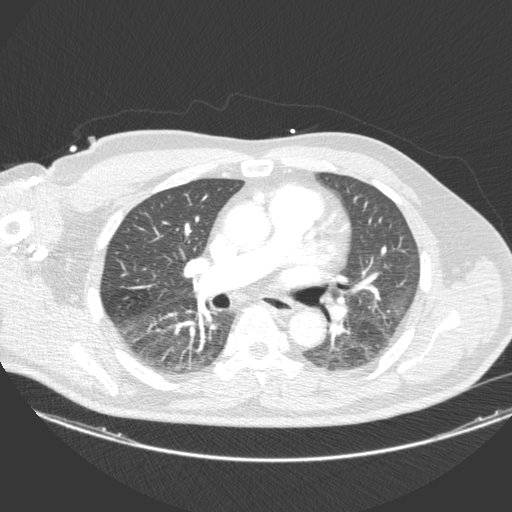
[im 150/270  mediastinal]
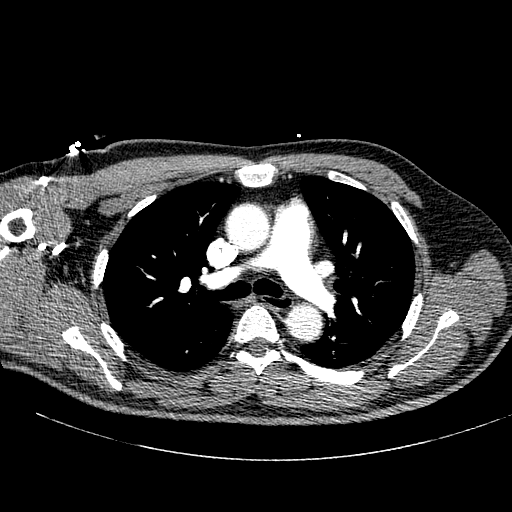
[im 165/270  lung]
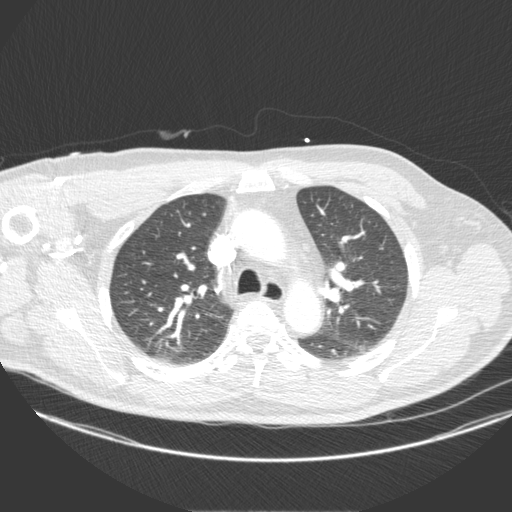
[im 180/270  mediastinal]
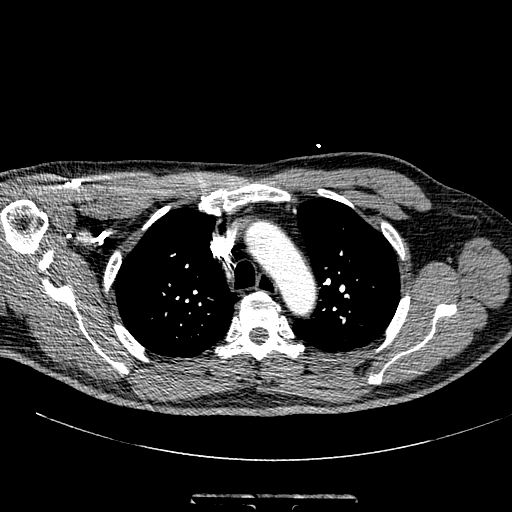
[im 195/270  lung]
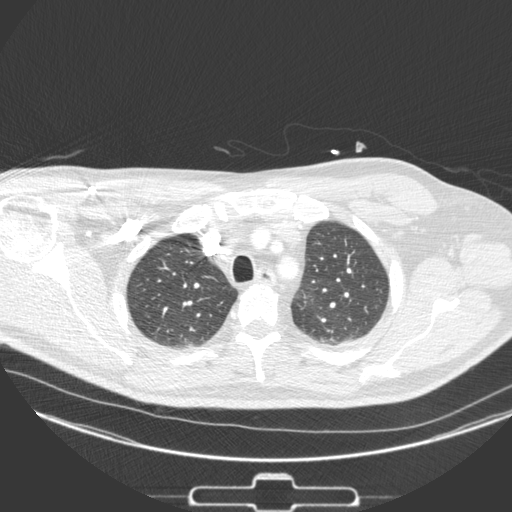
[im 210/270  mediastinal]
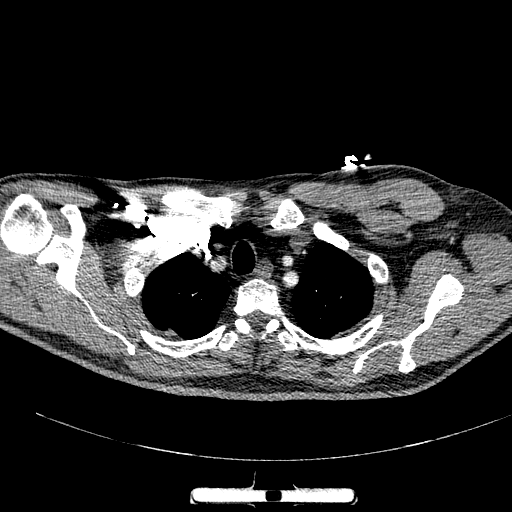
[im 225/270  lung]
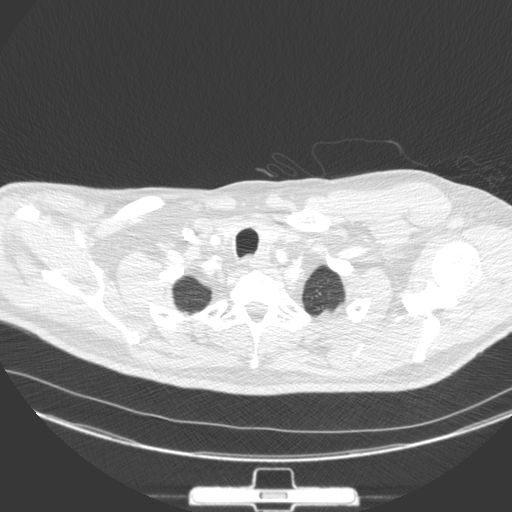
[im 240/270  mediastinal]
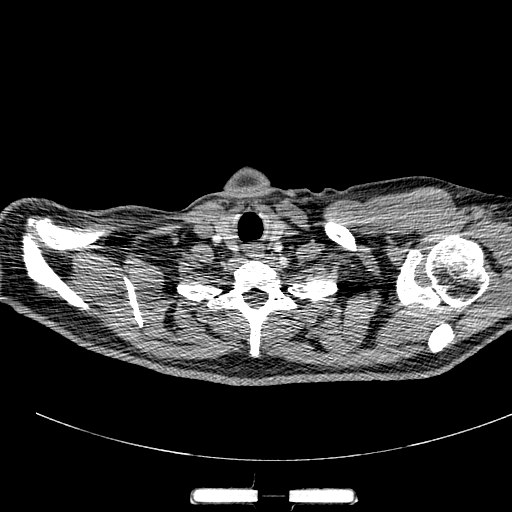
[im 255/270  lung]
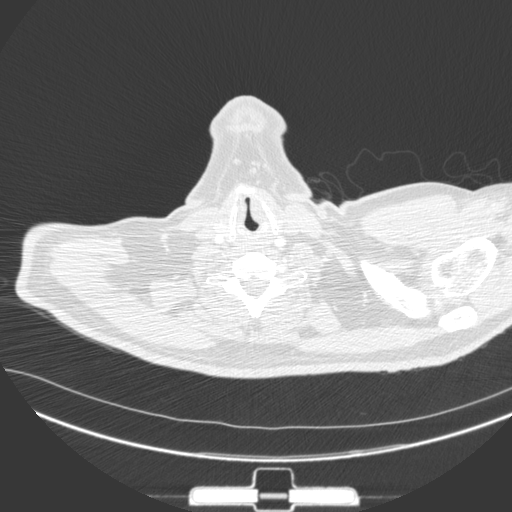

[17 of 36 positions shown; findings below may reference images not displayed]

FINDINGS: Mediastinum/Nodes: The quality of this exam for evaluation of
pulmonary embolism is moderate to good. No evidence of pulmonary
embolism. Normal aortic caliber without dissection. Borderline
cardiomegaly. LAD stent versus calcified plaque, including on image
59 of series 401. No mediastinal or hilar adenopathy. Fluid level in
the thoracic esophagus on image 55 of series 401.

Lungs/Pleura: No pleural fluid. Minimal degradation secondary to
motion and patient right arm position, not raised above the head.
Volume loss in both lower lobes.

Upper abdomen: Normal imaged portions of the liver, spleen, stomach,
pancreas.

Musculoskeletal: No acute osseous abnormality.

Review of the MIP images confirms the above findings.
IMPRESSION: 1. Mild degradation, secondary to motion and patient arm position,
not raised above the head.
2. Given this factor, no pulmonary embolism identified.
3. LAD stent versus coronary artery atherosclerosis.
4. Esophageal air fluid level suggests dysmotility or
gastroesophageal reflux.

## 2015-12-26 DIAGNOSIS — I119 Hypertensive heart disease without heart failure: Secondary | ICD-10-CM | POA: Insufficient documentation

## 2015-12-26 DIAGNOSIS — I10 Essential (primary) hypertension: Secondary | ICD-10-CM

## 2015-12-26 DIAGNOSIS — E088 Diabetes mellitus due to underlying condition with unspecified complications: Secondary | ICD-10-CM

## 2015-12-26 DIAGNOSIS — E1142 Type 2 diabetes mellitus with diabetic polyneuropathy: Secondary | ICD-10-CM | POA: Insufficient documentation

## 2015-12-26 DIAGNOSIS — R079 Chest pain, unspecified: Secondary | ICD-10-CM | POA: Insufficient documentation

## 2015-12-26 DIAGNOSIS — I209 Angina pectoris, unspecified: Secondary | ICD-10-CM | POA: Insufficient documentation

## 2015-12-26 HISTORY — DX: Essential (primary) hypertension: I10

## 2015-12-26 HISTORY — DX: Diabetes mellitus due to underlying condition with unspecified complications: E08.8

## 2015-12-26 HISTORY — DX: Angina pectoris, unspecified: I20.9

## 2016-08-14 DIAGNOSIS — R079 Chest pain, unspecified: Secondary | ICD-10-CM | POA: Diagnosis not present

## 2016-08-14 DIAGNOSIS — E119 Type 2 diabetes mellitus without complications: Secondary | ICD-10-CM | POA: Diagnosis not present

## 2016-08-14 DIAGNOSIS — E785 Hyperlipidemia, unspecified: Secondary | ICD-10-CM | POA: Diagnosis not present

## 2016-08-15 DIAGNOSIS — E785 Hyperlipidemia, unspecified: Secondary | ICD-10-CM | POA: Diagnosis not present

## 2016-08-15 DIAGNOSIS — R079 Chest pain, unspecified: Secondary | ICD-10-CM | POA: Diagnosis not present

## 2016-08-15 DIAGNOSIS — E1142 Type 2 diabetes mellitus with diabetic polyneuropathy: Secondary | ICD-10-CM

## 2016-08-15 DIAGNOSIS — E669 Obesity, unspecified: Secondary | ICD-10-CM | POA: Diagnosis not present

## 2016-08-15 DIAGNOSIS — I2510 Atherosclerotic heart disease of native coronary artery without angina pectoris: Secondary | ICD-10-CM | POA: Diagnosis not present

## 2016-08-15 DIAGNOSIS — E119 Type 2 diabetes mellitus without complications: Secondary | ICD-10-CM | POA: Diagnosis not present

## 2016-08-15 DIAGNOSIS — E559 Vitamin D deficiency, unspecified: Secondary | ICD-10-CM | POA: Diagnosis not present

## 2016-08-19 ENCOUNTER — Telehealth (INDEPENDENT_AMBULATORY_CARE_PROVIDER_SITE_OTHER): Payer: Self-pay | Admitting: Orthopedic Surgery

## 2016-08-19 NOTE — Telephone Encounter (Signed)
Pt requested a call back to sch a knee replacement ASAP.  [phone]

## 2016-08-19 NOTE — Telephone Encounter (Signed)
Looks like August Saucer saw him 12/19/15.

## 2016-08-19 NOTE — Telephone Encounter (Signed)
Patient is wanting to schedule surgery for a knee replacement. Looks like you seen him in August of last year. Does in need to come in for an appt first?

## 2016-08-20 DIAGNOSIS — E1142 Type 2 diabetes mellitus with diabetic polyneuropathy: Secondary | ICD-10-CM | POA: Insufficient documentation

## 2016-08-20 DIAGNOSIS — M7741 Metatarsalgia, right foot: Secondary | ICD-10-CM

## 2016-08-20 DIAGNOSIS — M6240 Contracture of muscle, unspecified site: Secondary | ICD-10-CM | POA: Insufficient documentation

## 2016-08-20 DIAGNOSIS — M7742 Metatarsalgia, left foot: Secondary | ICD-10-CM | POA: Insufficient documentation

## 2016-08-20 HISTORY — DX: Metatarsalgia, right foot: M77.41

## 2016-08-20 HISTORY — DX: Contracture of muscle, unspecified site: M62.40

## 2016-08-20 HISTORY — DX: Type 2 diabetes mellitus with diabetic polyneuropathy: E11.42

## 2016-08-20 NOTE — Telephone Encounter (Signed)
See note from Dr August Saucer. He does need patient to come in first.

## 2016-08-20 NOTE — Telephone Encounter (Signed)
Yes needs to come in for appointment sock can look at his incision and plan accordingly for possible hardware removal first.  Please call thanks

## 2016-08-27 ENCOUNTER — Ambulatory Visit (INDEPENDENT_AMBULATORY_CARE_PROVIDER_SITE_OTHER): Payer: Medicaid Other | Admitting: Orthopedic Surgery

## 2016-08-27 ENCOUNTER — Encounter (INDEPENDENT_AMBULATORY_CARE_PROVIDER_SITE_OTHER): Payer: Self-pay | Admitting: Orthopedic Surgery

## 2016-08-27 DIAGNOSIS — G8929 Other chronic pain: Secondary | ICD-10-CM

## 2016-08-27 DIAGNOSIS — M25561 Pain in right knee: Secondary | ICD-10-CM | POA: Diagnosis not present

## 2016-08-27 MED ORDER — TRAMADOL HCL 50 MG PO TABS: 50.0000 mg | ORAL_TABLET | Freq: Four times a day (QID) | ORAL | 0 refills | Status: DC | PRN

## 2016-08-27 NOTE — Progress Notes (Signed)
Office Visit Note   Patient: Mitchell Hammond           Date of Birth: 12-03-1955           MRN: [id number] Visit Date: 08/27/2016 Requested by: Farrel Gobble, NP No address on file PCP: Farrel Gobble, NP  Subjective: Chief Complaint  Patient presents with  . Right Knee - Pain   Lumbar toe is a 61 year old patient with right knee pain.  He underwent right tibial hardware removal in March 2017.  He had tibial plateau fixation in August 2016.  He reports constant pain in the right knee.  Reports difficulty walking by the end of the day.  Does have history of diabetes.  He relates with a cane.  It is hard for him to walk.  Does have a history of back surgery which became infected.  His last hemoglobin A1c was 7.1 but he states that was "a long time ago".  It is hard for him to sleep because the knee.  He does have a history of having an injection in the knee which helped.  He tried a brace but the pressure on the knee hurts him.  He really can't work with the knee the way it is. HPI: See above              ROS:All systems reviewed are negative as they relate to the chief complaint within the history of present illness.  Patient denies  fevers or chills.   Assessment & Plan: Visit Diagnoses:  1. Chronic pain of right knee     Plan: Impression is symptomatic right knee arthritis.  I looked at him before last year and determined that when he was ready for knee replacement that would be his really best and only surgical option.  Do not think arthroscopy and debridement would be particularly helpful for him.  He is at high risk for complications because of his prior surgery as well as because of his diabetes.  The risks and benefits of surgical intervention are discussed including not limited to infection or vessel damage potential poor skin bridge breakdown loss of range of motion loss of strength.  All questions answered.  Patient desires to proceed with surgery.  I do want to get his latest lab  results in terms of his diabetic management as well as have him see the dentist prior to implantation particularly with his history of back surgery infection.  Follow-Up Instructions: No Follow-up on file.   Orders:  No orders of the defined types were placed in this encounter.  No orders of the defined types were placed in this encounter.     Procedures: No procedures performed   Clinical Data: No additional findings.  Objective: Vital Signs: There were no vitals taken for this visit.  Physical Exam:   Constitutional: Patient appears well-developed HEENT:  Head: Normocephalic Eyes:EOM are normal Neck: Normal range of motion Cardiovascular: Normal rate Pulmonary/chest: Effort normal Neurologic: Patient is alert Skin: Skin is warm Psychiatric: Patient has normal mood and affect    Ortho Exam: Orthopedic exam demonstrates good active and passive range of motion of that right knee he flexes to about 110 he does have a little bit of lateral sided laxity on exam to varus stress at both 0 and 30.  Anterior cruciate ligament PCL feel stable.  Well-healed surgical incision for tibial plateau fixation is noted.  There is no effusion but there is significant patellofemoral crepitus.  Specialty Comments:  No  specialty comments available.  Imaging: No results found.   PMFS History: Patient Active Problem List   Diagnosis Date Noted  . Vitamin D insufficiency 12/20/2014  . IDDM (insulin dependent diabetes mellitus) (HCC)   . CAD (coronary artery disease)   . Chronic back pain   . Tibial plateau fracture 12/18/2014  . Fracture, tibial plateau 12/18/2014   Past Medical History:  Diagnosis Date  . CAD (coronary artery disease)   . Chronic back pain   . Discitis   . Hypertension   . 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 MI (myocardial infarction)    x 2, Multi-link Vision 3.5 x 15 mm BMS in 2009 (mid LAD), balloon 2015  . Vitamin D  insufficiency     Family History  Problem Relation Age of Onset  . Diabetes Mother   . CAD Mother     Past Surgical History:  Procedure Laterality Date  . BACK SURGERY     2014  . CARDIAC CATHETERIZATION     x 2, 2009 & 2015  . COLONOSCOPY    . CORONARY ANGIOPLASTY     stent - 2008, balloon - 2015  . FASCIOTOMY Right 12/19/2014   Procedure: ANTERIOR COMPARTMENTAL FASCIOTOMY;  Surgeon: Myrene Galas, MD;  Location[facility];  Service: Orthopedics;  Laterality: Right;  . HAND SURGERY Left   . HARDWARE REMOVAL Right 07/24/2015   Procedure: HARDWARE REMOVAL RIGHT TIBIA;  Surgeon: Myrene Galas, MD;  Location: [facility] OR;  Service: Orthopedics;  Laterality: Right;  . KNEE ARTHROSCOPY Right 07/24/2015   Procedure: RIGHT ARTHROSCOPY KNEE WITH PARTIAL SYNOVECTOMY AND MEDIAL MENISCECTOMY;  Surgeon: Myrene Galas, MD;  Location: [facility] OR;  Service: Orthopedics;  Laterality: Right;  . ORIF TIBIA PLATEAU Right 12/19/2014   Procedure: OPEN REDUCTION INTERNAL FIXATION (ORIF) RIGHT TIBIAL PLATEAU;  Surgeon: Myrene Galas, MD;  Location: [facility] OR;  Service: Orthopedics;  Laterality: Right;  . TONSILLECTOMY    . VASECTOMY     Social History   Occupational History  . maintenance manager    Social History Main Topics  . Smoking status: Former Smoker    Packs/day: 1.00    Types: Cigarettes    Quit date: 02/20/2007  . Smokeless tobacco: Never Used  . Alcohol use No     Comment: None in years  . Drug use: No  . Sexual activity: Not on file

## 2016-10-27 ENCOUNTER — Telehealth (INDEPENDENT_AMBULATORY_CARE_PROVIDER_SITE_OTHER): Payer: Self-pay | Admitting: Orthopedic Surgery

## 2016-10-27 NOTE — Telephone Encounter (Signed)
Please advise. Request pain meds

## 2016-10-27 NOTE — Telephone Encounter (Signed)
Okay to try Ultram 1 by mouth every 8 hours when necessary pain #30 with no refills please call thanks

## 2016-10-27 NOTE — Telephone Encounter (Signed)
Pt called requesting a RX for pain medicine. He stated he is trying his best to get his medical clearances for surgery but is having a hard time seeing the cardiologist. I am trying to assist pt to get an appt with cardiology. For a dental eval they are requiring him to receive cardiac clearance. CB# [phone]

## 2016-10-28 MED ORDER — TRAMADOL HCL 50 MG PO TABS: 50.0000 mg | ORAL_TABLET | Freq: Three times a day (TID) | ORAL | 0 refills | Status: DC | PRN

## 2016-10-28 NOTE — Telephone Encounter (Signed)
I called rx to pharmacy. Patient advised done.,

## 2016-11-18 ENCOUNTER — Telehealth (INDEPENDENT_AMBULATORY_CARE_PROVIDER_SITE_OTHER): Payer: Self-pay | Admitting: Orthopedic Surgery

## 2016-11-18 NOTE — Telephone Encounter (Signed)
IC to discuss. No answer. LMVM to call to discuss.

## 2016-11-18 NOTE — Telephone Encounter (Signed)
If he is willing to take the risk of having a higher chance of infection due to bad teeth then he can get his surgery otherwise I would recommend that he see a dentist but is his decision but  he is at higher risk

## 2016-11-18 NOTE — Telephone Encounter (Signed)
Pt LVM stating that he finally got to see a cardiologist, but is still unable to make a dentist appt. He stated that since he has medicaid he is having a hard time being able to make a new pt dentist appt and feels as if he is getting the run around. He would like to know if there is anything we can do to help him out? I did make him the cardiologist appt but as for a dental appt I think my hands are tied since its due to his insurance. Please give pt a call. Thank you!

## 2016-11-18 NOTE — Telephone Encounter (Signed)
Any suggestions>?

## 2016-12-09 ENCOUNTER — Encounter (INDEPENDENT_AMBULATORY_CARE_PROVIDER_SITE_OTHER): Payer: Self-pay | Admitting: Orthopaedic Surgery

## 2016-12-09 ENCOUNTER — Ambulatory Visit (INDEPENDENT_AMBULATORY_CARE_PROVIDER_SITE_OTHER): Payer: Medicaid Other

## 2016-12-09 ENCOUNTER — Ambulatory Visit (INDEPENDENT_AMBULATORY_CARE_PROVIDER_SITE_OTHER): Payer: Medicaid Other | Admitting: Orthopaedic Surgery

## 2016-12-09 ENCOUNTER — Telehealth (INDEPENDENT_AMBULATORY_CARE_PROVIDER_SITE_OTHER): Payer: Self-pay | Admitting: Orthopaedic Surgery

## 2016-12-09 DIAGNOSIS — M25561 Pain in right knee: Secondary | ICD-10-CM | POA: Diagnosis not present

## 2016-12-09 DIAGNOSIS — M25512 Pain in left shoulder: Secondary | ICD-10-CM | POA: Diagnosis not present

## 2016-12-09 DIAGNOSIS — S62111A Displaced fracture of triquetrum [cuneiform] bone, right wrist, initial encounter for closed fracture: Secondary | ICD-10-CM | POA: Diagnosis not present

## 2016-12-09 HISTORY — DX: Displaced fracture of triquetrum (cuneiform) bone, right wrist, initial encounter for closed fracture: S62.111A

## 2016-12-09 NOTE — Telephone Encounter (Signed)
Mitchell Hammond is out need Urgent MRI Done in [place] per patients request see message and referral. Are you able to help out with referral?

## 2016-12-09 NOTE — Telephone Encounter (Signed)
IC pt and LMVM to call me to get appt details.  Thursday 12/11/16 at [facility], next door to [facility].  [address] bring ID and ins card.  Arrive 615 pm for a 630 pm appt.

## 2016-12-09 NOTE — Progress Notes (Signed)
Office Visit Note   Patient: Mitchell Hammond           Date of Birth: [birth year]           MRN: [id number] Visit Date: 12/09/2016              Requested by: Farrel Gobble, NP No address on file PCP: Farrel Gobble, NP   Assessment & Plan: Visit Diagnoses:  1. Acute pain of right knee   2. Chip fracture of triquetral bone of right wrist, closed, initial encounter   3. Acute pain of left shoulder     Plan:  1. Right knee pain possible patellar tendon tear. Urgent MRI to evaluate for this. Continue with knee immobilizer and crutches.  2. Right triquetral avulsion fracture. Wrist brace and activity as tolerated. Recommend NSAIDs and ice. 3. Left shoulder pain likely contusion. Recommend symptomatic treatment.  Follow-up after MRI.  Follow-Up Instructions: Return in about 1 week (around 12/16/2016).   Orders:  Orders Placed This Encounter  Procedures  . XR KNEE 3 VIEW RIGHT  . XR Wrist Complete Right  . XR Shoulder Left  . MR Knee Right w/o contrast   No orders of the defined types were placed in this encounter.     Procedures: No procedures performed   Clinical Data: No additional findings.   Subjective: Chief Complaint  Patient presents with  . Right Wrist - Pain  . Right Knee - Pain  . Left Shoulder - Pain    Patient is a 61 year old gentleman who fell down some stairs on 12/07/2016. He is complaining of right knee, right wrist, left shoulder pain. He's been out of work since then. He is endorse saying swelling and moderate pain of the right wrist on the dorsum. He is complaining of extreme right knee pain with difficulty with extension. He is also complaining of pain of the left shoulder with reaching out and abduction of the shoulder. Denies any numbness and tingling. The pain is worse with activity and better with rest.    Review of Systems  Constitutional: Negative.   All other systems reviewed and are negative.    Objective: Vital Signs: There  were no vitals taken for this visit.  Physical Exam  Constitutional: He is oriented to person, place, and time. He appears well-developed and well-nourished.  HENT:  Head: Normocephalic and atraumatic.  Eyes: Pupils are equal, round, and reactive to light.  Neck: Neck supple.  Pulmonary/Chest: Effort normal.  Abdominal: Soft.  Musculoskeletal: Normal range of motion.  Neurological: He is alert and oriented to person, place, and time.  Skin: Skin is warm.  Psychiatric: He has a normal mood and affect. His behavior is normal. Judgment and thought content normal.  Nursing note and vitals reviewed.   Ortho Exam Right knee exam shows tenderness at the insertion of the patellar tendon. There is no palpable defects. He has severe pain with attempted knee extension. He is not able to hold his knee in extension against gravity secondary to pain. Quadriceps tendon is intact. No evidence of patella alta or [place]  Right wrist exam shows moderate swelling over the dorsum with tenderness over the carpus. Pain is warm and well-perfused.  Left shoulder exam shows no focal muscle or sensory deficits. Rotator cuff is grossly intact with mild pain. No impingement signs. Specialty Comments:  No specialty comments available.  Imaging: No results found.   PMFS History: Patient Active Problem List   Diagnosis Date Noted  .  Chip fracture of triquetral bone of right wrist, closed, initial encounter 12/09/2016  . Vitamin D insufficiency 12/20/2014  . IDDM (insulin dependent diabetes mellitus) (HCC)   . CAD (coronary artery disease)   . Chronic back pain   . Tibial plateau fracture 12/18/2014  . Fracture, tibial plateau 12/18/2014   Past Medical History:  Diagnosis Date  . CAD (coronary artery disease)   . Chronic back pain   . Discitis   . Hypertension   . 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 MI (myocardial infarction) (HCC)    x 2, Multi-link Vision 3.5 x 15 mm BMS in  2009 (mid LAD), balloon 2015  . Vitamin D insufficiency     Family History  Problem Relation Age of Onset  . Diabetes Mother   . CAD Mother     Past Surgical History:  Procedure Laterality Date  . BACK SURGERY     2014  . CARDIAC CATHETERIZATION     x 2, 2009 & 2015  . COLONOSCOPY    . CORONARY ANGIOPLASTY     stent - 2008, balloon - 2015  . FASCIOTOMY Right 12/19/2014   Procedure: ANTERIOR COMPARTMENTAL FASCIOTOMY;  Surgeon: Myrene Galas, MD;  Location: [facility] OR;  Service: Orthopedics;  Laterality: Right;  . HAND SURGERY Left   . HARDWARE REMOVAL Right 07/24/2015   Procedure: HARDWARE REMOVAL RIGHT TIBIA;  Surgeon: Myrene Galas, MD;  Location[facility] OR;  Service: Orthopedics;  Laterality: Right;  . KNEE ARTHROSCOPY Right 07/24/2015   Procedure: RIGHT ARTHROSCOPY KNEE WITH PARTIAL SYNOVECTOMY AND MEDIAL MENISCECTOMY;  Surgeon: Myrene Galas, MD;  Location: [facility] OR;  Service: Orthopedics;  Laterality: Right;  . ORIF TIBIA PLATEAU Right 12/19/2014   Procedure: OPEN REDUCTION INTERNAL FIXATION (ORIF) RIGHT TIBIAL PLATEAU;  Surgeon: Myrene Galas, MD;  Location: [facility] OR;  Service: Orthopedics;  Laterality: Right;  . TONSILLECTOMY    . VASECTOMY     Social History   Occupational History  . maintenance manager    Social History Main Topics  . Smoking status: Former Smoker    Packs/day: 1.00    Types: Cigarettes    Quit date: 02/20/2007  . Smokeless tobacco: Never Used  . Alcohol use No     Comment: None in years  . Drug use: No  . Sexual activity: Not on file

## 2016-12-09 NOTE — Telephone Encounter (Signed)
Pt called states [facility] cannot do MRI until 8/5 or 8/6. Pt states he wants MRI in [place] it is closer to his home and they may be able to get him in sooner. Please call pt.

## 2016-12-10 ENCOUNTER — Telehealth (INDEPENDENT_AMBULATORY_CARE_PROVIDER_SITE_OTHER): Payer: Self-pay | Admitting: Orthopaedic Surgery

## 2016-12-10 NOTE — Telephone Encounter (Signed)
Mitchell Hammond with [facility] called  Needing order for MRI faxed. The fax# is [phone]  The ph# is [phone]

## 2016-12-10 NOTE — Telephone Encounter (Signed)
I spoke with patient and advised of appt date/time.

## 2016-12-10 NOTE — Telephone Encounter (Signed)
This is done.

## 2016-12-11 ENCOUNTER — Encounter: Payer: Self-pay | Admitting: Orthopaedic Surgery

## 2016-12-15 ENCOUNTER — Ambulatory Visit (INDEPENDENT_AMBULATORY_CARE_PROVIDER_SITE_OTHER): Payer: Medicaid Other | Admitting: Orthopaedic Surgery

## 2016-12-15 ENCOUNTER — Encounter (INDEPENDENT_AMBULATORY_CARE_PROVIDER_SITE_OTHER): Payer: Self-pay | Admitting: Orthopaedic Surgery

## 2016-12-15 DIAGNOSIS — S82001A Unspecified fracture of right patella, initial encounter for closed fracture: Secondary | ICD-10-CM

## 2016-12-15 NOTE — Progress Notes (Signed)
Office Visit Note   Patient: Mitchell Hammond           Date of Birth: 04-21-1956           MRN: [id number] Visit Date: 12/15/2016              Requested by: Farrel Gobble, NP No address on file PCP: Farrel Gobble, NP   Assessment & Plan: Visit Diagnoses:  1. Closed nondisplaced fracture of right patella, unspecified fracture morphology, initial encounter     Plan: MRI shows a nondisplaced inferior pole of the patella fracture. The patellar tendon is intact. His extensor mechanism is intact. We will treat this nonoperatively. Bledsoe brace prescription was given today follow up in 3 weeks for recheck. Anticipate beginning physical therapy at that time  Follow-Up Instructions: Return in about 3 weeks (around 01/05/2017).   Orders:  No orders of the defined types were placed in this encounter.  No orders of the defined types were placed in this encounter.     Procedures: No procedures performed   Clinical Data: No additional findings.   Subjective: Chief Complaint  Patient presents with  . Right Knee - Pain, Follow-up    Patient follows up today for his MRI of his right knee. He states that he has some pain and swelling. He's ambulating with crutches and a knee immobilizer.    Review of Systems  Constitutional: Negative.   All other systems reviewed and are negative.    Objective: Vital Signs: There were no vitals taken for this visit.  Physical Exam  Constitutional: He is oriented to person, place, and time. He appears well-developed and well-nourished.  Pulmonary/Chest: Effort normal.  Abdominal: Soft.  Neurological: He is alert and oriented to person, place, and time.  Skin: Skin is warm.  Psychiatric: He has a normal mood and affect. His behavior is normal. Judgment and thought content normal.  Nursing note and vitals reviewed.   Ortho Exam Right knee exam is stable. Specialty Comments:  No specialty comments available.  Imaging: No results  found.   PMFS History: Patient Active Problem List   Diagnosis Date Noted  . Chip fracture of triquetral bone of right wrist, closed, initial encounter 12/09/2016  . Vitamin D insufficiency 12/20/2014  . IDDM (insulin dependent diabetes mellitus) (HCC)   . CAD (coronary artery disease)   . Chronic back pain   . Tibial plateau fracture 12/18/2014  . Fracture, tibial plateau 12/18/2014   Past Medical History:  Diagnosis Date  . CAD (coronary artery disease)   . Chronic back pain   . Discitis   . Hypertension   . 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 MI (myocardial infarction) (HCC)    x 2, Multi-link Vision 3.5 x 15 mm BMS in 2009 (mid LAD), balloon 2015  . Vitamin D insufficiency     Family History  Problem Relation Age of Onset  . Diabetes Mother   . CAD Mother     Past Surgical History:  Procedure Laterality Date  . BACK SURGERY     2014  . CARDIAC CATHETERIZATION     x 2, 2009 & 2015  . COLONOSCOPY    . CORONARY ANGIOPLASTY     stent - 2008, balloon - 2015  . FASCIOTOMY Right 12/19/2014   Procedure: ANTERIOR COMPARTMENTAL FASCIOTOMY;  Surgeon: Myrene Galas, MD;  Location: [facility] OR;  Service: Orthopedics;  Laterality: Right;  . HAND SURGERY Left   .  HARDWARE REMOVAL Right 07/24/2015   Procedure: HARDWARE REMOVAL RIGHT TIBIA;  Surgeon: Myrene Galas, MD;  Location: [facility] OR;  Service: Orthopedics;  Laterality: Right;  . KNEE ARTHROSCOPY Right 07/24/2015   Procedure: RIGHT ARTHROSCOPY KNEE WITH PARTIAL SYNOVECTOMY AND MEDIAL MENISCECTOMY;  Surgeon: Myrene Galas, MD;  Location: [facility] OR;  Service: Orthopedics;  Laterality: Right;  . ORIF TIBIA PLATEAU Right 12/19/2014   Procedure: OPEN REDUCTION INTERNAL FIXATION (ORIF) RIGHT TIBIAL PLATEAU;  Surgeon: Myrene Galas, MD;  Location: [facility] OR;  Service: Orthopedics;  Laterality: Right;  . TONSILLECTOMY    . VASECTOMY     Social History   Occupational History  . maintenance manager    Social History Main  Topics  . Smoking status: Former Smoker    Packs/day: 1.00    Types: Cigarettes    Quit date: 02/20/2007  . Smokeless tobacco: Never Used  . Alcohol use No     Comment: None in years  . Drug use: No  . Sexual activity: Not on file

## 2016-12-19 ENCOUNTER — Telehealth (INDEPENDENT_AMBULATORY_CARE_PROVIDER_SITE_OTHER): Payer: Self-pay | Admitting: Orthopaedic Surgery

## 2016-12-19 NOTE — Telephone Encounter (Signed)
12/15/2016 OV note faxed to [employer] [phone]

## 2017-01-05 ENCOUNTER — Ambulatory Visit (INDEPENDENT_AMBULATORY_CARE_PROVIDER_SITE_OTHER): Payer: Medicaid Other | Admitting: Orthopaedic Surgery

## 2017-01-12 ENCOUNTER — Ambulatory Visit (INDEPENDENT_AMBULATORY_CARE_PROVIDER_SITE_OTHER): Payer: Medicaid Other | Admitting: Orthopaedic Surgery

## 2017-01-12 ENCOUNTER — Encounter (INDEPENDENT_AMBULATORY_CARE_PROVIDER_SITE_OTHER): Payer: Self-pay | Admitting: Orthopaedic Surgery

## 2017-01-12 DIAGNOSIS — S82001A Unspecified fracture of right patella, initial encounter for closed fracture: Secondary | ICD-10-CM | POA: Diagnosis not present

## 2017-01-12 DIAGNOSIS — G8929 Other chronic pain: Secondary | ICD-10-CM | POA: Diagnosis not present

## 2017-01-12 DIAGNOSIS — M25512 Pain in left shoulder: Secondary | ICD-10-CM

## 2017-01-12 MED ORDER — TRAMADOL HCL 50 MG PO TABS: 50.0000 mg | ORAL_TABLET | Freq: Three times a day (TID) | ORAL | 0 refills | Status: DC | PRN

## 2017-01-12 NOTE — Progress Notes (Signed)
Office Visit Note   Patient: Mitchell Hammond           Date of Birth: 25-Sep-1955           MRN: [id number] Visit Date: 01/12/2017              Requested by: Farrel Gobble, NP No address on file PCP: Farrel Gobble, NP   Assessment & Plan: Visit Diagnoses:  1. Chronic left shoulder pain   2. Closed nondisplaced fracture of right patella, unspecified fracture morphology, initial encounter     Plan: MRI left shoulder to rule out rotator cuff tear. Tramadol refilled. Again physical therapy for right knee strengthening and mobilization. Follow-up after the MRI  Follow-Up Instructions: Return in about 2 weeks (around 01/26/2017).   Orders:  Orders Placed This Encounter  Procedures  . MR Shoulder Left w/o contrast   Meds ordered this encounter  Medications  . traMADol (ULTRAM) 50 MG tablet    Sig: Take 1 tablet (50 mg total) by mouth every 8 (eight) hours as needed.    Dispense:  30 tablet    Refill:  0  . traMADol (ULTRAM) 50 MG tablet    Sig: Take 1 tablet (50 mg total) by mouth every 8 (eight) hours as needed.    Dispense:  30 tablet    Refill:  0      Procedures: No procedures performed   Clinical Data: No additional findings.   Subjective: Chief Complaint  Patient presents with  . Left Shoulder - Pain, Follow-up  . Right Knee - Pain, Follow-up  . Right Wrist - Pain, Follow-up    Patient follows up today for his right patella fracture and left shoulder pain. His right wrist is doing much better. He is mainly complaining of left shoulder pain. His right knee is also feeling better.    Review of Systems  Constitutional: Negative.   All other systems reviewed and are negative.    Objective: Vital Signs: There were no vitals taken for this visit.  Physical Exam  Constitutional: He is oriented to person, place, and time. He appears well-developed and well-nourished.  Pulmonary/Chest: Effort normal.  Abdominal: Soft.  Neurological: He is alert and  oriented to person, place, and time.  Skin: Skin is warm.  Psychiatric: He has a normal mood and affect. His behavior is normal. Judgment and thought content normal.  Nursing note and vitals reviewed.   Ortho Exam Left shoulder exam shows mildly positive and he can testing. Positive impingement signs. Right knee exam shows improved range of motion and quadriceps strength. Specialty Comments:  No specialty comments available.  Imaging: No results found.   PMFS History: Patient Active Problem List   Diagnosis Date Noted  . Chip fracture of triquetral bone of right wrist, closed, initial encounter 12/09/2016  . Vitamin D insufficiency 12/20/2014  . IDDM (insulin dependent diabetes mellitus) (HCC)   . CAD (coronary artery disease)   . Chronic back pain   . Tibial plateau fracture 12/18/2014  . Fracture, tibial plateau 12/18/2014   Past Medical History:  Diagnosis Date  . CAD (coronary artery disease)   . Chronic back pain   . Discitis   . Hypertension   . 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 MI (myocardial infarction) (HCC)    x 2, Multi-link Vision 3.5 x 15 mm BMS in 2009 (mid LAD), balloon 2015  . Vitamin D insufficiency     Family History  Problem Relation Age of Onset  . Diabetes Mother   . CAD Mother     Past Surgical History:  Procedure Laterality Date  . BACK SURGERY     2014  . CARDIAC CATHETERIZATION     x 2, 2009 & 2015  . COLONOSCOPY    . CORONARY ANGIOPLASTY     stent - 2008, balloon - 2015  . FASCIOTOMY Right 12/19/2014   Procedure: ANTERIOR COMPARTMENTAL FASCIOTOMY;  Surgeon: Myrene Galas, MD;  Location: [facility] OR;  Service: Orthopedics;  Laterality: Right;  . HAND SURGERY Left   . HARDWARE REMOVAL Right 07/24/2015   Procedure: HARDWARE REMOVAL RIGHT TIBIA;  Surgeon: Myrene Galas, MD;  Location: [facility] OR;  Service: Orthopedics;  Laterality: Right;  . KNEE ARTHROSCOPY Right 07/24/2015   Procedure: RIGHT ARTHROSCOPY KNEE WITH PARTIAL  SYNOVECTOMY AND MEDIAL MENISCECTOMY;  Surgeon: Myrene Galas, MD;  Location: [facility] OR;  Service: Orthopedics;  Laterality: Right;  . ORIF TIBIA PLATEAU Right 12/19/2014   Procedure: OPEN REDUCTION INTERNAL FIXATION (ORIF) RIGHT TIBIAL PLATEAU;  Surgeon: Myrene Galas, MD;  Location: [facility] OR;  Service: Orthopedics;  Laterality: Right;  . TONSILLECTOMY    . VASECTOMY     Social History   Occupational History  . maintenance manager    Social History Main Topics  . Smoking status: Former Smoker    Packs/day: 1.00    Types: Cigarettes    Quit date: 02/20/2007  . Smokeless tobacco: Never Used  . Alcohol use No     Comment: None in years  . Drug use: No  . Sexual activity: Not on file

## 2017-01-26 ENCOUNTER — Ambulatory Visit (INDEPENDENT_AMBULATORY_CARE_PROVIDER_SITE_OTHER): Payer: Medicaid Other | Admitting: Orthopaedic Surgery

## 2017-03-23 ENCOUNTER — Telehealth (INDEPENDENT_AMBULATORY_CARE_PROVIDER_SITE_OTHER): Payer: Self-pay | Admitting: Orthopaedic Surgery

## 2017-03-23 NOTE — Telephone Encounter (Signed)
Received VM fromNatalie @ [employer] called checking status of request for records. I called her back [phone], and spoke with her,advised we did not have their request.  She will re fax

## 2019-06-15 DIAGNOSIS — J3489 Other specified disorders of nose and nasal sinuses: Secondary | ICD-10-CM | POA: Diagnosis not present

## 2019-07-05 ENCOUNTER — Encounter: Payer: Self-pay | Admitting: Legal Medicine

## 2019-07-05 ENCOUNTER — Ambulatory Visit (INDEPENDENT_AMBULATORY_CARE_PROVIDER_SITE_OTHER): Payer: Medicare Other | Admitting: Legal Medicine

## 2019-07-05 ENCOUNTER — Other Ambulatory Visit: Payer: Self-pay

## 2019-07-05 DIAGNOSIS — R6884 Jaw pain: Secondary | ICD-10-CM | POA: Diagnosis not present

## 2019-07-05 DIAGNOSIS — M2669 Other specified disorders of temporomandibular joint: Secondary | ICD-10-CM | POA: Diagnosis not present

## 2019-07-05 HISTORY — DX: Other specified disorders of temporomandibular joint: M26.69

## 2019-07-05 MED ORDER — HYDROCODONE-ACETAMINOPHEN 5-325 MG PO TABS: 1.0000 | ORAL_TABLET | Freq: Four times a day (QID) | ORAL | Status: DC | PRN

## 2019-07-05 MED ORDER — HYDROCODONE-ACETAMINOPHEN 5-325 MG PO TABS: 1.0000 | ORAL_TABLET | Freq: Four times a day (QID) | ORAL | 0 refills | Status: DC | PRN

## 2019-07-05 NOTE — Progress Notes (Signed)
Acute Office Visit  Subjective:    Patient ID: Mitchell Hammond, male    DOB: 1955-12-06, 64 y.o.   MRN: [id number]  Chief Complaint  Patient presents with  . Jaw Pain    HPI Patient is in today for jaw pain.  Patient had recurrent pain right jaw into maxillary area.  He had antibiotics only .No dental work.  6 months ago increased pain and Popping with grinding.  Trouble eating difficulty opening mouth.  Past Medical History:  Diagnosis Date  . CAD (coronary artery disease)   . Chronic back pain   . Discitis   . Hypertension   . 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 MI (myocardial infarction) ([place])    x 2, Multi-link Vision 3.5 x 15 mm BMS in 2009 (mid LAD), balloon 2015  . Vitamin D insufficiency     Past Surgical History:  Procedure Laterality Date  . BACK SURGERY     2014  . CARDIAC CATHETERIZATION     x 2, 2009 & 2015  . COLONOSCOPY    . CORONARY ANGIOPLASTY     stent - 2008, balloon - 2015  . FASCIOTOMY Right 12/19/2014   Procedure: ANTERIOR COMPARTMENTAL FASCIOTOMY;  Surgeon: Altamese Port Richey, MD;  Location: [facility];  Service: Orthopedics;  Laterality: Right;  . HAND SURGERY Left   . HARDWARE REMOVAL Right 07/24/2015   Procedure: HARDWARE REMOVAL RIGHT TIBIA;  Surgeon: Altamese [place], MD;  Location: [facility];  Service: Orthopedics;  Laterality: Right;  . KNEE ARTHROSCOPY Right 07/24/2015   Procedure: RIGHT ARTHROSCOPY KNEE WITH PARTIAL SYNOVECTOMY AND MEDIAL MENISCECTOMY;  Surgeon: Altamese Lyons, MD;  Location: [facility];  Service: Orthopedics;  Laterality: Right;  . ORIF TIBIA PLATEAU Right 12/19/2014   Procedure: OPEN REDUCTION INTERNAL FIXATION (ORIF) RIGHT TIBIAL PLATEAU;  Surgeon: Altamese , MD;  Location: [facility];  Service: Orthopedics;  Laterality: Right;  . TONSILLECTOMY    . VASECTOMY      Family History  Problem Relation Age of Onset  . Diabetes Mother   . CAD Mother   . Liver disease Father     Social History   Socioeconomic History  .  Marital status: Married    Spouse name: Not on file  . Number of children: 2  . Years of education: Not on file  . Highest education level: Not on file  Occupational History  . Occupation: disabled  Tobacco Use  . Smoking status: Former Smoker    Packs/day: 1.00    Types: Cigarettes    Quit date: 02/20/2007    Years since quitting: 12.3  . Smokeless tobacco: Never Used  Substance and Sexual Activity  . Alcohol use: No    Alcohol/week: 0.0 standard drinks    Comment: None in years  . Drug use: No  . Sexual activity: Not on file  Other Topics Concern  . Not on file  Social History Narrative   Pt lives with wife and daughter in [place].    Social Determinants of Health   Financial Resource Strain:   . Difficulty of Paying Living Expenses: Not on file  Food Insecurity:   . Worried About Charity fundraiser in the Last Year: Not on file  . Ran Out of Food in the Last Year: Not on file  Transportation Needs:   . Lack of Transportation (Medical): Not on file  . Lack of Transportation (Non-Medical): Not on file  Physical Activity:   . Days of Exercise  per Week: Not on file  . Minutes of Exercise per Session: Not on file  Stress:   . Feeling of Stress : Not on file  Social Connections:   . Frequency of Communication with Friends and Family: Not on file  . Frequency of Social Gatherings with Friends and Family: Not on file  . Attends Religious Services: Not on file  . Active Member of Clubs or Organizations: Not on file  . Attends Archivist Meetings: Not on file  . Marital Status: Not on file  Intimate Partner Violence:   . Fear of Current or Ex-Partner: Not on file  . Emotionally Abused: Not on file  . Physically Abused: Not on file  . Sexually Abused: Not on file    Outpatient Medications Prior to Visit  Medication Sig Dispense Refill  . aspirin EC 81 MG tablet Take 81 mg by mouth daily.    Marland Kitchen atorvastatin (LIPITOR) 40 MG tablet Take 40 mg by mouth daily at 6  PM.     . fenofibrate (TRICOR) 145 MG tablet take 1 tablet by mouth once daily TO LOWER TRIGLYCERIDES  0  . gabapentin (NEURONTIN) 600 MG tablet Take 600 mg by mouth 2 (two) times daily.   0  . glipiZIDE (GLUCOTROL XL) 10 MG 24 hr tablet Take 10 mg by mouth daily.  0  . metFORMIN (GLUCOPHAGE) 1000 MG tablet Take 1,000 mg by mouth 2 (two) times daily with a meal.    . methocarbamol (ROBAXIN) 500 MG tablet Take 1-2 tablets (500-1,000 mg total) by mouth every 6 (six) hours as needed for muscle spasms. 90 tablet 1  . metoprolol succinate (TOPROL-XL) 25 MG 24 hr tablet Take 25 mg by mouth daily.    . nitroGLYCERIN (NITROSTAT) 0.4 MG SL tablet Place 0.4 mg under the tongue every 5 (five) minutes as needed for chest pain.    Marland Kitchen NOVOLOG MIX 70/30 FLEXPEN (70-30) 100 UNIT/ML FlexPen inject 30 units subcutaneously twice a day  0  . sitaGLIPtin (JANUVIA) 100 MG tablet     . vitamin C (VITAMIN C) 500 MG tablet Take 1 tablet (500 mg total) by mouth 2 (two) times daily. 60 tablet 2  . Vitamin D, Ergocalciferol, (DRISDOL) 50000 UNITS CAPS capsule Take 1 capsule (50,000 Units total) by mouth every 7 (seven) days. 12 capsule 0  . insulin NPH-regular Human (NOVOLIN 70/30) (70-30) 100 UNIT/ML injection Inject 30 Units into the skin 2 (two) times daily with a meal.     . Blood Glucose Monitoring Suppl (ACCU-CHEK GUIDE) w/Device KIT USE TO CHECK BLOOD SUGAR 3 TIMES DAILY    . ACCU-CHEK AVIVA PLUS test strip   0  . buPROPion (WELLBUTRIN XL) 300 MG 24 hr tablet Take 300 mg by mouth at bedtime.  0  . clopidogrel (PLAVIX) 75 MG tablet Take 75 mg by mouth daily.  0  . losartan (COZAAR) 50 MG tablet Take 50 mg by mouth daily.  0  . ondansetron (ZOFRAN) 4 MG tablet Take 4-8 mg by mouth every 6 (six) hours as needed for nausea or vomiting.   0  . traMADol (ULTRAM) 50 MG tablet Take 1 tablet (50 mg total) by mouth every 8 (eight) hours as needed. 30 tablet 0  . traMADol (ULTRAM) 50 MG tablet Take 1 tablet (50 mg total) by  mouth every 8 (eight) hours as needed. 30 tablet 0  . zolpidem (AMBIEN) 10 MG tablet Take 10 mg by mouth.     No facility-administered medications  prior to visit.    No Known Allergies  Review of Systems  Constitutional: Negative.   HENT: Negative.        Pain right TMJ  Eyes: Negative.   Respiratory: Negative.   Cardiovascular: Negative.   Gastrointestinal: Negative.   Genitourinary: Negative.   Musculoskeletal:       Limp  Skin: Negative.   Neurological: Negative.   Psychiatric/Behavioral: Negative.        Objective:    Physical Exam Vitals reviewed.  Constitutional:      Appearance: Normal appearance.  HENT:     Head: Normocephalic and atraumatic.     Jaw: Tenderness, pain on movement and malocclusion present.     Comments: Pain over right TMJ.  With some popping.  The TM joint is loose. Cardiovascular:     Rate and Rhythm: Normal rate and regular rhythm.     Pulses: Normal pulses.     Heart sounds: Normal heart sounds.  Pulmonary:     Effort: Pulmonary effort is normal.     Breath sounds: Normal breath sounds.  Abdominal:     General: Abdomen is flat.     Palpations: Abdomen is soft.  Musculoskeletal:     Cervical back: Normal range of motion and neck supple.  Neurological:     General: No focal deficit present.     Mental Status: He is alert and oriented to person, place, and time. Mental status is at baseline.     BP (!) 150/82   Pulse 76   Temp 98.4 F (36.9 C)   Resp 16   Ht 5' 3.98" (1.625 m)   Wt 196 lb (88.9 kg)   SpO2 94%   BMI 33.67 kg/m  Wt Readings from Last 3 Encounters:  07/05/19 196 lb (88.9 kg)  07/24/15 185 lb (83.9 kg)  01/30/15 180 lb (81.6 kg)    Health Maintenance Due  Topic Date Due  . Hepatitis C Screening  12-Jan-1956  . FOOT EXAM  01/25/1964  . OPHTHALMOLOGY EXAM  01/25/1964  . HIV Screening  01/24/1969  . TETANUS/TDAP  01/24/1973  . COLONOSCOPY  01/25/2004  . HEMOGLOBIN A1C  06/20/2015  . INFLUENZA VACCINE   12/18/2018  . PNA vac Low Risk Adult (1 of 2 - PCV13) 01/25/2019    There are no preventive care reminders to display for this patient.   Lab Results  Component Value Date   TSH 2.829 12/18/2014   Lab Results  Component Value Date   WBC 7.0 07/24/2015   HGB 15.5 07/24/2015   HCT 44.8 07/24/2015   MCV 83.3 07/24/2015   PLT 169 07/24/2015   Lab Results  Component Value Date   NA 141 07/24/2015   K 3.7 07/24/2015   CO2 25 07/24/2015   GLUCOSE 138 (H) 07/24/2015   BUN 9 07/24/2015   CREATININE 0.91 07/24/2015   BILITOT 1.0 12/18/2014   ALKPHOS 81 12/18/2014   AST 25 12/18/2014   ALT 29 12/18/2014   PROT 6.2 (L) 12/18/2014   ALBUMIN 3.5 12/18/2014   CALCIUM 9.3 07/24/2015   ANIONGAP 12 07/24/2015   No results found for: CHOL No results found for: HDL No results found for: LDLCALC No results found for: TRIG No results found for: CHOLHDL Lab Results  Component Value Date   HGBA1C 7.6 (H) 12/18/2014       Assessment & Plan:   Problem List Items Addressed This Visit      Other   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 DISCONTD: HYDROcodone-acetaminophen (NORCO/VICODIN) 5-325 MG per tablet 1 tablet  . HYDROcodone-acetaminophen (NORCO/VICODIN) 5-325 MG tablet    Sig: Take 1 tablet by mouth every 6 (six) hours as needed for moderate pain.    Dispense:  30 tablet    Refill:  0     Reinaldo Meeker, MD

## 2019-07-05 NOTE — Patient Instructions (Signed)
Jaw Dislocation  A jaw dislocation is when the joint that connects the jaw bones (temporomandibular joint) moves out of place (gets dislocated). This can be very painful. Your jaw must be moved back into place by your doctor (manual reduction). What are the causes? Common causes of this condition include:  A hard, direct hit or injury (trauma) to the jaw.  Opening the mouth too wide. This can be caused by: ? Eating. ? Yawning. ? Throwing up (vomiting). ? Having a dental procedure. ? Receiving medicine by mouth to make you fall asleep (general anesthetic). What increases the risk? You are more likely to get this condition if:  Your jaw has moved out of place in the past.  You play contact sports.  You have a jaw that is loose or can move beyond the normal range. What are the signs or symptoms? Symptoms of this condition may include:  Very bad pain.  Not being able to move your jaw or close your mouth all the way.  Feeling that your teeth do not line up as they normally do when you bite.  Drooling.  Trouble speaking or swallowing. How is this treated? The most common treatment for this condition is to have your doctor move your jaw back into place. Your doctor may then wrap a bandage around your head and jaw. This will help to hold your jaw in place while it heals. Your doctor may also recommend:  Medicines to reduce pain and swelling.  Medicines to relax your muscles.  A neck brace to support your jaw. Follow these instructions at home: Managing pain, stiffness, and swelling   If told, put ice on the injured area: ? Put ice in a plastic bag. ? Place a towel between your skin and the bag. ? Leave the ice on for 20 minutes, 2-3 times a day. If you have a neck brace or jaw bandage:  Wear the brace or bandage as told by your doctor. Remove it only as told by your doctor.  Keep the brace or bandage clean.  If the brace or bandage is not waterproof: ? Do not let it  get wet. ? Cover it with a watertight covering when you take a bath or shower. Eating and drinking  Follow instructions from your doctor about what you can eat and drink while your jaw is healing. You may be asked to: ? Eat soft foods. ? Eat liquid food. ? Eat food that has been cut into small pieces. Activity  Do not open your mouth wide until your doctor says that you can.  Rest your jaw as told by your doctor.  Avoid activities that are like the one that caused your injury.  When you sneeze or yawn, put a hand under your jaw. This will keep your mouth from opening too wide. General instructions  Take over-the-counter and prescription medicines only as told by your doctor.  Do not take baths, swim, or use a hot tub until your doctor approves. Ask your doctor if you may take showers. You may only be allowed to take sponge baths.  Keep all follow-up visits as told by your doctor. This is important. Contact a doctor if:  You have pain that gets worse, and medicines do not help your pain. Get help right away if:  Your jaw moves out of place again.  You have trouble breathing. Summary  A jaw dislocation is when the joint that connects the jaw bones (temporomandibular joint) moves out of place.    Jaw dislocation can be very painful.  The most common treatment for this condition is for your jaw to be moved back into place by your doctor (manual reduction). This information is not intended to replace advice given to you by your health care provider. Make sure you discuss any questions you have with your health care provider. Document Revised: 03/03/2018 Document Reviewed: 03/03/2018 Elsevier Patient Education  2020 [employer]  

## 2019-07-12 ENCOUNTER — Other Ambulatory Visit: Payer: Self-pay | Admitting: Legal Medicine

## 2019-07-12 ENCOUNTER — Telehealth: Payer: Self-pay

## 2019-07-12 DIAGNOSIS — R110 Nausea: Secondary | ICD-10-CM

## 2019-07-12 MED ORDER — ONDANSETRON HCL 4 MG PO TABS: 4.0000 mg | ORAL_TABLET | Freq: Three times a day (TID) | ORAL | 2 refills | Status: DC | PRN

## 2019-07-12 NOTE — Telephone Encounter (Signed)
Call to patient, let him know x ray showed no fractures but did show the joint was unstable and he needs to see a dentist. Patient asked if Dr. Marina Goodell could prescribe something for nausea. He has nausea when he takes pain medicine

## 2019-07-20 ENCOUNTER — Encounter: Payer: Self-pay | Admitting: Legal Medicine

## 2019-07-20 ENCOUNTER — Other Ambulatory Visit: Payer: Self-pay

## 2019-07-20 ENCOUNTER — Ambulatory Visit (INDEPENDENT_AMBULATORY_CARE_PROVIDER_SITE_OTHER): Payer: Medicare Other | Admitting: Legal Medicine

## 2019-07-20 VITALS — BP 136/70 | HR 57 | Temp 98.4°F | Resp 16 | Ht 65.0 in | Wt 196.0 lb

## 2019-07-20 DIAGNOSIS — I25118 Atherosclerotic heart disease of native coronary artery with other forms of angina pectoris: Secondary | ICD-10-CM

## 2019-07-20 DIAGNOSIS — I10 Essential (primary) hypertension: Secondary | ICD-10-CM

## 2019-07-20 DIAGNOSIS — E088 Diabetes mellitus due to underlying condition with unspecified complications: Secondary | ICD-10-CM | POA: Diagnosis not present

## 2019-07-20 DIAGNOSIS — E559 Vitamin D deficiency, unspecified: Secondary | ICD-10-CM | POA: Diagnosis not present

## 2019-07-20 DIAGNOSIS — E1142 Type 2 diabetes mellitus with diabetic polyneuropathy: Secondary | ICD-10-CM

## 2019-07-20 DIAGNOSIS — E785 Hyperlipidemia, unspecified: Secondary | ICD-10-CM

## 2019-07-20 MED ORDER — SITAGLIPTIN PHOSPHATE 100 MG PO TABS: 100.0000 mg | ORAL_TABLET | Freq: Every day | ORAL | 2 refills | Status: DC

## 2019-07-20 MED ORDER — GABAPENTIN 600 MG PO TABS: 600.0000 mg | ORAL_TABLET | Freq: Three times a day (TID) | ORAL | 2 refills | Status: DC

## 2019-07-20 MED ORDER — METOPROLOL SUCCINATE ER 25 MG PO TB24: 25.0000 mg | ORAL_TABLET | Freq: Every day | ORAL | 2 refills | Status: DC

## 2019-07-20 MED ORDER — METHOCARBAMOL 500 MG PO TABS: 500.0000 mg | ORAL_TABLET | Freq: Four times a day (QID) | ORAL | 1 refills | Status: DC | PRN

## 2019-07-20 MED ORDER — FENOFIBRATE 145 MG PO TABS: 145.0000 mg | ORAL_TABLET | Freq: Every day | ORAL | 2 refills | Status: DC

## 2019-07-20 MED ORDER — ACCU-CHEK GUIDE VI STRP: ORAL_STRIP | 12 refills | Status: DC

## 2019-07-20 MED ORDER — ATORVASTATIN CALCIUM 40 MG PO TABS: 40.0000 mg | ORAL_TABLET | Freq: Every day | ORAL | 2 refills | Status: DC

## 2019-07-20 MED ORDER — NITROGLYCERIN 0.4 MG SL SUBL: 0.4000 mg | SUBLINGUAL_TABLET | SUBLINGUAL | 6 refills | Status: DC | PRN

## 2019-07-20 MED ORDER — VITAMIN D (ERGOCALCIFEROL) 1.25 MG (50000 UNIT) PO CAPS: 50000.0000 [IU] | ORAL_CAPSULE | ORAL | 2 refills | Status: DC

## 2019-07-20 MED ORDER — METFORMIN HCL 1000 MG PO TABS: 1000.0000 mg | ORAL_TABLET | Freq: Two times a day (BID) | ORAL | 2 refills | Status: DC

## 2019-07-20 MED ORDER — INSULIN NPH ISOPHANE & REGULAR (70-30) 100 UNIT/ML ~~LOC~~ SUSP: 30.0000 [IU] | Freq: Two times a day (BID) | SUBCUTANEOUS | 6 refills | Status: DC

## 2019-07-20 MED ORDER — GLIPIZIDE ER 10 MG PO TB24: 10.0000 mg | ORAL_TABLET | Freq: Every day | ORAL | 2 refills | Status: DC

## 2019-07-20 NOTE — Assessment & Plan Note (Signed)
AN INDIVIDUAL CARE PLAN was established and reinforced today.  The patient's status was assessed using clinical findings on exam, lab and other diagnostic tests. The patient's disease status was assessed based on evidence-based guidelines and found to be good controlled. MEDICATIONS were reviewed. SELF MANAGEMENT GOALS have been discussed and patient's success at attaining the goal of low cholesterol was assessed. RECOMMENDATION given include regular exercise 3 days a week and low cholesterol/low fat diet. CLINICAL SUMMARY including written plan to identify barriers unique to the patient due to social or economic  reasons was discussed. 

## 2019-07-20 NOTE — Assessment & Plan Note (Signed)

## 2019-07-20 NOTE — Assessment & Plan Note (Signed)
AN INDIVIDUAL CARE PLAN was established and reinforced today.  The patient's status was assessed using clinical findings on exam, labs, and other diagnostic testing. Patient's success at meeting treatment goals based on disease specific evidence-bassed guidelines and found to be in good control. RECOMMENDATIONS include maintaining present medicines and treatment. 

## 2019-07-20 NOTE — Progress Notes (Signed)
Established Patient Office Visit  Subjective:  Patient ID: Mitchell Hammond, male    DOB: April 13, 1956  Age: 64 y.o. MRN: [id number]  CC:  Chief Complaint  Patient presents with  . Diabetes  . Hyperlipidemia  . Coronary Artery Disease    HPI Mitchell Hammond presents for chronic visit.  Patient present with type 2 diabetes.  Specifically, this is type 2, insulin dependent requiring diabetes, complicated by polyneuropathy.  Compliance with treatment has been good; patient take medicines as directed, maintains diet and exercise regimen, follows up as directed, and is keeping glucose diary.  Date of  diagnosis 2009.  Depression screen has been performed.Tobacco screen nonsmoker. Current medicines for diabetes metformin, novolog mix 70/30.  Patient is on nothing for renal protection and atorvastatin and fenofibrate for cholesterol control.  Patient performs foot exams daily and last ophthalmologic exam was one year.  Patient presents with hyperlipidemia.  Compliance with treatment has been good; patient takes medicines as directed, maintains low cholesterol diet, follows up as directed, and maintains exercise regimen.  Patient is using atorvastatin without problems.  CORONARY ARTERY DISEASE  Patient presents in follow up of CAD. Patient was diagnosed in 2008. The patient has no associated CHF. The patient is currently taking a beta blocker, statin, and aspirin. CAD was diagnosed 12 years years ago.  Patient is having n0 angina. Patient has used no NTG.  Patient is followed by cardiology.  Patient had stent . Last angiography was 2008, last echocardiogram unknown.  Past Medical History:  Diagnosis Date  . CAD (coronary artery disease)   . Chronic back pain   . Discitis   . Hypertension   . 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 MI (myocardial infarction) ([place])    x 2, Multi-link Vision 3.5 x 15 mm BMS in 2009 (mid LAD), balloon 2015  . Vitamin D insufficiency     Past  Surgical History:  Procedure Laterality Date  . BACK SURGERY     2014  . CARDIAC CATHETERIZATION     x 2, 2009 & 2015  . COLONOSCOPY    . CORONARY ANGIOPLASTY     stent - 2008, balloon - 2015  . FASCIOTOMY Right 12/19/2014   Procedure: ANTERIOR COMPARTMENTAL FASCIOTOMY;  Surgeon: Altamese Fairview, MD;  Location: [place];  Service: Orthopedics;  Laterality: Right;  . HAND SURGERY Left   . HARDWARE REMOVAL Right 07/24/2015   Procedure: HARDWARE REMOVAL RIGHT TIBIA;  Surgeon: Altamese Milton, MD;  Location: [place];  Service: Orthopedics;  Laterality: Right;  . KNEE ARTHROSCOPY Right 07/24/2015   Procedure: RIGHT ARTHROSCOPY KNEE WITH PARTIAL SYNOVECTOMY AND MEDIAL MENISCECTOMY;  Surgeon: Altamese Navajo, MD;  Location: [place];  Service: Orthopedics;  Laterality: Right;  . ORIF TIBIA PLATEAU Right 12/19/2014   Procedure: OPEN REDUCTION INTERNAL FIXATION (ORIF) RIGHT TIBIAL PLATEAU;  Surgeon: Altamese [place]edar Hills, MD;  Location: [place];  Service: Orthopedics;  Laterality: Right;  . TONSILLECTOMY    . VASECTOMY      Family History  Problem Relation Age of Onset  . Diabetes Mother   . CAD Mother   . Liver disease Father     Social History   Socioeconomic History  . Marital status: Married    Spouse name: Not on file  . Number of children: 2  . Years of education: Not on file  . Highest education level: Not on file  Occupational History  . Occupation: disabled  Tobacco Use  . Smoking status: Former  Smoker    Packs/day: 1.00    Types: Cigarettes    Quit date: 02/20/2007    Years since quitting: 12.4  . Smokeless tobacco: Never Used  Substance and Sexual Activity  . Alcohol use: No    Alcohol/week: 0.0 standard drinks    Comment: None in years  . Drug use: No  . Sexual activity: Yes    Partners: Female  Other Topics Concern  . Not on file  Social History Narrative   Pt lives with wife and daughter in [place].    Social Determinants of Health   Financial Resource Strain:   . Difficulty of  Paying Living Expenses: Not on file  Food Insecurity:   . Worried About Charity fundraiser in the Last Year: Not on file  . Ran Out of Food in the Last Year: Not on file  Transportation Needs:   . Lack of Transportation (Medical): Not on file  . Lack of Transportation (Non-Medical): Not on file  Physical Activity:   . Days of Exercise per Week: Not on file  . Minutes of Exercise per Session: Not on file  Stress:   . Feeling of Stress : Not on file  Social Connections:   . Frequency of Communication with Friends and Family: Not on file  . Frequency of Social Gatherings with Friends and Family: Not on file  . Attends Religious Services: Not on file  . Active Member of Clubs or Organizations: Not on file  . Attends Archivist Meetings: Not on file  . Marital Status: Not on file  Intimate Partner Violence:   . Fear of Current or Ex-Partner: Not on file  . Emotionally Abused: Not on file  . Physically Abused: Not on file  . Sexually Abused: Not on file    Outpatient Medications Prior to Visit  Medication Sig Dispense Refill  . amoxicillin (AMOXIL) 500 MG capsule Take 500 mg by mouth 3 (three) times daily.    Marland Kitchen aspirin EC 81 MG tablet Take 81 mg by mouth daily.    . Blood Glucose Monitoring Suppl (ACCU-CHEK GUIDE) w/Device KIT USE TO CHECK BLOOD SUGAR 3 TIMES DAILY    . HYDROcodone-acetaminophen (NORCO/VICODIN) 5-325 MG tablet Take 1 tablet by mouth every 6 (six) hours as needed for moderate pain. 30 tablet 0  . ondansetron (ZOFRAN) 4 MG tablet Take 1 tablet (4 mg total) by mouth every 8 (eight) hours as needed. 20 tablet 2  . vitamin C (VITAMIN C) 500 MG tablet Take 1 tablet (500 mg total) by mouth 2 (two) times daily. 60 tablet 2  . atorvastatin (LIPITOR) 40 MG tablet Take 40 mg by mouth daily at 6 PM.     . fenofibrate (TRICOR) 145 MG tablet take 1 tablet by mouth once daily TO LOWER TRIGLYCERIDES  0  . gabapentin (NEURONTIN) 600 MG tablet Take 600 mg by mouth 2 (two)  times daily.   0  . glipiZIDE (GLUCOTROL XL) 10 MG 24 hr tablet Take 10 mg by mouth daily.  0  . insulin NPH-regular Human (70-30) 100 UNIT/ML injection Inject 30 Units into the skin in the morning and at bedtime.    . metFORMIN (GLUCOPHAGE) 1000 MG tablet Take 1,000 mg by mouth 2 (two) times daily with a meal.    . methocarbamol (ROBAXIN) 500 MG tablet Take 1-2 tablets (500-1,000 mg total) by mouth every 6 (six) hours as needed for muscle spasms. 90 tablet 1  . metoprolol succinate (TOPROL-XL) 25 MG 24  hr tablet Take 25 mg by mouth daily.    . nitroGLYCERIN (NITROSTAT) 0.4 MG SL tablet Place 0.4 mg under the tongue every 5 (five) minutes as needed for chest pain.    Marland Kitchen NOVOLOG MIX 70/30 FLEXPEN (70-30) 100 UNIT/ML FlexPen inject 30 units subcutaneously twice a day  0  . Vitamin D, Ergocalciferol, (DRISDOL) 50000 UNITS CAPS capsule Take 1 capsule (50,000 Units total) by mouth every 7 (seven) days. 12 capsule 0  . pravastatin (PRAVACHOL) 80 MG tablet Take 80 mg by mouth daily.    . sitaGLIPtin (JANUVIA) 100 MG tablet      No facility-administered medications prior to visit.    No Known Allergies  ROS Review of Systems  Constitutional: Negative.   HENT: Negative.   Eyes: Negative.   Respiratory: Negative.   Cardiovascular: Negative.   Gastrointestinal: Negative.   Endocrine: Negative.   Genitourinary: Negative.   Musculoskeletal: Negative.   Skin: Negative.   Allergic/Immunologic: Negative.   Hematological: Negative.   Psychiatric/Behavioral: Negative.       Objective:    Physical Exam  Constitutional: He is oriented to person, place, and time. He appears well-developed and well-nourished.  HENT:  Head: Normocephalic and atraumatic.  Eyes: Pupils are equal, round, and reactive to light. Conjunctivae and EOM are normal.  Cardiovascular: Normal rate, regular rhythm and normal heart sounds.  Pulmonary/Chest: Effort normal and breath sounds normal.  Abdominal: Soft. Bowel sounds  are normal.  Musculoskeletal:     Cervical back: Normal range of motion and neck supple.     Comments: Arthritis knees  Neurological: He is alert and oriented to person, place, and time. He has normal reflexes. A sensory deficit is present.  numbness both feet , bunions present  Skin: Skin is warm and dry.  Psychiatric: He has a normal mood and affect.  Vitals reviewed.   BP 136/70 (BP Location: Right Arm, Patient Position: Sitting)   Pulse (!) 57   Temp 98.4 F (36.9 C) (Oral)   Resp 16   Ht 5' 5"  (1.651 m)   Wt 196 lb (88.9 kg)   SpO2 98%   BMI 32.62 kg/m  Wt Readings from Last 3 Encounters:  07/20/19 196 lb (88.9 kg)  07/05/19 196 lb (88.9 kg)  07/24/15 185 lb (83.9 kg)     Health Maintenance Due  Topic Date Due  . Hepatitis C Screening  [birth year]  . OPHTHALMOLOGY EXAM  01/25/1964  . URINE MICROALBUMIN  01/25/1964  . HIV Screening  01/24/1969  . TETANUS/TDAP  01/24/1973  . COLONOSCOPY  01/25/2004  . HEMOGLOBIN A1C  06/20/2015  . INFLUENZA VACCINE  12/18/2018  . PNA vac Low Risk Adult (1 of 2 - PCV13) 01/25/2019    There are no preventive care reminders to display for this patient.  Lab Results  Component Value Date   TSH 2.829 12/18/2014   Lab Results  Component Value Date   WBC 7.0 07/24/2015   HGB 15.5 07/24/2015   HCT 44.8 07/24/2015   MCV 83.3 07/24/2015   PLT 169 07/24/2015   Lab Results  Component Value Date   NA 141 07/24/2015   K 3.7 07/24/2015   CO2 25 07/24/2015   GLUCOSE 138 (H) 07/24/2015   BUN 9 07/24/2015   CREATININE 0.91 07/24/2015   BILITOT 1.0 12/18/2014   ALKPHOS 81 12/18/2014   AST 25 12/18/2014   ALT 29 12/18/2014   PROT 6.2 (L) 12/18/2014   ALBUMIN 3.5 12/18/2014   CALCIUM 9.3 07/24/2015  ANIONGAP 12 07/24/2015   No results found for: CHOL No results found for: HDL No results found for: LDLCALC No results found for: TRIG No results found for: CHOLHDL Lab Results  Component Value Date   HGBA1C 7.6 (H) 12/18/2014        Assessment & Plan:   Problem List Items Addressed This Visit      Cardiovascular and Mediastinum   CAD (coronary artery disease) - Primary    An individual plan was formulated based on patient history and exam, labs and evidence based data. Patient has not had recent angina or nitroglycerin use. continue present treatment.      Relevant Medications   atorvastatin (LIPITOR) 40 MG tablet   fenofibrate (TRICOR) 145 MG tablet   metoprolol succinate (TOPROL-XL) 25 MG 24 hr tablet   nitroGLYCERIN (NITROSTAT) 0.4 MG SL tablet   Other Relevant Orders   Lipid Panel   Essential hypertension    An individual care plan was established and reinforced today.  The patient's status was assessed using clinical findings on exam and labs or diagnostic tests. The patient's success at meeting treatment goals on disease specific evidence-based guidelines and found to be well controlled. SELF MANAGEMENT: The patient and I together assessed ways to personally work towards obtaining the recommended goals. RECOMMENDATIONS: avoid decongestants found in common cold remedies, decrease consumption of alcohol, perform routine monitoring of BP with home BP cuff, exercise, reduction of dietary salt, take medicines as prescribed, try not to miss doses and quit smoking.  Regular exercise and maintaining a healthy weight is needed.  Stress reduction may help. A CLINICAL SUMMARY including written plan identify barriers to care unique to individual due to social or financial issues.  We attempt to mutually creat solutions for individual and family understanding.      Relevant Medications   atorvastatin (LIPITOR) 40 MG tablet   fenofibrate (TRICOR) 145 MG tablet   metoprolol succinate (TOPROL-XL) 25 MG 24 hr tablet   nitroGLYCERIN (NITROSTAT) 0.4 MG SL tablet   Other Relevant Orders   CBC with Differential   Comprehensive metabolic panel     Endocrine   Diabetic polyneuropathy associated with type 2 diabetes  mellitus ([place])    An individual care plan was established and reinforced today.  The patient's status was assessed using clinical findings on exam, labs and diagnostic testing. Patient success at meeting goals based on disease specific evidence-based guidelines and found to be good controlled. Medications were assessed and patient's understanding of the medical issues , including barriers were assessed. Recommend adherence to a diabetic diet, a graduated exercise program, HgbA1c level is checked quarterly, and urine microalbumin performed yearly .  Annual mono-filament sensation testing performed. Lower blood pressure and control hyperlipidemia is important. Get annual eye exams and annual flu shots and smoking cessation discussed.  Self management goals were discussed.      Relevant Medications   atorvastatin (LIPITOR) 40 MG tablet   gabapentin (NEURONTIN) 600 MG tablet   glipiZIDE (GLUCOTROL XL) 10 MG 24 hr tablet   metFORMIN (GLUCOPHAGE) 1000 MG tablet   methocarbamol (ROBAXIN) 500 MG tablet   sitaGLIPtin (JANUVIA) 100 MG tablet   insulin NPH-regular Human (70-30) 100 UNIT/ML injection   Other Relevant Orders   For Home Use Only DME Diabetic Shoe   Diabetes mellitus due to underlying condition with unspecified complications [facility])    An individual care plan was established and reinforced today.  The patient's status was assessed using clinical findings on exam, labs  and diagnostic testing. Patient success at meeting goals based on disease specific evidence-based guidelines and found to be good controlled. Medications were assessed and patient's understanding of the medical issues , including barriers were assessed. Recommend adherence to a diabetic diet, a graduated exercise program, HgbA1c level is checked quarterly, and urine microalbumin performed yearly .  Annual mono-filament sensation testing performed. Lower blood pressure and control hyperlipidemia is important. Get annual eye exams and  annual flu shots and smoking cessation discussed.  Self management goals were discussed.      Relevant Medications   atorvastatin (LIPITOR) 40 MG tablet   gabapentin (NEURONTIN) 600 MG tablet   glipiZIDE (GLUCOTROL XL) 10 MG 24 hr tablet   metFORMIN (GLUCOPHAGE) 1000 MG tablet   sitaGLIPtin (JANUVIA) 100 MG tablet   insulin NPH-regular Human (70-30) 100 UNIT/ML injection   glucose blood (ACCU-CHEK GUIDE) test strip   Other Relevant Orders   Hemoglobin A1c     Other   Vitamin D insufficiency    AN INDIVIDUAL CARE PLAN was established and reinforced today.  The patient's status was assessed using clinical findings on exam, labs, and other diagnostic testing. Patient's success at meeting treatment goals based on disease specific evidence-bassed guidelines and found to be in good control. RECOMMENDATIONS include maintaining present medicines and treatment.      Relevant Medications   Vitamin D, Ergocalciferol, (DRISDOL) 1.25 MG (50000 UNIT) CAPS capsule   Dyslipidemia    AN INDIVIDUAL CARE PLAN was established and reinforced today.  The patient's status was assessed using clinical findings on exam, lab and other diagnostic tests. The patient's disease status was assessed based on evidence-based guidelines and found to be good controlled. MEDICATIONS were reviewed. SELF MANAGEMENT GOALS have been discussed and patient's success at attaining the goal of low cholesterol was assessed. RECOMMENDATION given include regular exercise 3 days a week and low cholesterol/low fat diet. CLINICAL SUMMARY including written plan to identify barriers unique to the patient due to social or economic  reasons was discussed.      Relevant Medications   atorvastatin (LIPITOR) 40 MG tablet   fenofibrate (TRICOR) 145 MG tablet      Meds ordered this encounter  Medications  . atorvastatin (LIPITOR) 40 MG tablet    Sig: Take 1 tablet (40 mg total) by mouth daily at 6 PM.    Dispense:  90 tablet    Refill:   2  . fenofibrate (TRICOR) 145 MG tablet    Sig: Take 1 tablet (145 mg total) by mouth daily.    Dispense:  90 tablet    Refill:  2  . gabapentin (NEURONTIN) 600 MG tablet    Sig: Take 1 tablet (600 mg total) by mouth 3 (three) times daily.    Dispense:  270 tablet    Refill:  2  . glipiZIDE (GLUCOTROL XL) 10 MG 24 hr tablet    Sig: Take 1 tablet (10 mg total) by mouth daily.    Dispense:  90 tablet    Refill:  2  . metFORMIN (GLUCOPHAGE) 1000 MG tablet    Sig: Take 1 tablet (1,000 mg total) by mouth 2 (two) times daily with a meal.    Dispense:  90 tablet    Refill:  2  . metoprolol succinate (TOPROL-XL) 25 MG 24 hr tablet    Sig: Take 1 tablet (25 mg total) by mouth daily.    Dispense:  90 tablet    Refill:  2  . methocarbamol (ROBAXIN) 500  MG tablet    Sig: Take 1-2 tablets (500-1,000 mg total) by mouth every 6 (six) hours as needed for muscle spasms.    Dispense:  90 tablet    Refill:  1  . nitroGLYCERIN (NITROSTAT) 0.4 MG SL tablet    Sig: Place 1 tablet (0.4 mg total) under the tongue every 5 (five) minutes as needed for chest pain.    Dispense:  25 tablet    Refill:  6  . sitaGLIPtin (JANUVIA) 100 MG tablet    Sig: Take 1 tablet (100 mg total) by mouth daily.    Dispense:  90 tablet    Refill:  2  . Vitamin D, Ergocalciferol, (DRISDOL) 1.25 MG (50000 UNIT) CAPS capsule    Sig: Take 1 capsule (50,000 Units total) by mouth every 7 (seven) days.    Dispense:  12 capsule    Refill:  2  . insulin NPH-regular Human (70-30) 100 UNIT/ML injection    Sig: Inject 30 Units into the skin in the morning and at bedtime.    Dispense:  15 mL    Refill:  6  . glucose blood (ACCU-CHEK GUIDE) test strip    Sig: Use as instructed    Dispense:  100 each    Refill:  12  RX for diabetic shoes, insulin changed to humilin  Follow-up: Return in about 4 months (around 11/19/2019) for fasting.    Reinaldo Meeker, MD

## 2019-07-20 NOTE — Assessment & Plan Note (Signed)

## 2019-07-20 NOTE — Assessment & Plan Note (Signed)
An individual plan was formulated based on patient history and exam, labs and evidence based data. Patient has not had recent angina or nitroglycerin use. continue present treatment. 

## 2019-07-21 ENCOUNTER — Other Ambulatory Visit: Payer: Self-pay

## 2019-07-21 DIAGNOSIS — E088 Diabetes mellitus due to underlying condition with unspecified complications: Secondary | ICD-10-CM

## 2019-07-21 LAB — CBC WITH DIFFERENTIAL/PLATELET
Basophils Absolute: 0.1 10*3/uL (ref 0.0–0.2)
Basos: 1 %
EOS (ABSOLUTE): 0.1 10*3/uL (ref 0.0–0.4)
Eos: 3 %
Hematocrit: 43.5 % (ref 37.5–51.0)
Hemoglobin: 14.4 g/dL (ref 13.0–17.7)
Immature Grans (Abs): 0 10*3/uL (ref 0.0–0.1)
Immature Granulocytes: 0 %
Lymphocytes Absolute: 1.1 10*3/uL (ref 0.7–3.1)
Lymphs: 21 %
MCH: 28.7 pg (ref 26.6–33.0)
MCHC: 33.1 g/dL (ref 31.5–35.7)
MCV: 87 fL (ref 79–97)
Monocytes Absolute: 0.4 10*3/uL (ref 0.1–0.9)
Monocytes: 9 %
Neutrophils Absolute: 3.3 10*3/uL (ref 1.4–7.0)
Neutrophils: 66 %
Platelets: 169 10*3/uL (ref 150–450)
RBC: 5.02 x10E6/uL (ref 4.14–5.80)
RDW: 13 % (ref 11.6–15.4)
WBC: 5 10*3/uL (ref 3.4–10.8)

## 2019-07-21 LAB — COMPREHENSIVE METABOLIC PANEL
ALT: 37 IU/L (ref 0–44)
AST: 28 IU/L (ref 0–40)
Albumin/Globulin Ratio: 2.1 (ref 1.2–2.2)
Albumin: 4.2 g/dL (ref 3.8–4.8)
Alkaline Phosphatase: 82 IU/L (ref 39–117)
BUN/Creatinine Ratio: 18 (ref 10–24)
BUN: 18 mg/dL (ref 8–27)
Bilirubin Total: 0.4 mg/dL (ref 0.0–1.2)
CO2: 17 mmol/L — ABNORMAL LOW (ref 20–29)
Calcium: 9.2 mg/dL (ref 8.6–10.2)
Chloride: 109 mmol/L — ABNORMAL HIGH (ref 96–106)
Creatinine, Ser: 0.99 mg/dL (ref 0.76–1.27)
GFR calc Af Amer: 92 mL/min/{1.73_m2} (ref >59)
GFR calc non Af Amer: 80 mL/min/{1.73_m2} (ref >59)
Globulin, Total: 2 g/dL (ref 1.5–4.5)
Glucose: 155 mg/dL — ABNORMAL HIGH (ref 65–99)
Potassium: 4.3 mmol/L (ref 3.5–5.2)
Sodium: 144 mmol/L (ref 134–144)
Total Protein: 6.2 g/dL (ref 6.0–8.5)

## 2019-07-21 LAB — LIPID PANEL
Chol/HDL Ratio: 3.8 ratio (ref 0.0–5.0)
Cholesterol, Total: 140 mg/dL (ref 100–199)
HDL: 37 mg/dL — ABNORMAL LOW (ref >39)
LDL Chol Calc (NIH): 87 mg/dL (ref 0–99)
Triglycerides: 84 mg/dL (ref 0–149)
VLDL Cholesterol Cal: 16 mg/dL (ref 5–40)

## 2019-07-21 LAB — HEMOGLOBIN A1C
Est. average glucose Bld gHb Est-mCnc: 246 mg/dL
Hgb A1c MFr Bld: 10.2 % — ABNORMAL HIGH (ref 4.8–5.6)

## 2019-07-21 LAB — CARDIOVASCULAR RISK ASSESSMENT

## 2019-07-21 MED ORDER — INSULIN NPH ISOPHANE & REGULAR (70-30) 100 UNIT/ML ~~LOC~~ SUSP: 40.0000 [IU] | Freq: Two times a day (BID) | SUBCUTANEOUS | 6 refills | Status: DC

## 2019-07-21 NOTE — Progress Notes (Signed)
LVM to call us back

## 2019-07-21 NOTE — Progress Notes (Signed)
CBC all normal, glucose 155, kidney and liver tests normal, Cholesterol OK, A1c 10.2 very high, increase insulin to 40units bid, watch diabetic diet. lp

## 2019-07-22 ENCOUNTER — Other Ambulatory Visit: Payer: Self-pay

## 2019-07-28 ENCOUNTER — Encounter: Payer: Self-pay | Admitting: Legal Medicine

## 2019-08-09 DIAGNOSIS — H25812 Combined forms of age-related cataract, left eye: Secondary | ICD-10-CM | POA: Diagnosis not present

## 2019-08-09 DIAGNOSIS — Z01818 Encounter for other preprocedural examination: Secondary | ICD-10-CM | POA: Diagnosis not present

## 2019-08-09 DIAGNOSIS — H2512 Age-related nuclear cataract, left eye: Secondary | ICD-10-CM | POA: Diagnosis not present

## 2019-08-09 DIAGNOSIS — E119 Type 2 diabetes mellitus without complications: Secondary | ICD-10-CM | POA: Diagnosis not present

## 2019-08-09 DIAGNOSIS — H25813 Combined forms of age-related cataract, bilateral: Secondary | ICD-10-CM | POA: Diagnosis not present

## 2019-08-11 ENCOUNTER — Other Ambulatory Visit: Payer: Self-pay

## 2019-08-11 DIAGNOSIS — I10 Essential (primary) hypertension: Secondary | ICD-10-CM

## 2019-08-11 MED ORDER — METOPROLOL SUCCINATE ER 25 MG PO TB24: 25.0000 mg | ORAL_TABLET | Freq: Every day | ORAL | 2 refills | Status: DC

## 2019-08-18 DIAGNOSIS — H25811 Combined forms of age-related cataract, right eye: Secondary | ICD-10-CM | POA: Diagnosis not present

## 2019-08-18 DIAGNOSIS — H2511 Age-related nuclear cataract, right eye: Secondary | ICD-10-CM | POA: Diagnosis not present

## 2019-10-03 ENCOUNTER — Other Ambulatory Visit: Payer: Self-pay

## 2019-10-03 DIAGNOSIS — E088 Diabetes mellitus due to underlying condition with unspecified complications: Secondary | ICD-10-CM

## 2019-10-03 MED ORDER — ACCU-CHEK FASTCLIX LANCETS MISC: 1.0000 | Freq: Every day | 2 refills | Status: DC

## 2019-10-03 MED ORDER — ACCU-CHEK GUIDE VI STRP: ORAL_STRIP | 12 refills | Status: DC

## 2019-10-04 ENCOUNTER — Other Ambulatory Visit: Payer: Self-pay

## 2019-10-04 DIAGNOSIS — E088 Diabetes mellitus due to underlying condition with unspecified complications: Secondary | ICD-10-CM

## 2019-10-04 MED ORDER — ACCU-CHEK GUIDE VI STRP: 1.0000 | ORAL_STRIP | Freq: Two times a day (BID) | 12 refills | Status: DC

## 2019-10-04 MED ORDER — INSULIN NPH ISOPHANE & REGULAR (70-30) 100 UNIT/ML ~~LOC~~ SUSP: 40.0000 [IU] | Freq: Two times a day (BID) | SUBCUTANEOUS | 6 refills | Status: DC

## 2019-10-14 ENCOUNTER — Other Ambulatory Visit: Payer: Self-pay

## 2019-12-22 ENCOUNTER — Other Ambulatory Visit: Payer: Self-pay

## 2019-12-22 DIAGNOSIS — E088 Diabetes mellitus due to underlying condition with unspecified complications: Secondary | ICD-10-CM

## 2019-12-22 MED ORDER — INSULIN NPH ISOPHANE & REGULAR (70-30) 100 UNIT/ML ~~LOC~~ SUSP: 40.0000 [IU] | Freq: Two times a day (BID) | SUBCUTANEOUS | 6 refills | Status: DC

## 2019-12-27 ENCOUNTER — Other Ambulatory Visit: Payer: Self-pay | Admitting: Legal Medicine

## 2020-01-10 ENCOUNTER — Other Ambulatory Visit: Payer: Self-pay

## 2020-01-10 DIAGNOSIS — E1142 Type 2 diabetes mellitus with diabetic polyneuropathy: Secondary | ICD-10-CM

## 2020-01-10 MED ORDER — ACCU-CHEK GUIDE W/DEVICE KIT: 1.0000 | PACK | Freq: Once | 0 refills | Status: AC

## 2020-01-18 ENCOUNTER — Other Ambulatory Visit: Payer: Self-pay

## 2020-01-18 DIAGNOSIS — E1142 Type 2 diabetes mellitus with diabetic polyneuropathy: Secondary | ICD-10-CM

## 2020-01-18 DIAGNOSIS — E088 Diabetes mellitus due to underlying condition with unspecified complications: Secondary | ICD-10-CM

## 2020-01-18 MED ORDER — ONETOUCH ULTRASOFT LANCETS MISC: 12 refills | Status: DC

## 2020-01-18 MED ORDER — ONETOUCH VERIO VI STRP: ORAL_STRIP | 12 refills | Status: DC

## 2020-01-18 MED ORDER — ONETOUCH VERIO W/DEVICE KIT
1.0000 | PACK | Freq: Once | 0 refills | Status: AC
Start: 2020-01-18 — End: 2020-01-18

## 2020-02-16 ENCOUNTER — Other Ambulatory Visit: Payer: Self-pay | Admitting: Legal Medicine

## 2020-02-16 DIAGNOSIS — E785 Hyperlipidemia, unspecified: Secondary | ICD-10-CM

## 2020-04-09 DIAGNOSIS — M5442 Lumbago with sciatica, left side: Secondary | ICD-10-CM | POA: Diagnosis not present

## 2020-04-09 DIAGNOSIS — M25561 Pain in right knee: Secondary | ICD-10-CM | POA: Diagnosis not present

## 2020-04-23 DIAGNOSIS — R058 Other specified cough: Secondary | ICD-10-CM | POA: Diagnosis not present

## 2020-04-23 DIAGNOSIS — R0981 Nasal congestion: Secondary | ICD-10-CM | POA: Diagnosis not present

## 2020-04-23 DIAGNOSIS — Z20822 Contact with and (suspected) exposure to covid-19: Secondary | ICD-10-CM | POA: Diagnosis not present

## 2020-05-02 ENCOUNTER — Other Ambulatory Visit: Payer: Self-pay | Admitting: Legal Medicine

## 2020-05-02 DIAGNOSIS — E088 Diabetes mellitus due to underlying condition with unspecified complications: Secondary | ICD-10-CM

## 2020-05-07 DIAGNOSIS — J069 Acute upper respiratory infection, unspecified: Secondary | ICD-10-CM | POA: Diagnosis not present

## 2020-05-07 DIAGNOSIS — R059 Cough, unspecified: Secondary | ICD-10-CM | POA: Diagnosis not present

## 2020-06-13 ENCOUNTER — Other Ambulatory Visit: Payer: Self-pay

## 2020-06-13 DIAGNOSIS — E088 Diabetes mellitus due to underlying condition with unspecified complications: Secondary | ICD-10-CM

## 2020-06-13 DIAGNOSIS — E1142 Type 2 diabetes mellitus with diabetic polyneuropathy: Secondary | ICD-10-CM

## 2020-06-13 MED ORDER — ONETOUCH VERIO VI STRP: ORAL_STRIP | 0 refills | Status: DC

## 2020-06-14 ENCOUNTER — Encounter: Payer: Self-pay | Admitting: Legal Medicine

## 2020-06-14 ENCOUNTER — Other Ambulatory Visit: Payer: Self-pay

## 2020-06-14 ENCOUNTER — Ambulatory Visit (INDEPENDENT_AMBULATORY_CARE_PROVIDER_SITE_OTHER): Payer: Medicare Other | Admitting: Legal Medicine

## 2020-06-14 VITALS — BP 136/70 | HR 68 | Temp 97.5°F | Resp 16 | Ht 65.0 in | Wt 196.0 lb

## 2020-06-14 DIAGNOSIS — Z23 Encounter for immunization: Secondary | ICD-10-CM

## 2020-06-14 DIAGNOSIS — E782 Mixed hyperlipidemia: Secondary | ICD-10-CM

## 2020-06-14 DIAGNOSIS — I25118 Atherosclerotic heart disease of native coronary artery with other forms of angina pectoris: Secondary | ICD-10-CM | POA: Diagnosis not present

## 2020-06-14 DIAGNOSIS — E088 Diabetes mellitus due to underlying condition with unspecified complications: Secondary | ICD-10-CM

## 2020-06-14 DIAGNOSIS — G4733 Obstructive sleep apnea (adult) (pediatric): Secondary | ICD-10-CM

## 2020-06-14 DIAGNOSIS — I10 Essential (primary) hypertension: Secondary | ICD-10-CM

## 2020-06-14 DIAGNOSIS — E1142 Type 2 diabetes mellitus with diabetic polyneuropathy: Secondary | ICD-10-CM | POA: Diagnosis not present

## 2020-06-14 DIAGNOSIS — L739 Follicular disorder, unspecified: Secondary | ICD-10-CM

## 2020-06-14 HISTORY — DX: Mixed hyperlipidemia: E78.2

## 2020-06-14 LAB — POCT UA - MICROALBUMIN: Microalbumin Ur, POC: 80 mg/L

## 2020-06-14 MED ORDER — GABAPENTIN 600 MG PO TABS: 600.0000 mg | ORAL_TABLET | Freq: Four times a day (QID) | ORAL | 2 refills | Status: DC

## 2020-06-14 MED ORDER — ONETOUCH VERIO VI STRP
ORAL_STRIP | 6 refills | Status: DC
Start: 2020-06-14 — End: 2020-06-26

## 2020-06-14 MED ORDER — MUPIROCIN CALCIUM 2 % EX CREA
1.0000 | TOPICAL_CREAM | Freq: Two times a day (BID) | CUTANEOUS | 0 refills | Status: DC
Start: 2020-06-14 — End: 2020-08-29

## 2020-06-14 NOTE — Progress Notes (Signed)
Subjective:  Patient ID: Mitchell Hammond, male    DOB: 1955-05-24  Age: 65 y.o. MRN: [id number]  Chief Complaint  Patient presents with  . Coronary Artery Disease  . Diabetes    HPI: chronic visit  Patient present with type 2 diabetes.  Specifically, this is type 2, insulin requiring diabetes, complicated by polyneuropathy.  Compliance with treatment has been good; patient take medicines as directed, maintains diet and exercise regimen, follows up as directed, and is keeping glucose diary.  Date of  diagnosis 2010.  Depression screen has been performed.Tobacco screen nonsmoker. Current medicines for diabetes insulin NPH 70/30 40 units bid, metformin, glpizide. .  Patient is on none for renal protection and atorvastatin for cholesterol control.  Patient performs foot exams daily and last ophthalmologic exam was yes.  CORONARY ARTERY DISEASE  Patient presents in follow up of CAD. Patient was diagnosed in 2008. The patient has no associated CHF. The patient is currently taking a beta blocker, statin, and aspirin. CAD was diagnosed 14 years ago.  Patient is having no angina. Patient has used some NTG.  Patient is followed by cardiology.  Patient had 2 stents . Last angiography was 2008, last echocardiogram unknown  He has some folliculitis on arms..   Current Outpatient Medications on File Prior to Visit  Medication Sig Dispense Refill  . aspirin EC 81 MG tablet Take 81 mg by mouth daily.    Marland Kitchen atorvastatin (LIPITOR) 40 MG tablet Take 1 tablet (40 mg total) by mouth daily at 6 PM. 90 tablet 2  . fenofibrate (TRICOR) 145 MG tablet TAKE 1 TABLET(145 MG) BY MOUTH DAILY 90 tablet 2  . glipiZIDE (GLUCOTROL XL) 10 MG 24 hr tablet TAKE 1 TABLET(10 MG) BY MOUTH DAILY 90 tablet 2  . insulin NPH-regular Human (70-30) 100 UNIT/ML injection Inject 40 Units into the skin in the morning and at bedtime. 21 mL 6  . Lancets (ONETOUCH ULTRASOFT) lancets Use as instructed 100 each 12  . metFORMIN (GLUCOPHAGE) 1000  MG tablet Take 1 tablet (1,000 mg total) by mouth 2 (two) times daily with a meal. 90 tablet 2  . methocarbamol (ROBAXIN) 500 MG tablet Take 1-2 tablets (500-1,000 mg total) by mouth every 6 (six) hours as needed for muscle spasms. 90 tablet 1  . metoprolol succinate (TOPROL-XL) 25 MG 24 hr tablet Take 1 tablet (25 mg total) by mouth daily. 90 tablet 2  . nitroGLYCERIN (NITROSTAT) 0.4 MG SL tablet Place 1 tablet (0.4 mg total) under the tongue every 5 (five) minutes as needed for chest pain. 25 tablet 6  . vitamin C (VITAMIN C) 500 MG tablet Take 1 tablet (500 mg total) by mouth 2 (two) times daily. 60 tablet 2  . Vitamin D, Ergocalciferol, (DRISDOL) 1.25 MG (50000 UNIT) CAPS capsule Take 1 capsule (50,000 Units total) by mouth every 7 (seven) days. 12 capsule 2  . Blood Glucose Monitoring Suppl ([place]) w/Device KIT TEST BLOOD GLUCOSE ONCE DAILY     No current facility-administered medications on file prior to visit.   Past Medical History:  Diagnosis Date  . CAD (coronary artery disease)   . Chronic back pain   . Discitis   . Hypertension   . 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 MI (myocardial infarction) ([place])    x 2, Multi-link Vision 3.5 x 15 mm BMS in 2009 (mid LAD), balloon 2015  . Vitamin D insufficiency    Past Surgical History:  Procedure Laterality Date  . BACK SURGERY     2014  . CARDIAC CATHETERIZATION     x 2, 2009 & 2015  . COLONOSCOPY    . CORONARY ANGIOPLASTY     stent - 2008, balloon - 2015  . FASCIOTOMY Right 12/19/2014   Procedure: ANTERIOR COMPARTMENTAL FASCIOTOMY;  Surgeon: Altamese Brock Hall, MD;  Location: [facility];  Service: Orthopedics;  Laterality: Right;  . HAND SURGERY Left   . HARDWARE REMOVAL Right 07/24/2015   Procedure: HARDWARE REMOVAL RIGHT TIBIA;  Surgeon: Altamese Bellingham, MD;  Location: [facility];  Service: Orthopedics;  Laterality: Right;  . KNEE ARTHROSCOPY Right 07/24/2015   Procedure: RIGHT ARTHROSCOPY KNEE WITH PARTIAL  SYNOVECTOMY AND MEDIAL MENISCECTOMY;  Surgeon: Altamese Shelby, MD;  Location: [facility];  Service: Orthopedics;  Laterality: Right;  . ORIF TIBIA PLATEAU Right 12/19/2014   Procedure: OPEN REDUCTION INTERNAL FIXATION (ORIF) RIGHT TIBIAL PLATEAU;  Surgeon: Altamese Makaha, MD;  Location: [facility];  Service: Orthopedics;  Laterality: Right;  . TONSILLECTOMY    . VASECTOMY      Family History  Problem Relation Age of Onset  . Diabetes Mother   . CAD Mother   . Liver disease Father    Social History   Socioeconomic History  . Marital status: Married    Spouse name: Not on file  . Number of children: 2  . Years of education: Not on file  . Highest education level: Not on file  Occupational History  . Occupation: disabled  Tobacco Use  . Smoking status: Former Smoker    Packs/day: 1.00    Types: Cigarettes    Quit date: 02/20/2007    Years since quitting: 13.3  . Smokeless tobacco: Never Used  Vaping Use  . Vaping Use: Never used  Substance and Sexual Activity  . Alcohol use: No    Alcohol/week: 0.0 standard drinks    Comment: None in years  . Drug use: No  . Sexual activity: Yes    Partners: Female  Other Topics Concern  . Not on file  Social History Narrative   Pt lives with wife and daughter in [place].    Social Determinants of Health   Financial Resource Strain: Not on file  Food Insecurity: Not on file  Transportation Needs: Not on file  Physical Activity: Not on file  Stress: Not on file  Social Connections: Not on file    Review of Systems  Constitutional: Negative for activity change, appetite change and diaphoresis.  HENT: Negative for congestion and sinus pain.   Respiratory: Negative for cough, chest tightness and shortness of breath.   Cardiovascular: Positive for chest pain. Negative for palpitations and leg swelling.  Gastrointestinal: Negative for abdominal distention and abdominal pain.  Endocrine: Negative for polyuria.  Genitourinary: Negative for  difficulty urinating and flank pain.       Noctria x 2  Musculoskeletal: Negative for arthralgias and back pain.  Skin: Negative.   Neurological: Negative.   Psychiatric/Behavioral: Negative.      Objective:  BP 136/70   Pulse 68   Temp (!) 97.5 F (36.4 C)   Resp 16   Ht 5' 5"  (1.651 m)   Wt 196 lb (88.9 kg)   BMI 32.62 kg/m   BP/Weight 06/14/2020 07/20/2019 08/01/1759  Systolic BP 607 371 062  Diastolic BP 70 70 82  Wt. (Lbs) 196 196 196  BMI 32.62 32.62 33.67    Physical Exam Vitals reviewed.  Constitutional:      General:  He is not in acute distress.    Appearance: Normal appearance. He is obese.  HENT:     Right Ear: Tympanic membrane normal.     Left Ear: Tympanic membrane normal.     Nose: Nose normal.     Mouth/Throat:     Mouth: Mucous membranes are moist.  Eyes:     Extraocular Movements: Extraocular movements intact.     Conjunctiva/sclera: Conjunctivae normal.     Pupils: Pupils are equal, round, and reactive to light.  Cardiovascular:     Rate and Rhythm: Normal rate and regular rhythm.     Pulses: Normal pulses.     Heart sounds: No murmur heard. No gallop.   Pulmonary:     Effort: Pulmonary effort is normal. No respiratory distress.     Breath sounds: Normal breath sounds. No wheezing.  Abdominal:     General: Abdomen is flat. There is no distension.     Palpations: Abdomen is soft.     Tenderness: There is no abdominal tenderness.  Musculoskeletal:     Cervical back: Normal range of motion and neck supple.  Skin:    General: Skin is warm.     Capillary Refill: Capillary refill takes less than 2 seconds.     Comments: Folliculitis  forarms  Neurological:     General: No focal deficit present.     Mental Status: He is alert and oriented to person, place, and time.     Sensory: Sensory deficit present.  Psychiatric:        Mood and Affect: Mood normal.        Thought Content: Thought content normal.        Judgment: Judgment normal.      Diabetic Foot Exam - Simple   Simple Foot Form Diabetic Foot exam was performed with the following findings: Yes 06/14/2020  8:15 AM  Visual Inspection No deformities, no ulcerations, no other skin breakdown bilaterally: Yes Sensation Testing See comments: Yes Pulse Check Posterior Tibialis and Dorsalis pulse intact bilaterally: Yes Comments Decreased sensation feet      Lab Results  Component Value Date   WBC 5.0 07/20/2019   HGB 14.4 07/20/2019   HCT 43.5 07/20/2019   PLT 169 07/20/2019   GLUCOSE 155 (H) 07/20/2019   CHOL 140 07/20/2019   TRIG 84 07/20/2019   HDL 37 (L) 07/20/2019   LDLCALC 87 07/20/2019   ALT 37 07/20/2019   AST 28 07/20/2019   NA 144 07/20/2019   K 4.3 07/20/2019   CL 109 (H) 07/20/2019   CREATININE 0.99 07/20/2019   BUN 18 07/20/2019   CO2 17 (L) 07/20/2019   TSH 2.829 12/18/2014   INR 1.20 12/18/2014   HGBA1C 10.2 (H) 07/20/2019   MICROALBUR 80 06/14/2020      Assessment & Plan:   1. Diabetic polyneuropathy associated with type 2 diabetes mellitus (HCC) - Hemoglobin A1c - glucose blood (ONETOUCH VERIO) test strip; Check blood sugar three times daily  Dispense: 200 each; Refill: 6 - POCT UA - Microalbumin An individual care plan for diabetes was established and reinforced today.  The patient's status was assessed using clinical findings on exam, labs and diagnostic testing. Patient success at meeting goals based on disease specific evidence-based guidelines and found to be good controlled. Medications were assessed and patient's understanding of the medical issues , including barriers were assessed. Recommend adherence to a diabetic diet, a graduated exercise program, HgbA1c level is checked quarterly, and urine microalbumin performed yearly .  Annual mono-filament sensation testing performed. Lower blood pressure and control hyperlipidemia is important. Get annual eye exams and annual flu shots and smoking cessation discussed.  Self  management goals were discussed.  2. Coronary artery disease of native artery of native heart with stable angina pectoris [facility]) - Ambulatory referral to Cardiology Patient's CAD was assessed using history and physical along with other information to maximize treatment.  Evidence based criteria was use in deciding proper management for this disease process.  Patient's CAD is under good control.therapy continue present medicines.  3. Essential hypertension - CBC with Differential/Platelet - Comprehensive metabolic panel An individual hypertension care plan was established and reinforced today.  The patient's status was assessed using clinical findings on exam and labs or diagnostic tests. The patient's success at meeting treatment goals on disease specific evidence-based guidelines and found to be well controlled. SELF MANAGEMENT: The patient and I together assessed ways to personally work towards obtaining the recommended goals. RECOMMENDATIONS: avoid decongestants found in common cold remedies, decrease consumption of alcohol, perform routine monitoring of BP with home BP cuff, exercise, reduction of dietary salt, take medicines as prescribed, try not to miss doses and quit smoking.  Regular exercise and maintaining a healthy weight is needed.  Stress reduction may help. A CLINICAL SUMMARY including written plan identify barriers to care unique to individual due to social or financial issues.  We attempt to mutually creat solutions for individual and family understanding.  4. Mixed hyperlipidemia - Lipid panel AN INDIVIDUAL CARE PLAN for hyperlipidemia/ cholesterol was established and reinforced today.  The patient's status was assessed using clinical findings on exam, lab and other diagnostic tests. The patient's disease status was assessed based on evidence-based guidelines and found to be well controlled. MEDICATIONS were reviewed. SELF MANAGEMENT GOALS have been discussed and patient's success  at attaining the goal of low cholesterol was assessed. RECOMMENDATION given include regular exercise 3 days a week and low cholesterol/low fat diet. CLINICAL SUMMARY including written plan to identify barriers unique to the patient due to social or economic  reasons was discussed.  5. Diabetes mellitus due to underlying condition with unspecified complications (HCC) - gabapentin (NEURONTIN) 600 MG tablet; Take 1 tablet (600 mg total) by mouth in the morning, at noon, in the evening, and at bedtime.  Dispense: 360 tablet; Refill: 2 - glucose blood (ONETOUCH VERIO) test strip; Check blood sugar three times daily  Dispense: 200 each; Refill: 6 Diet Recommendations for Diabetes   Starchy foods include: Bread, rice, pasta, potatoes, corn, crackers, bagels, muffins, all baked goods.   Protein foods include: Meat, fish, poultry, eggs, dairy foods, and beans such as pinto and kidney beans (beans also provide carbohydrate).   1. Eat at least 3 meals and 1-2 snacks per day. Never go more than 4-5 hours without         eating.  2. Limit starchy foods to TWO per meal and ONE per snack. ONE portion of a starchy         food is equal to the following:   - ONE slice of bread (or its equivalent, such as half of a hamburger bun).   - 1/2 cup of a "scoopable" starchy food such as potatoes or rice.   - 1 OUNCE of starchy snack foods such as crackers or pretzels (look on label).   - 15 grams of carbohydrate as shown on food label.  3. Both lunch and dinner should include a protein food, a carb food, and vegetables.   -  Obtain twice as many veg's as protein or carbohydrate foods for both lunch and                dinner.   - Try to keep frozen veg's on hand for a quick vegetable serving.      6. Diabetic polyneuropathy associated with type 2 diabetes mellitus (HCC) - Hemoglobin A1c - glucose blood (ONETOUCH VERIO) test strip; Check blood sugar three times daily  Dispense: 200 each; Refill: 6 - POCT UA -  Microalbumin An individual care plan for diabetes was established and reinforced today.  The patient's status was assessed using clinical findings on exam, labs and diagnostic testing. Patient success at meeting goals based on disease specific evidence-based guidelines and found to be fair controlled. He has polyneuropathy Medications were assessed and patient's understanding of the medical issues , including barriers were assessed. Recommend adherence to a diabetic diet, a graduated exercise program, HgbA1c level is checked quarterly, and urine microalbumin performed yearly .  Annual mono-filament sensation testing performed. Lower blood pressure and control hyperlipidemia is important. Get annual eye exams and annual flu shots and smoking cessation discussed.  Self management goals were discussed.  7. Folliculitis - mupirocin cream (BACTROBAN) 2 %; Apply 1 application topically 2 (two) times daily.  Dispense: 15 g; Refill: 0 Patient has folliculitis on forearms and legs, start on bactroban bid  8. OSA (obstructive sleep apnea) - Home sleep test Patient has signs and symptoms for OSA and has severe somnolence in daytime  9. Need for prophylactic vaccination against Streptococcus pneumoniae (pneumococcus) - Pneumococcal polysaccharide vaccine 23-valent greater than or equal to 2yo subcutaneous/IM  10. Need for immunization against influenza - Flu Vaccine QUAD High Dose(Fluad)    Meds ordered this encounter  Medications  . gabapentin (NEURONTIN) 600 MG tablet    Sig: Take 1 tablet (600 mg total) by mouth in the morning, at noon, in the evening, and at bedtime.    Dispense:  360 tablet    Refill:  2  . glucose blood (ONETOUCH VERIO) test strip    Sig: Check blood sugar three times daily    Dispense:  200 each    Refill:  6  . mupirocin cream (BACTROBAN) 2 %    Sig: Apply 1 application topically 2 (two) times daily.    Dispense:  15 g    Refill:  0    Orders Placed This Encounter   Procedures  . Pneumococcal polysaccharide vaccine 23-valent greater than or equal to 2yo subcutaneous/IM  . Flu Vaccine QUAD High Dose(Fluad)  . CBC with Differential/Platelet  . Comprehensive metabolic panel  . Hemoglobin A1c  . Lipid panel  . Ambulatory referral to Cardiology  . POCT UA - Microalbumin  . Home sleep test      I spent 35 minutes dedicated to the care of this patient on the date of this encounter to include face-to-face time with the patient, as well as: review of old records  Follow-up: Return in about 3 months (around 09/12/2020) for fasting.  An After Visit Summary was printed and given to the patient.  Reinaldo Meeker, MD [facility] [phone]

## 2020-06-15 LAB — CBC WITH DIFFERENTIAL/PLATELET
Basophils Absolute: 0.1 10*3/uL (ref 0.0–0.2)
Basos: 1 %
EOS (ABSOLUTE): 0.2 10*3/uL (ref 0.0–0.4)
Eos: 3 %
Hematocrit: 45.9 % (ref 37.5–51.0)
Hemoglobin: 15.6 g/dL (ref 13.0–17.7)
Immature Grans (Abs): 0 10*3/uL (ref 0.0–0.1)
Immature Granulocytes: 0 %
Lymphocytes Absolute: 1.1 10*3/uL (ref 0.7–3.1)
Lymphs: 15 %
MCH: 28.3 pg (ref 26.6–33.0)
MCHC: 34 g/dL (ref 31.5–35.7)
MCV: 83 fL (ref 79–97)
Monocytes Absolute: 0.5 10*3/uL (ref 0.1–0.9)
Monocytes: 7 %
Neutrophils Absolute: 5.1 10*3/uL (ref 1.4–7.0)
Neutrophils: 74 %
Platelets: 190 10*3/uL (ref 150–450)
RBC: 5.52 x10E6/uL (ref 4.14–5.80)
RDW: 12.8 % (ref 11.6–15.4)
WBC: 7 10*3/uL (ref 3.4–10.8)

## 2020-06-15 LAB — COMPREHENSIVE METABOLIC PANEL
ALT: 97 IU/L — ABNORMAL HIGH (ref 0–44)
AST: 61 IU/L — ABNORMAL HIGH (ref 0–40)
Albumin/Globulin Ratio: 2 (ref 1.2–2.2)
Albumin: 4.5 g/dL (ref 3.8–4.8)
Alkaline Phosphatase: 87 IU/L (ref 44–121)
BUN/Creatinine Ratio: 19 (ref 10–24)
BUN: 20 mg/dL (ref 8–27)
Bilirubin Total: 0.7 mg/dL (ref 0.0–1.2)
CO2: 21 mmol/L (ref 20–29)
Calcium: 9.6 mg/dL (ref 8.6–10.2)
Chloride: 103 mmol/L (ref 96–106)
Creatinine, Ser: 1.07 mg/dL (ref 0.76–1.27)
GFR calc Af Amer: 83 mL/min/{1.73_m2} (ref >59)
GFR calc non Af Amer: 72 mL/min/{1.73_m2} (ref >59)
Globulin, Total: 2.2 g/dL (ref 1.5–4.5)
Glucose: 173 mg/dL — ABNORMAL HIGH (ref 65–99)
Potassium: 4.7 mmol/L (ref 3.5–5.2)
Sodium: 140 mmol/L (ref 134–144)
Total Protein: 6.7 g/dL (ref 6.0–8.5)

## 2020-06-15 LAB — LIPID PANEL
Chol/HDL Ratio: 4 ratio (ref 0.0–5.0)
Cholesterol, Total: 151 mg/dL (ref 100–199)
HDL: 38 mg/dL — ABNORMAL LOW (ref >39)
LDL Chol Calc (NIH): 96 mg/dL (ref 0–99)
Triglycerides: 92 mg/dL (ref 0–149)
VLDL Cholesterol Cal: 17 mg/dL (ref 5–40)

## 2020-06-15 LAB — HEMOGLOBIN A1C
Est. average glucose Bld gHb Est-mCnc: 232 mg/dL
Hgb A1c MFr Bld: 9.7 % — ABNORMAL HIGH (ref 4.8–5.6)

## 2020-06-15 LAB — CARDIOVASCULAR RISK ASSESSMENT

## 2020-06-15 NOTE — Progress Notes (Signed)
CBC normal, glucose 173, kidney tests normal, liver tests elevated- we will follow, A1c 9.7, stop glipizide, nurse visit to start ozempic or trulicity, Cholesterol good,  lp

## 2020-06-20 ENCOUNTER — Other Ambulatory Visit: Payer: Self-pay

## 2020-06-20 ENCOUNTER — Telehealth: Payer: Self-pay

## 2020-06-20 ENCOUNTER — Other Ambulatory Visit: Payer: Self-pay | Admitting: Legal Medicine

## 2020-06-20 ENCOUNTER — Ambulatory Visit: Payer: Medicare Other

## 2020-06-20 MED ORDER — ZOLPIDEM TARTRATE 5 MG PO TABS: 5.0000 mg | ORAL_TABLET | Freq: Every evening | ORAL | 1 refills | Status: DC | PRN

## 2020-06-20 NOTE — Telephone Encounter (Signed)
Patient came today for ozempic and he said that he has problems to sleep. He used to take [place] before. He asked if can you send a prescription?  Please advice.

## 2020-06-22 DIAGNOSIS — I219 Acute myocardial infarction, unspecified: Secondary | ICD-10-CM | POA: Insufficient documentation

## 2020-06-22 DIAGNOSIS — I10 Essential (primary) hypertension: Secondary | ICD-10-CM | POA: Insufficient documentation

## 2020-06-22 DIAGNOSIS — M4640 Discitis, unspecified, site unspecified: Secondary | ICD-10-CM | POA: Insufficient documentation

## 2020-06-26 ENCOUNTER — Ambulatory Visit (INDEPENDENT_AMBULATORY_CARE_PROVIDER_SITE_OTHER): Payer: Medicare Other | Admitting: Cardiology

## 2020-06-26 ENCOUNTER — Other Ambulatory Visit: Payer: Self-pay

## 2020-06-26 ENCOUNTER — Encounter: Payer: Self-pay | Admitting: Cardiology

## 2020-06-26 VITALS — BP 124/70 | HR 74 | Ht 65.0 in | Wt 196.2 lb

## 2020-06-26 DIAGNOSIS — E088 Diabetes mellitus due to underlying condition with unspecified complications: Secondary | ICD-10-CM

## 2020-06-26 DIAGNOSIS — E782 Mixed hyperlipidemia: Secondary | ICD-10-CM

## 2020-06-26 DIAGNOSIS — I209 Angina pectoris, unspecified: Secondary | ICD-10-CM

## 2020-06-26 DIAGNOSIS — I10 Essential (primary) hypertension: Secondary | ICD-10-CM | POA: Diagnosis not present

## 2020-06-26 DIAGNOSIS — E669 Obesity, unspecified: Secondary | ICD-10-CM

## 2020-06-26 DIAGNOSIS — E66811 Obesity, class 1: Secondary | ICD-10-CM | POA: Insufficient documentation

## 2020-06-26 DIAGNOSIS — I2510 Atherosclerotic heart disease of native coronary artery without angina pectoris: Secondary | ICD-10-CM

## 2020-06-26 HISTORY — DX: Obesity, unspecified: E66.9

## 2020-06-26 HISTORY — DX: Obesity, class 1: E66.811

## 2020-06-26 NOTE — H&P (View-Only) (Signed)
Cardiology Office Note: Mitchell Hammond   Date:  06/26/2020   ID:  Merville Hijazi, DOB 10-13-1955, MRN [id number]  PCP:  Abigail Miyamoto, MD  Cardiologist:  Garwin Brothers, MD   Referring MD: Abigail Miyamoto,*    ASSESSMENT:    1. Coronary artery disease involving native coronary artery of native heart without angina pectoris   2. Essential hypertension   3. Mixed hyperlipidemia   4. Diabetes mellitus due to underlying condition with unspecified complications (HCC)   5. Angina pectoris (HCC)   6. Obesity (BMI 30.0-34.9)    PLAN:    In order of problems listed above:  1. Angina pectoris: Coronary artery disease: Patient symptoms are very concerning and I discussed this with him at length.  Following recommendations were made.  Sublingual nitroglycerin prescription was sent, its protocol and 911 protocol explained and the patient vocalized understanding questions were answered to the patient's satisfaction.In view of the patient's symptoms, I discussed with the patient options for evaluation. Invasive and noninvasive options were given to the patient. I discussed stress testing and coronary angiography and left heart catheterization at length. Benefits, pros and cons of each approach were discussed at length. Patient had multiple questions which were answered to the patient's satisfaction. Patient opted for invasive evaluation and we will set up for coronary angiography and left heart catheterization. Further recommendations will be made based on the findings with coronary angiography. In the interim if the patient has any significant symptoms in hospital to the nearest emergency room. 2. Essential hypertension: Blood pressure stable and diet was emphasized. 3. Mixed dyslipidemia: Managed by primary care physician.  He is on statin therapy and LFTs are elevated.  We will review this after coronary angiography.  I would also like for this issue to be addressed by the patient's primary care  provider. 4. Diabetes mellitus and obesity: Hemoglobin A1c is elevated.  Patient is overweight and has abdominal obesity.  Please explained to the patient and he promises to do better.  Will be seen follow-up appointment in a couple of weeks after the coronary angiography and the appointment will be given to him by our interventional colleagues.  Patient has multiple questions which were answered to satisfaction.   Medication Adjustments/Labs and Tests Ordered: Current medicines are reviewed at length with the patient today.  Concerns regarding medicines are outlined above.  Orders Placed This Encounter  Procedures  . EKG 12-Lead   No orders of the defined types were placed in this encounter.    History of Present Illness:    Mitchell Hammond is a 65 y.o. male who is being seen today for the evaluation of angina pectoris and coronary artery disease at the request of Abigail Miyamoto,*.  Patient is a pleasant 65 year old male.  He has known coronary artery disease and underwent coronary intervention in 2008.  I do not have those details.  This patient has been under my care in my previous practice.  He is here now to transfer his care and be established with my current practice.  Patient leads a sedentary lifestyle.  This is because of orthopedic issues involving his knee.  He has history of hypertension dyslipidemia diabetes mellitus and obesity.  He mentions to me that when he tries to exert himself for which he is under stress he has substernal chest tightness going to the neck.  He is used nitroglycerin with relief.  There are times when he has to use 2 nitroglycerin.  At the time of  my evaluation, the patient is alert awake oriented and in no distress.  Past Medical History:  Diagnosis Date  . Angina pectoris (HCC) 12/26/2015  . CAD (coronary artery disease)   . Chip fracture of triquetral bone of right wrist, closed, initial encounter 12/09/2016  . Chronic back pain   . 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 Diabetes mellitus due to underlying condition with unspecified complications (HCC) 12/26/2015  . Diabetic polyneuropathy associated with type 2 diabetes mellitus (HCC) 08/20/2016  . Discitis   . Essential hypertension 12/26/2015  . Fracture, tibial plateau 12/18/2014  . Hypertension   . 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 Metatarsalgia of both feet 08/20/2016  . MI (myocardial infarction) (HCC)    x 2, Multi-link Vision 3.5 x 15 mm BMS in 2009 (mid LAD), balloon 2015  . Mixed hyperlipidemia 06/14/2020  . Tibial plateau fracture 12/18/2014  . Vitamin D insufficiency     Past Surgical History:  Procedure Laterality Date  . BACK SURGERY     2014  . CARDIAC CATHETERIZATION     x 2, 2009 & 2015  . COLONOSCOPY    . CORONARY ANGIOPLASTY     stent - 2008, balloon - 2015  . FASCIOTOMY Right 12/19/2014   Procedure: ANTERIOR COMPARTMENTAL FASCIOTOMY;  Surgeon: Myrene Galas, MD;  Location: [facility] OR;  Service: Orthopedics;  Laterality: Right;  . HAND SURGERY Left   . HARDWARE REMOVAL Right 07/24/2015   Procedure: HARDWARE REMOVAL RIGHT TIBIA;  Surgeon: Myrene Galas, MD;  Location: [facility] OR;  Service: Orthopedics;  Laterality: Right;  . KNEE ARTHROSCOPY Right 07/24/2015   Procedure: RIGHT ARTHROSCOPY KNEE WITH PARTIAL SYNOVECTOMY AND MEDIAL MENISCECTOMY;  Surgeon: Myrene Galas, MD;  Location: [facility] OR;  Service: Orthopedics;  Laterality: Right;  . ORIF TIBIA PLATEAU Right 12/19/2014   Procedure: OPEN REDUCTION INTERNAL FIXATION (ORIF) RIGHT TIBIAL PLATEAU;  Surgeon: Myrene Galas, MD;  Location: [facility] OR;  Service: Orthopedics;  Laterality: Right;  . TONSILLECTOMY    . VASECTOMY      Current Medications: Current Meds  Medication Sig  . aspirin EC 81 MG tablet Take 81 mg by mouth daily.  Marland Kitchen atorvastatin (LIPITOR) 40 MG tablet Take 1 tablet (40 mg total) by mouth daily at 6 PM.  . fenofibrate (TRICOR) 145 MG tablet TAKE  1 TABLET(145 MG) BY MOUTH DAILY  . gabapentin (NEURONTIN) 600 MG tablet Take 1 tablet (600 mg total) by mouth in the morning, at noon, in the evening, and at bedtime.  . insulin NPH-regular Human (70-30) 100 UNIT/ML injection Inject 40 Units into the skin in the morning and at bedtime.  . metFORMIN (GLUCOPHAGE) 1000 MG tablet Take 1 tablet (1,000 mg total) by mouth 2 (two) times daily with a meal.  . methocarbamol (ROBAXIN) 500 MG tablet Take 1-2 tablets (500-1,000 mg total) by mouth every 6 (six) hours as needed for muscle spasms.  . metoprolol succinate (TOPROL-XL) 25 MG 24 hr tablet Take 1 tablet (25 mg total) by mouth daily.  . mupirocin cream (BACTROBAN) 2 % Apply 1 application topically 2 (two) times daily.  . nitroGLYCERIN (NITROSTAT) 0.4 MG SL tablet Place 1 tablet (0.4 mg total) under the tongue every 5 (five) minutes as needed for chest pain.  . vitamin C (VITAMIN C) 500 MG tablet Take 1 tablet (500 mg total) by mouth 2 (two) times daily.  . Vitamin D, Ergocalciferol, (DRISDOL) 1.25  MG (50000 UNIT) CAPS capsule Take 1 capsule (50,000 Units total) by mouth every 7 (seven) days.  Marland Kitchen zolpidem (AMBIEN) 5 MG tablet Take 1 tablet (5 mg total) by mouth at bedtime as needed for sleep.     Allergies:   Patient has no known allergies.   Social History   Socioeconomic History  . Marital status: Married    Spouse name: Not on file  . Number of children: 2  . Years of education: Not on file  . Highest education level: Not on file  Occupational History  . Occupation: disabled  Tobacco Use  . Smoking status: Former Smoker    Packs/day: 1.00    Types: Cigarettes    Quit date: 02/20/2007    Years since quitting: 13.3  . Smokeless tobacco: Never Used  Vaping Use  . Vaping Use: Never used  Substance and Sexual Activity  . Alcohol use: No    Alcohol/week: 0.0 standard drinks    Comment: None in years  . Drug use: No  . Sexual activity: Yes    Partners: Female  Other Topics Concern  .  Not on file  Social History Narrative   Pt lives with wife and daughter in [place].    Social Determinants of Health   Financial Resource Strain: Not on file  Food Insecurity: Not on file  Transportation Needs: Not on file  Physical Activity: Not on file  Stress: Not on file  Social Connections: Not on file     Family History: The patient's family history includes CAD in his mother; Diabetes in his mother; Liver disease in his father.  ROS:   Please see the history of present illness.    All other systems reviewed and are negative.  EKGs/Labs/Other Studies Reviewed:    The following studies were reviewed today: EKG reveals sinus rhythm and nonspecific ST-T changes.   Recent Labs: 06/14/2020: ALT 97; BUN 20; Creatinine, Ser 1.07; Hemoglobin 15.6; Platelets 190; Potassium 4.7; Sodium 140  Recent Lipid Panel    Component Value Date/Time   CHOL 151 06/14/2020 0835   TRIG 92 06/14/2020 0835   HDL 38 (L) 06/14/2020 0835   CHOLHDL 4.0 06/14/2020 0835   LDLCALC 96 06/14/2020 0835    Physical Exam:    VS:  BP 124/70   Pulse 74   Ht 5\' 5"  (1.651 m)   Wt 196 lb 3.2 oz (89 kg)   SpO2 95%   BMI 32.65 kg/m     Wt Readings from Last 3 Encounters:  06/26/20 196 lb 3.2 oz (89 kg)  06/14/20 196 lb (88.9 kg)  07/20/19 196 lb (88.9 kg)     GEN: Patient is in no acute distress HEENT: Normal NECK: No JVD; No carotid bruits LYMPHATICS: No lymphadenopathy CARDIAC: S1 S2 regular, 2/6 systolic murmur at the apex. RESPIRATORY:  Clear to auscultation without rales, wheezing or rhonchi  ABDOMEN: Soft, non-tender, non-distended MUSCULOSKELETAL:  No edema; No deformity  SKIN: Warm and dry NEUROLOGIC:  Alert and oriented x 3 PSYCHIATRIC:  Normal affect    Signed, 09/19/19, MD  06/26/2020 4:15 PM    [facility]

## 2020-06-26 NOTE — Patient Instructions (Signed)
Medication Instructions:  No medication changes. *If you need a refill on your cardiac medications before your next appointment, please call your pharmacy*   Lab Work: None ordered If you have labs (blood work) drawn today and your tests are completely normal, you will receive your results only by: Marland Kitchen MyChart Message (if you have MyChart) OR . A paper copy in the mail If you have any lab test that is abnormal or we need to change your treatment, we will call you to review the results.   Testing/Procedures:    [facility] [place] [address] [place] [address] Dept: [phone] Loc: [phone]  Bodee Lafoe  06/26/2020  You are scheduled for a Cardiac Catheterization on Monday, February 14 with Dr. Nanetta Batty.  1. Please arrive at the [facility] (Main Entrance A) at [facility]: [address] [place], [place] [address] at 9:30 AM (This time is two hours before your procedure to ensure your preparation). Free valet parking service is available.   Special note: Every effort is made to have your procedure done on time. Please understand that emergencies sometimes delay scheduled procedures.  2. Diet: Do not eat solid foods after midnight.  The patient may have clear liquids until 5am upon the day of the procedure.  3. Labs: You had your labs today in the office.  4. Medication instructions in preparation for your procedure:   Contrast Allergy: No  Take only 20 units of insulin the night before your procedure. Do not take any insulin on the day of the procedure.  Do not take Diabetes Med Glucophage (Metformin) on the day of the procedure and HOLD 48 HOURS AFTER THE PROCEDURE.  On the morning of your procedure, take your Aspirin and any morning medicines NOT listed above.  You may use sips of water.  5. Plan for one night stay--bring personal belongings. 6. Bring a current list of your  medications and current insurance cards. 7. You MUST have a responsible person to drive you home. 8. Someone MUST be with you the first 24 hours after you arrive home or your discharge will be delayed. 9. Please wear clothes that are easy to get on and off and wear slip-on shoes.  Thank you for allowing [place] to care for you!   -- [facility]    Follow-Up: At [facility], you and your health needs are our priority.  As part of our continuing mission to provide you with exceptional heart care, we have created designated Provider Care Teams.  These Care Teams include your primary Cardiologist (physician) and Advanced Practice Providers (APPs -  Physician Assistants and Nurse Practitioners) who all work together to provide you with the care you need, when you need it.  We recommend signing up for the patient portal called "MyChart".  Sign up information is provided on this After Visit Summary.  MyChart is used to connect with patients for Virtual Visits (Telemedicine).  Patients are able to view lab/test results, encounter notes, upcoming appointments, etc.  Non-urgent messages can be sent to your provider as well.   To learn more about what you can do with MyChart, go to [email or web address].    Your next appointment:   1 month(s)  The format for your next appointment:   In Person  Provider:   Belva Crome, MD   Other Instructions  Coronary Angiogram With Stent Coronary angiogram with stent placement is a procedure  to widen or open a narrow blood vessel of the heart (coronary artery). Arteries may become blocked by cholesterol buildup (plaques) in the lining of the artery wall. When a coronary artery becomes partially blocked, blood flow to that area decreases. This may lead to chest pain or a heart attack (myocardial infarction). A stent is a small piece of metal that looks like mesh or spring. Stent placement may be done as treatment after a heart  attack, or to prevent a heart attack if a blocked artery is found by a coronary angiogram. Let your health care provider know about:  Any allergies you have, including allergies to medicines or contrast dye.  All medicines you are taking, including vitamins, herbs, eye drops, creams, and over-the-counter medicines.  Any problems you or family members have had with anesthetic medicines.  Any blood disorders you have.  Any surgeries you have had.  Any medical conditions you have, including kidney problems or kidney failure.  Whether you are pregnant or may be pregnant.  Whether you are breastfeeding. What are the risks? Generally, this is a safe procedure. However, serious problems may occur, including:  Damage to nearby structures or organs, such as the heart, blood vessels, or kidneys.  A return of blockage.  Bleeding, infection, or bruising at the insertion site.  A collection of blood under the skin (hematoma) at the insertion site.  A blood clot in another part of the body.  Allergic reaction to medicines or dyes.  Bleeding into the abdomen (retroperitoneal bleeding).  Stroke (rare).  Heart attack (rare). What happens before the procedure? Staying hydrated Follow instructions from your health care provider about hydration, which may include:  Up to 2 hours before the procedure - you may continue to drink clear liquids, such as water, clear fruit juice, black coffee, and plain tea.   Eating and drinking restrictions Follow instructions from your health care provider about eating and drinking, which may include:  8 hours before the procedure - stop eating heavy meals or foods, such as meat, fried foods, or fatty foods.  6 hours before the procedure - stop eating light meals or foods, such as toast or cereal.  2 hours before the procedure - stop drinking clear liquids. Medicines Ask your health care provider about:  Changing or stopping your regular medicines.  This is especially important if you are taking diabetes medicines or blood thinners.  Taking medicines such as aspirin and ibuprofen. These medicines can thin your blood. Do not take these medicines unless your health care provider tells you to take them. ? Generally, aspirin is recommended before a thin tube, called a catheter, is passed through a blood vessel and inserted into the heart (cardiac catheterization).  Taking over-the-counter medicines, vitamins, herbs, and supplements. General instructions  Do not use any products that contain nicotine or tobacco for at least 4 weeks before the procedure. These products include cigarettes, e-cigarettes, and chewing tobacco. If you need help quitting, ask your health care provider.  Plan to have someone take you home from the hospital or clinic.  If you will be going home right after the procedure, plan to have someone with you for 24 hours.  You may have tests and imaging procedures.  Ask your health care provider: ? How your insertion site will be marked. Ask which artery will be used for the procedure. ? What steps will be taken to help prevent infection. These may include:  Removing hair at the insertion site.  Washing skin  with a germ-killing soap.  Taking antibiotic medicine. What happens during the procedure?  An IV will be inserted into one of your veins.  Electrodes may be placed on your chest to monitor your heart rate during the procedure.  You will be given one or more of the following: ? A medicine to help you relax (sedative). ? A medicine to numb the area (local anesthetic) for catheter insertion.  A small incision will be made for catheter insertion.  The catheter will be inserted into an artery using a guide wire. The location may be in your groin, your wrist, or the fold of your arm (near your elbow).  An X-ray procedure (fluoroscopy) will be used to help guide the catheter to the opening of the heart  arteries.  A dye will be injected into the catheter. X-rays will be taken. The dye helps to show where any narrowing or blockages are located in the arteries.  Tell your health care provider if you have chest pain or trouble breathing.  A tiny wire will be guided to the blocked spot, and a balloon will be inflated to make the artery wider.  The stent will be expanded to crush the plaques into the wall of the vessel. The stent will hold the area open and improve the blood flow. Most stents have a drug coating to reduce the risk of the stent narrowing over time.  The artery may be made wider using a drill, laser, or other tools that remove plaques.  The catheter will be removed when the blood flow improves. The stent will stay where it was placed, and the lining of the artery will grow over it.  A bandage (dressing) will be placed on the insertion site. Pressure will be applied to stop bleeding.  The IV will be removed. This procedure may vary among health care providers and hospitals.   What happens after the procedure?  Your blood pressure, heart rate, breathing rate, and blood oxygen level will be monitored until you leave the hospital or clinic.  If the procedure is done through the leg, you will lie flat in bed for a few hours or for as long as told by your health care provider. You will be instructed not to bend or cross your legs.  The insertion site and the pulse in your foot or wrist will be checked often.  You may have more blood tests, X-rays, and a test that records the electrical activity of your heart (electrocardiogram, or ECG).  Do not drive for 24 hours if you were given a sedative during your procedure. Summary  Coronary angiogram with stent placement is a procedure to widen or open a narrowed coronary artery. This is done to treat heart problems.  Before the procedure, let your health care provider know about all the medical conditions and surgeries you have or have  had.  This is a safe procedure. However, some problems may occur, including damage to nearby structures or organs, bleeding, blood clots, or allergies.  Follow your health care provider's instructions about eating, drinking, medicines, and other lifestyle changes, such as quitting tobacco use before the procedure. This information is not intended to replace advice given to you by your health care provider. Make sure you discuss any questions you have with your health care provider. Document Revised: 11/24/2018 Document Reviewed: 11/24/2018 Elsevier Patient Education  2021 [employer].

## 2020-06-26 NOTE — Progress Notes (Signed)
Cardiology Office Note: Mitchell Hammond   Date:  06/26/2020   ID:  Merville Hijazi, DOB 10-13-1955, MRN [id number]  PCP:  Abigail Miyamoto, MD  Cardiologist:  Garwin Brothers, MD   Referring MD: Abigail Miyamoto,*    ASSESSMENT:    1. Coronary artery disease involving native coronary artery of native heart without angina pectoris   2. Essential hypertension   3. Mixed hyperlipidemia   4. Diabetes mellitus due to underlying condition with unspecified complications (HCC)   5. Angina pectoris (HCC)   6. Obesity (BMI 30.0-34.9)    PLAN:    In order of problems listed above:  1. Angina pectoris: Coronary artery disease: Patient symptoms are very concerning and I discussed this with him at length.  Following recommendations were made.  Sublingual nitroglycerin prescription was sent, its protocol and 911 protocol explained and the patient vocalized understanding questions were answered to the patient's satisfaction.In view of the patient's symptoms, I discussed with the patient options for evaluation. Invasive and noninvasive options were given to the patient. I discussed stress testing and coronary angiography and left heart catheterization at length. Benefits, pros and cons of each approach were discussed at length. Patient had multiple questions which were answered to the patient's satisfaction. Patient opted for invasive evaluation and we will set up for coronary angiography and left heart catheterization. Further recommendations will be made based on the findings with coronary angiography. In the interim if the patient has any significant symptoms in hospital to the nearest emergency room. 2. Essential hypertension: Blood pressure stable and diet was emphasized. 3. Mixed dyslipidemia: Managed by primary care physician.  He is on statin therapy and LFTs are elevated.  We will review this after coronary angiography.  I would also like for this issue to be addressed by the patient's primary care  provider. 4. Diabetes mellitus and obesity: Hemoglobin A1c is elevated.  Patient is overweight and has abdominal obesity.  Please explained to the patient and he promises to do better.  Will be seen follow-up appointment in a couple of weeks after the coronary angiography and the appointment will be given to him by our interventional colleagues.  Patient has multiple questions which were answered to satisfaction.   Medication Adjustments/Labs and Tests Ordered: Current medicines are reviewed at length with the patient today.  Concerns regarding medicines are outlined above.  Orders Placed This Encounter  Procedures  . EKG 12-Lead   No orders of the defined types were placed in this encounter.    History of Present Illness:    Mitchell Hammond is a 65 y.o. male who is being seen today for the evaluation of angina pectoris and coronary artery disease at the request of Abigail Miyamoto,*.  Patient is a pleasant 65 year old male.  He has known coronary artery disease and underwent coronary intervention in 2008.  I do not have those details.  This patient has been under my care in my previous practice.  He is here now to transfer his care and be established with my current practice.  Patient leads a sedentary lifestyle.  This is because of orthopedic issues involving his knee.  He has history of hypertension dyslipidemia diabetes mellitus and obesity.  He mentions to me that when he tries to exert himself for which he is under stress he has substernal chest tightness going to the neck.  He is used nitroglycerin with relief.  There are times when he has to use 2 nitroglycerin.  At the time of  my evaluation, the patient is alert awake oriented and in no distress.  Past Medical History:  Diagnosis Date  . Angina pectoris (HCC) 12/26/2015  . CAD (coronary artery disease)   . Chip fracture of triquetral bone of right wrist, closed, initial encounter 12/09/2016  . Chronic back pain   . 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 Diabetes mellitus due to underlying condition with unspecified complications (HCC) 12/26/2015  . Diabetic polyneuropathy associated with type 2 diabetes mellitus (HCC) 08/20/2016  . Discitis   . Essential hypertension 12/26/2015  . Fracture, tibial plateau 12/18/2014  . Hypertension   . 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 Metatarsalgia of both feet 08/20/2016  . MI (myocardial infarction) (HCC)    x 2, Multi-link Vision 3.5 x 15 mm BMS in 2009 (mid LAD), balloon 2015  . Mixed hyperlipidemia 06/14/2020  . Tibial plateau fracture 12/18/2014  . Vitamin D insufficiency     Past Surgical History:  Procedure Laterality Date  . BACK SURGERY     2014  . CARDIAC CATHETERIZATION     x 2, 2009 & 2015  . COLONOSCOPY    . CORONARY ANGIOPLASTY     stent - 2008, balloon - 2015  . FASCIOTOMY Right 12/19/2014   Procedure: ANTERIOR COMPARTMENTAL FASCIOTOMY;  Surgeon: Myrene Galas, MD;  Location: [facility] OR;  Service: Orthopedics;  Laterality: Right;  . HAND SURGERY Left   . HARDWARE REMOVAL Right 07/24/2015   Procedure: HARDWARE REMOVAL RIGHT TIBIA;  Surgeon: Myrene Galas, MD;  Location: [facility] OR;  Service: Orthopedics;  Laterality: Right;  . KNEE ARTHROSCOPY Right 07/24/2015   Procedure: RIGHT ARTHROSCOPY KNEE WITH PARTIAL SYNOVECTOMY AND MEDIAL MENISCECTOMY;  Surgeon: Myrene Galas, MD;  Location: [facility] OR;  Service: Orthopedics;  Laterality: Right;  . ORIF TIBIA PLATEAU Right 12/19/2014   Procedure: OPEN REDUCTION INTERNAL FIXATION (ORIF) RIGHT TIBIAL PLATEAU;  Surgeon: Myrene Galas, MD;  Location: [facility] OR;  Service: Orthopedics;  Laterality: Right;  . TONSILLECTOMY    . VASECTOMY      Current Medications: Current Meds  Medication Sig  . aspirin EC 81 MG tablet Take 81 mg by mouth daily.  Marland Kitchen atorvastatin (LIPITOR) 40 MG tablet Take 1 tablet (40 mg total) by mouth daily at 6 PM.  . fenofibrate (TRICOR) 145 MG tablet TAKE  1 TABLET(145 MG) BY MOUTH DAILY  . gabapentin (NEURONTIN) 600 MG tablet Take 1 tablet (600 mg total) by mouth in the morning, at noon, in the evening, and at bedtime.  . insulin NPH-regular Human (70-30) 100 UNIT/ML injection Inject 40 Units into the skin in the morning and at bedtime.  . metFORMIN (GLUCOPHAGE) 1000 MG tablet Take 1 tablet (1,000 mg total) by mouth 2 (two) times daily with a meal.  . methocarbamol (ROBAXIN) 500 MG tablet Take 1-2 tablets (500-1,000 mg total) by mouth every 6 (six) hours as needed for muscle spasms.  . metoprolol succinate (TOPROL-XL) 25 MG 24 hr tablet Take 1 tablet (25 mg total) by mouth daily.  . mupirocin cream (BACTROBAN) 2 % Apply 1 application topically 2 (two) times daily.  . nitroGLYCERIN (NITROSTAT) 0.4 MG SL tablet Place 1 tablet (0.4 mg total) under the tongue every 5 (five) minutes as needed for chest pain.  . vitamin C (VITAMIN C) 500 MG tablet Take 1 tablet (500 mg total) by mouth 2 (two) times daily.  . Vitamin D, Ergocalciferol, (DRISDOL) 1.25  MG (50000 UNIT) CAPS capsule Take 1 capsule (50,000 Units total) by mouth every 7 (seven) days.  . zolpidem (AMBIEN) 5 MG tablet Take 1 tablet (5 mg total) by mouth at bedtime as needed for sleep.     Allergies:   Patient has no known allergies.   Social History   Socioeconomic History  . Marital status: Married    Spouse name: Not on file  . Number of children: 2  . Years of education: Not on file  . Highest education level: Not on file  Occupational History  . Occupation: disabled  Tobacco Use  . Smoking status: Former Smoker    Packs/day: 1.00    Types: Cigarettes    Quit date: 02/20/2007    Years since quitting: 13.3  . Smokeless tobacco: Never Used  Vaping Use  . Vaping Use: Never used  Substance and Sexual Activity  . Alcohol use: No    Alcohol/week: 0.0 standard drinks    Comment: None in years  . Drug use: No  . Sexual activity: Yes    Partners: Female  Other Topics Concern  .  Not on file  Social History Narrative   Pt lives with wife and daughter in [place].    Social Determinants of Health   Financial Resource Strain: Not on file  Food Insecurity: Not on file  Transportation Needs: Not on file  Physical Activity: Not on file  Stress: Not on file  Social Connections: Not on file     Family History: The patient's family history includes CAD in his mother; Diabetes in his mother; Liver disease in his father.  ROS:   Please see the history of present illness.    All other systems reviewed and are negative.  EKGs/Labs/Other Studies Reviewed:    The following studies were reviewed today: EKG reveals sinus rhythm and nonspecific ST-T changes.   Recent Labs: 06/14/2020: ALT 97; BUN 20; Creatinine, Ser 1.07; Hemoglobin 15.6; Platelets 190; Potassium 4.7; Sodium 140  Recent Lipid Panel    Component Value Date/Time   CHOL 151 06/14/2020 0835   TRIG 92 06/14/2020 0835   HDL 38 (L) 06/14/2020 0835   CHOLHDL 4.0 06/14/2020 0835   LDLCALC 96 06/14/2020 0835    Physical Exam:    VS:  BP 124/70   Pulse 74   Ht 5' 5" (1.651 m)   Wt 196 lb 3.2 oz (89 kg)   SpO2 95%   BMI 32.65 kg/m     Wt Readings from Last 3 Encounters:  06/26/20 196 lb 3.2 oz (89 kg)  06/14/20 196 lb (88.9 kg)  07/20/19 196 lb (88.9 kg)     GEN: Patient is in no acute distress HEENT: Normal NECK: No JVD; No carotid bruits LYMPHATICS: No lymphadenopathy CARDIAC: S1 S2 regular, 2/6 systolic murmur at the apex. RESPIRATORY:  Clear to auscultation without rales, wheezing or rhonchi  ABDOMEN: Soft, non-tender, non-distended MUSCULOSKELETAL:  No edema; No deformity  SKIN: Warm and dry NEUROLOGIC:  Alert and oriented x 3 PSYCHIATRIC:  Normal affect    Signed, Baylin Gamblin R Clema Skousen, MD  06/26/2020 4:15 PM     [facility]   

## 2020-06-27 LAB — CBC WITH DIFFERENTIAL/PLATELET
Basophils Absolute: 0 10*3/uL (ref 0.0–0.2)
Basos: 1 %
EOS (ABSOLUTE): 0.1 10*3/uL (ref 0.0–0.4)
Eos: 2 %
Hematocrit: 44.4 % (ref 37.5–51.0)
Hemoglobin: 15.1 g/dL (ref 13.0–17.7)
Immature Grans (Abs): 0 10*3/uL (ref 0.0–0.1)
Immature Granulocytes: 0 %
Lymphocytes Absolute: 1.4 10*3/uL (ref 0.7–3.1)
Lymphs: 21 %
MCH: 28.2 pg (ref 26.6–33.0)
MCHC: 34 g/dL (ref 31.5–35.7)
MCV: 83 fL (ref 79–97)
Monocytes Absolute: 0.6 10*3/uL (ref 0.1–0.9)
Monocytes: 9 %
Neutrophils Absolute: 4.5 10*3/uL (ref 1.4–7.0)
Neutrophils: 67 %
Platelets: 197 10*3/uL (ref 150–450)
RBC: 5.35 x10E6/uL (ref 4.14–5.80)
RDW: 12.7 % (ref 11.6–15.4)
WBC: 6.6 10*3/uL (ref 3.4–10.8)

## 2020-06-27 LAB — BASIC METABOLIC PANEL
BUN/Creatinine Ratio: 15 (ref 10–24)
BUN: 18 mg/dL (ref 8–27)
CO2: 25 mmol/L (ref 20–29)
Calcium: 8.2 mg/dL — ABNORMAL LOW (ref 8.6–10.2)
Chloride: 102 mmol/L (ref 96–106)
Creatinine, Ser: 1.2 mg/dL (ref 0.76–1.27)
GFR calc Af Amer: 72 mL/min/{1.73_m2} (ref >59)
GFR calc non Af Amer: 63 mL/min/{1.73_m2} (ref >59)
Glucose: 162 mg/dL — ABNORMAL HIGH (ref 65–99)
Potassium: 5 mmol/L (ref 3.5–5.2)
Sodium: 139 mmol/L (ref 134–144)

## 2020-06-29 NOTE — Progress Notes (Signed)
Attempted to call patient regarding procedure instructions for Monday morning.  Left voice mail on the following items: Nothing to eat or drink after midnight Need responsible person to drive you home as well as stay over night with you Take 1/2 insulin dose of Sunday night.  Do not take Glucophage on Monday morning, ok to take other medications on Monday morning including a baby asa.

## 2020-06-30 ENCOUNTER — Other Ambulatory Visit (HOSPITAL_COMMUNITY)
Admission: RE | Admit: 2020-06-30 | Discharge: 2020-06-30 | Disposition: A | Discharge status: Home / Self Care | Payer: Medicare Other | Source: Ambulatory Visit | Attending: Cardiovascular Disease | Admitting: Cardiovascular Disease

## 2020-06-30 DIAGNOSIS — Z20822 Contact with and (suspected) exposure to covid-19: Secondary | ICD-10-CM | POA: Diagnosis not present

## 2020-06-30 DIAGNOSIS — Z01812 Encounter for preprocedural laboratory examination: Secondary | ICD-10-CM | POA: Insufficient documentation

## 2020-07-01 LAB — SARS CORONAVIRUS 2 (TAT 6-24 HRS): SARS Coronavirus 2: NEGATIVE

## 2020-07-02 ENCOUNTER — Ambulatory Visit (HOSPITAL_COMMUNITY)
Admission: RE | Admit: 2020-07-02 | Discharge: 2020-07-02 | Disposition: A | Discharge status: Home / Self Care | Payer: Medicare Other | Attending: Cardiovascular Disease | Admitting: Cardiovascular Disease

## 2020-07-02 ENCOUNTER — Other Ambulatory Visit: Payer: Self-pay

## 2020-07-02 ENCOUNTER — Encounter (HOSPITAL_COMMUNITY): Payer: Self-pay | Admitting: Cardiovascular Disease

## 2020-07-02 ENCOUNTER — Encounter (HOSPITAL_COMMUNITY)
Admission: RE | Disposition: A | Discharge status: Home / Self Care | Payer: Self-pay | Source: Home / Self Care | Attending: Cardiovascular Disease

## 2020-07-02 DIAGNOSIS — Z79899 Other long term (current) drug therapy: Secondary | ICD-10-CM | POA: Insufficient documentation

## 2020-07-02 DIAGNOSIS — Z87891 Personal history of nicotine dependence: Secondary | ICD-10-CM | POA: Insufficient documentation

## 2020-07-02 DIAGNOSIS — I25118 Atherosclerotic heart disease of native coronary artery with other forms of angina pectoris: Secondary | ICD-10-CM

## 2020-07-02 DIAGNOSIS — E782 Mixed hyperlipidemia: Secondary | ICD-10-CM

## 2020-07-02 DIAGNOSIS — I2510 Atherosclerotic heart disease of native coronary artery without angina pectoris: Secondary | ICD-10-CM

## 2020-07-02 DIAGNOSIS — Z794 Long term (current) use of insulin: Secondary | ICD-10-CM | POA: Diagnosis not present

## 2020-07-02 DIAGNOSIS — Z955 Presence of coronary angioplasty implant and graft: Secondary | ICD-10-CM | POA: Insufficient documentation

## 2020-07-02 DIAGNOSIS — I10 Essential (primary) hypertension: Secondary | ICD-10-CM | POA: Diagnosis not present

## 2020-07-02 DIAGNOSIS — E669 Obesity, unspecified: Secondary | ICD-10-CM | POA: Insufficient documentation

## 2020-07-02 DIAGNOSIS — Z6832 Body mass index (BMI) 32.0-32.9, adult: Secondary | ICD-10-CM | POA: Diagnosis not present

## 2020-07-02 DIAGNOSIS — Z7984 Long term (current) use of oral hypoglycemic drugs: Secondary | ICD-10-CM | POA: Diagnosis not present

## 2020-07-02 DIAGNOSIS — E119 Type 2 diabetes mellitus without complications: Secondary | ICD-10-CM | POA: Insufficient documentation

## 2020-07-02 DIAGNOSIS — I209 Angina pectoris, unspecified: Secondary | ICD-10-CM

## 2020-07-02 DIAGNOSIS — E66811 Obesity, class 1: Secondary | ICD-10-CM

## 2020-07-02 DIAGNOSIS — E088 Diabetes mellitus due to underlying condition with unspecified complications: Secondary | ICD-10-CM

## 2020-07-02 DIAGNOSIS — Z7982 Long term (current) use of aspirin: Secondary | ICD-10-CM | POA: Insufficient documentation

## 2020-07-02 HISTORY — PX: LEFT HEART CATH AND CORONARY ANGIOGRAPHY: CATH118249

## 2020-07-02 LAB — GLUCOSE, CAPILLARY: Glucose-Capillary: 85 mg/dL (ref 70–99)

## 2020-07-02 SURGERY — LEFT HEART CATH AND CORONARY ANGIOGRAPHY

## 2020-07-02 MED ORDER — MIDAZOLAM HCL 2 MG/2ML IJ SOLN
INTRAMUSCULAR | Status: AC
  Filled 2020-07-02: qty 2

## 2020-07-02 MED ORDER — SODIUM CHLORIDE 0.9 % WEIGHT BASED INFUSION: 1.0000 mL/kg/h | INTRAVENOUS | Status: DC

## 2020-07-02 MED ORDER — HEPARIN SODIUM (PORCINE) 1000 UNIT/ML IJ SOLN
INTRAMUSCULAR | Status: AC
  Filled 2020-07-02: qty 1

## 2020-07-02 MED ORDER — HEPARIN SODIUM (PORCINE) 1000 UNIT/ML IJ SOLN
INTRAMUSCULAR | Status: DC | PRN
  Administered 2020-07-02: 4500 [IU] via INTRAVENOUS

## 2020-07-02 MED ORDER — FENTANYL CITRATE (PF) 100 MCG/2ML IJ SOLN
INTRAMUSCULAR | Status: AC
  Filled 2020-07-02: qty 2

## 2020-07-02 MED ORDER — IOHEXOL 350 MG/ML SOLN
INTRAVENOUS | Status: DC | PRN
  Administered 2020-07-02: 50 mL

## 2020-07-02 MED ORDER — FENTANYL CITRATE (PF) 100 MCG/2ML IJ SOLN
INTRAMUSCULAR | Status: DC | PRN
  Administered 2020-07-02: 25 ug via INTRAVENOUS

## 2020-07-02 MED ORDER — LIDOCAINE HCL (PF) 1 % IJ SOLN
INTRAMUSCULAR | Status: DC | PRN
  Administered 2020-07-02: 2 mL via INTRADERMAL

## 2020-07-02 MED ORDER — SODIUM CHLORIDE 0.9% FLUSH: 3.0000 mL | Freq: Two times a day (BID) | INTRAVENOUS | Status: DC

## 2020-07-02 MED ORDER — ASPIRIN 81 MG PO CHEW
81.0000 mg | CHEWABLE_TABLET | ORAL | Status: AC
  Administered 2020-07-02: 81 mg via ORAL
  Filled 2020-07-02: qty 1

## 2020-07-02 MED ORDER — HEPARIN (PORCINE) IN NACL 1000-0.9 UT/500ML-% IV SOLN
INTRAVENOUS | Status: AC
  Filled 2020-07-02: qty 1000

## 2020-07-02 MED ORDER — LIDOCAINE HCL (PF) 1 % IJ SOLN
INTRAMUSCULAR | Status: AC
  Filled 2020-07-02: qty 30

## 2020-07-02 MED ORDER — MIDAZOLAM HCL 2 MG/2ML IJ SOLN
INTRAMUSCULAR | Status: DC | PRN
  Administered 2020-07-02: 2 mg via INTRAVENOUS

## 2020-07-02 MED ORDER — HEPARIN (PORCINE) IN NACL 1000-0.9 UT/500ML-% IV SOLN
INTRAVENOUS | Status: DC | PRN
  Administered 2020-07-02 (×2): 500 mL

## 2020-07-02 MED ORDER — SODIUM CHLORIDE 0.9% FLUSH: 3.0000 mL | INTRAVENOUS | Status: DC | PRN

## 2020-07-02 MED ORDER — SODIUM CHLORIDE 0.9 % IV SOLN: 250.0000 mL | INTRAVENOUS | Status: DC | PRN

## 2020-07-02 MED ORDER — SODIUM CHLORIDE 0.9 % IV SOLN
INTRAVENOUS | Status: AC | PRN
  Administered 2020-07-02: 150 mL/h via INTRAVENOUS

## 2020-07-02 MED ORDER — VERAPAMIL HCL 2.5 MG/ML IV SOLN
INTRAVENOUS | Status: DC | PRN
  Administered 2020-07-02: 10 mL via INTRA_ARTERIAL

## 2020-07-02 MED ORDER — SODIUM CHLORIDE 0.9 % WEIGHT BASED INFUSION
3.0000 mL/kg/h | INTRAVENOUS | Status: AC
  Administered 2020-07-02: 3 mL/kg/h via INTRAVENOUS

## 2020-07-02 MED ORDER — VERAPAMIL HCL 2.5 MG/ML IV SOLN
INTRAVENOUS | Status: AC
  Filled 2020-07-02: qty 2

## 2020-07-02 SURGICAL SUPPLY — 12 items
BAG SNAP BAND KOVER 36X36 (MISCELLANEOUS) ×1 IMPLANT
CATH INFINITI JR4 5F (CATHETERS) ×1 IMPLANT
CATH OPTITORQUE TIG 4.0 5F (CATHETERS) ×1 IMPLANT
COVER DOME SNAP 22 D (MISCELLANEOUS) ×1 IMPLANT
DEVICE RAD COMP TR BAND LRG (VASCULAR PRODUCTS) ×1 IMPLANT
GLIDESHEATH SLEND SS 6F .021 (SHEATH) ×1 IMPLANT
GUIDEWIRE INQWIRE 1.5J.035X260 (WIRE) IMPLANT
INQWIRE 1.5J .035X260CM (WIRE) ×2
KIT HEART LEFT (KITS) ×2 IMPLANT
PACK CARDIAC CATHETERIZATION (CUSTOM PROCEDURE TRAY) ×2 IMPLANT
TRANSDUCER W/STOPCOCK (MISCELLANEOUS) ×2 IMPLANT
TUBING CIL FLEX 10 FLL-RA (TUBING) ×2 IMPLANT

## 2020-07-02 NOTE — Discharge Instructions (Signed)
Radial Site Care  This sheet gives you information about how to care for yourself after your procedure. Your health care provider may also give you more specific instructions. If you have problems or questions, contact your health care provider. What can I expect after the procedure? After the procedure, it is common to have:  Bruising and tenderness at the catheter insertion area. Follow these instructions at home: Medicines  Take over-the-counter and prescription medicines only as told by your health care provider. Insertion site care  Follow instructions from your health care provider about how to take care of your insertion site. Make sure you: ? Wash your hands with soap and water before you change your bandage (dressing). If soap and water are not available, use hand sanitizer. ? Change your dressing as told by your health care provider. ? Leave stitches (sutures), skin glue, or adhesive strips in place. These skin closures may need to stay in place for 2 weeks or longer. If adhesive strip edges start to loosen and curl up, you may trim the loose edges. Do not remove adhesive strips completely unless your health care provider tells you to do that.  Check your insertion site every day for signs of infection. Check for: ? Redness, swelling, or pain. ? Fluid or blood. ? Pus or a bad smell. ? Warmth.  Do not take baths, swim, or use a hot tub until your health care provider approves.  You may shower 24-48 hours after the procedure, or as directed by your health care provider. ? Remove the dressing and gently wash the site with plain soap and water. ? Pat the area dry with a clean towel. ? Do not rub the site. That could cause bleeding.  Do not apply powder or lotion to the site. Activity  For 24 hours after the procedure, or as directed by your health care provider: ? Do not flex or bend the affected arm. ? Do not push or pull heavy objects with the affected arm. ? Do not drive  yourself home from the hospital or clinic. You may drive 24 hours after the procedure unless your health care provider tells you not to. ? Do not operate machinery or power tools.  Do not lift anything that is heavier than 10 lb (4.5 kg), or the limit that you are told, until your health care provider says that it is safe.  Ask your health care provider when it is okay to: ? Return to work or school. ? Resume usual physical activities or sports. ? Resume sexual activity.   General instructions  If the catheter site starts to bleed, raise your arm and put firm pressure on the site. If the bleeding does not stop, get help right away. This is a medical emergency.  If you went home on the same day as your procedure, a responsible adult should be with you for the first 24 hours after you arrive home.  Keep all follow-up visits as told by your health care provider. This is important. Contact a health care provider if:  You have a fever.  You have redness, swelling, or yellow drainage around your insertion site. Get help right away if:  You have unusual pain at the radial site.  The catheter insertion area swells very fast.  The insertion area is bleeding, and the bleeding does not stop when you hold steady pressure on the area.  Your arm or hand becomes pale, cool, tingly, or numb. These symptoms may represent a serious   problem that is an emergency. Do not wait to see if the symptoms will go away. Get medical help right away. Call your local emergency services (911 in the [place] Do not drive yourself to the hospital. Summary  After the procedure, it is common to have bruising and tenderness at the site.  Follow instructions from your health care provider about how to take care of your radial site wound. Check the wound every day for signs of infection.  Do not lift anything that is heavier than 10 lb (4.5 kg), or the limit that you are told, until your health care provider says that it  is safe. This information is not intended to replace advice given to you by your health care provider. Make sure you discuss any questions you have with your health care provider. Document Revised: 06/10/2017 Document Reviewed: 06/10/2017 Elsevier Patient Education  2021 Elsevier Inc.  

## 2020-07-02 NOTE — Progress Notes (Signed)
Discharge instructions reviewed with patient and family. Verbalized understanding. 

## 2020-07-02 NOTE — Interval H&P Note (Signed)
Cath Lab Visit (complete for each Cath Lab visit)  Clinical Evaluation Leading to the Procedure:   ACS: No.  Non-ACS:    Anginal Classification: CCS III  Anti-ischemic medical therapy: Minimal Therapy (1 class of medications)  Non-Invasive Test Results: No non-invasive testing performed  Prior CABG: No previous CABG      History and Physical Interval Note:  07/02/2020 7:40 AM  Mitchell Hammond  has presented today for surgery, with the diagnosis of angina.  The various methods of treatment have been discussed with the patient and family. After consideration of risks, benefits and other options for treatment, the patient has consented to  Procedure(s): LEFT HEART CATH AND CORONARY ANGIOGRAPHY (N/A) as a surgical intervention.  The patient's history has been reviewed, patient examined, no change in status, stable for surgery.  I have reviewed the patient's chart and labs.  Questions were answered to the patient's satisfaction.     Nicki Guadalajara

## 2020-07-03 ENCOUNTER — Other Ambulatory Visit: Payer: Self-pay

## 2020-07-03 DIAGNOSIS — E088 Diabetes mellitus due to underlying condition with unspecified complications: Secondary | ICD-10-CM

## 2020-07-03 MED ORDER — INSULIN NPH ISOPHANE & REGULAR (70-30) 100 UNIT/ML ~~LOC~~ SUSP: 25.0000 [IU] | Freq: Two times a day (BID) | SUBCUTANEOUS | 6 refills | Status: DC

## 2020-07-17 ENCOUNTER — Other Ambulatory Visit: Payer: Self-pay | Admitting: Legal Medicine

## 2020-07-17 DIAGNOSIS — E1142 Type 2 diabetes mellitus with diabetic polyneuropathy: Secondary | ICD-10-CM

## 2020-07-17 MED ORDER — NOVOLIN 70/30 (70-30) 100 UNIT/ML ~~LOC~~ SUSP: 40.0000 [IU] | Freq: Every day | SUBCUTANEOUS | 11 refills | Status: DC

## 2020-07-24 ENCOUNTER — Other Ambulatory Visit: Payer: Self-pay

## 2020-07-24 ENCOUNTER — Telehealth: Payer: Self-pay

## 2020-07-24 ENCOUNTER — Ambulatory Visit (INDEPENDENT_AMBULATORY_CARE_PROVIDER_SITE_OTHER): Payer: Medicare Other | Admitting: Legal Medicine

## 2020-07-24 ENCOUNTER — Encounter: Payer: Self-pay | Admitting: Legal Medicine

## 2020-07-24 VITALS — BP 130/60 | HR 62 | Temp 97.6°F | Resp 16 | Ht 65.0 in | Wt 198.4 lb

## 2020-07-24 DIAGNOSIS — I2510 Atherosclerotic heart disease of native coronary artery without angina pectoris: Secondary | ICD-10-CM | POA: Diagnosis not present

## 2020-07-24 DIAGNOSIS — E782 Mixed hyperlipidemia: Secondary | ICD-10-CM | POA: Diagnosis not present

## 2020-07-24 DIAGNOSIS — I739 Peripheral vascular disease, unspecified: Secondary | ICD-10-CM | POA: Diagnosis not present

## 2020-07-24 DIAGNOSIS — E088 Diabetes mellitus due to underlying condition with unspecified complications: Secondary | ICD-10-CM

## 2020-07-24 DIAGNOSIS — I10 Essential (primary) hypertension: Secondary | ICD-10-CM

## 2020-07-24 DIAGNOSIS — E1142 Type 2 diabetes mellitus with diabetic polyneuropathy: Secondary | ICD-10-CM | POA: Diagnosis not present

## 2020-07-24 NOTE — Patient Instructions (Signed)
Leg Cramps Leg cramps occur when one or more muscles tighten and a person has no control over it (involuntary muscle contraction). Muscle cramps are most common in the calf muscles of the leg. They can occur during exercise or at rest. Leg cramps are painful, and they may last for a few seconds to a few minutes. Cramps may return several times before they finally stop. Usually, leg cramps are not caused by a serious medical problem. In many cases, the cause is not known. Some common causes include:  Excessive physical effort (overexertion), such as during intense exercise.  Doing the same motion over and over.  Staying in a certain position for a long period of time.  Improper preparation, form, or technique while doing a sport or an activity.  Dehydration.  Injury.  Side effects of certain medicines.  Abnormally low levels of minerals in your blood (electrolytes), especially potassium and calcium. This could result from: ? Pregnancy. ? Taking diuretic medicines. Follow these instructions at home: Eating and drinking  Drink enough fluid to keep your urine pale yellow. Staying hydrated may help prevent cramps.  Eat a healthy diet that includes plenty of nutrients to help your muscles function. A healthy diet includes fruits and vegetables, lean protein, whole grains, and low-fat or nonfat dairy products. Managing pain, stiffness, and swelling  Try massaging, stretching, and relaxing the affected muscle. Do this for several minutes at a time.  If directed, put ice on areas that are sore or painful after a cramp. To do this: ? Put ice in a plastic bag. ? Place a towel between your skin and the bag. ? Leave the ice on for 20 minutes, 2-3 times a day. ? Remove the ice if your skin turns bright red. This is very important. If you cannot feel pain, heat, or cold, you have a greater risk of damage to the area.  If directed, apply heat to muscles that are tense or tight. Do this before  you exercise, or as often as told by your health care provider. Use the heat source that your health care provider recommends, such as a moist heat pack or a heating pad. To do this: ? Place a towel between your skin and the heat source. ? Leave the heat on for 20-30 minutes. ? Remove the heat if your skin turns bright red. This is especially important if you are unable to feel pain, heat, or cold. You may have a greater risk of getting burned.  Try taking hot showers or baths to help relax tight muscles.      General instructions  If you are having frequent leg cramps, avoid intense exercise for several days.  Take over-the-counter and prescription medicines only as told by your health care provider.  Keep all follow-up visits. This is important. Contact a health care provider if:  Your leg cramps get more severe or more frequent, or they do not improve over time.  Your foot becomes cold, numb, or blue. Summary  Muscle cramps can develop in any muscle, but the most common place is in the calf muscles of the leg.  Leg cramps are painful, and they may last for a few seconds to a few minutes.  Usually, leg cramps are not caused by a serious medical problem. Often, the cause is not known.  Stay hydrated, and take over-the-counter and prescription medicines only as told by your health care provider. This information is not intended to replace advice given to you by your   health care provider. Make sure you discuss any questions you have with your health care provider. Document Revised: 09/21/2019 Document Reviewed: 09/21/2019 Elsevier Patient Education  2021 [employer] Inc.  

## 2020-07-24 NOTE — Telephone Encounter (Signed)
CCM referral 

## 2020-07-24 NOTE — Progress Notes (Signed)
Subjective:  Patient ID: Mitchell Hammond, male    DOB: 10-02-1955  Age: 65 y.o. MRN: [id number]  Chief Complaint  Patient presents with  . Diabetes    Patient states he has not been able to afford some of his medications because his co-payments have increased, has not gotten his Rx for ozempic. States he will have this resolved on April 1st due to insurance changing.  Marland Kitchen Hypertension    HPI: Chronic visit  Patient present with type 2 diabetes.  Specifically, this is type 2, insulin requiring diabetes, complicated by polyneuropathy.  Compliance with treatment has been good; patient take medicines as directed, maintains diet and exercise regimen, follows up as directed, and is keeping glucose diary.  Date of  diagnosis 2010.  Depression screen has been performed.Tobacco screen nonsmoker. Current medicines for diinsulin 70/30 40 units qd, metformin 1000mg  bid not on ozempicabetes .  Patient is on none for renal protection and atorvastatin for cholesterol control.  Patient performs foot exams daily and last ophthalmologic exam was yes  CORONARY ARTERY DISEASE  Patient presents in follow up of CAD. Patient was diagnosed in 2022. The patient has no associated CHF. The patient is currently taking a beta blocker, statin, and aspirin. CAD was diagnosed o years ago.  Patient is having occasional pain angina. Patient has used use NTG NTG.  Patient is followed by cardiology.  Patient had stent . Last angiography was 2022, last echocardiogram na..   From catheterization  "Prox RCA to Mid RCA lesion is 10% stenosed.  Dist RCA lesion is 20% stenosed.  Mid LM to Dist LM lesion is 10% stenosed.  Prox Cx lesion is 30% stenosed.  1st Mrg lesion is 20% stenosed.  Prox LAD lesion is 10% stenosed.  Mid LAD-1 lesion is 30% stenosed.  Previously placed Mid LAD-2 stent (unknown type) is widely patent.  The left ventricular ejection fraction is 55-65% by visual estimate.  The left ventricular systolic  function is normal.  LV end diastolic pressure is normal."  Pain in legs and hips, he had fall several months ago.  Current Outpatient Medications on File Prior to Visit  Medication Sig Dispense Refill  . aspirin EC 81 MG tablet Take 81 mg by mouth daily.    Marland Kitchen atorvastatin (LIPITOR) 40 MG tablet Take 1 tablet (40 mg total) by mouth daily at 6 PM. 90 tablet 2  . fenofibrate (TRICOR) 145 MG tablet TAKE 1 TABLET(145 MG) BY MOUTH DAILY (Patient taking differently: Take 145 mg by mouth daily.) 90 tablet 2  . gabapentin (NEURONTIN) 600 MG tablet Take 1 tablet (600 mg total) by mouth in the morning, at noon, in the evening, and at bedtime. 360 tablet 2  . insulin NPH-regular Human (NOVOLIN 70/30) (70-30) 100 UNIT/ML injection Inject 40 Units into the skin daily with breakfast. 21 mL 11  . metFORMIN (GLUCOPHAGE) 1000 MG tablet Take 1 tablet (1,000 mg total) by mouth 2 (two) times daily with a meal. 90 tablet 2  . metoprolol succinate (TOPROL-XL) 25 MG 24 hr tablet Take 1 tablet (25 mg total) by mouth daily. 90 tablet 2  . mupirocin cream (BACTROBAN) 2 % Apply 1 application topically 2 (two) times daily. (Patient taking differently: Apply 1 application topically daily as needed (Rash).) 15 g 0  . nitroGLYCERIN (NITROSTAT) 0.4 MG SL tablet Place 1 tablet (0.4 mg total) under the tongue every 5 (five) minutes as needed for chest pain. 25 tablet 6  . vitamin C (VITAMIN C) 500 MG  tablet Take 1 tablet (500 mg total) by mouth 2 (two) times daily. (Patient taking differently: Take 500 mg by mouth daily.) 60 tablet 2  . Vitamin D, Ergocalciferol, (DRISDOL) 1.25 MG (50000 UNIT) CAPS capsule Take 1 capsule (50,000 Units total) by mouth every 7 (seven) days. 12 capsule 2  . zolpidem (AMBIEN) 5 MG tablet Take 1 tablet (5 mg total) by mouth at bedtime as needed for sleep. 30 tablet 1   No current facility-administered medications on file prior to visit.   Past Medical History:  Diagnosis Date  . Angina pectoris  (HCC) 12/26/2015  . CAD (coronary artery disease)   . Chip fracture of triquetral bone of right wrist, closed, initial encounter 12/09/2016  . Chronic back pain   . Contracture of tendon sheath 08/20/2016  . 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 Diabetes mellitus due to underlying condition with unspecified complications (HCC) 12/26/2015  . Diabetic polyneuropathy associated with type 2 diabetes mellitus (HCC) 08/20/2016  . Discitis   . Essential hypertension 12/26/2015  . Fracture, tibial plateau 12/18/2014  . Hypertension   . 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 Metatarsalgia of both feet 08/20/2016  . MI (myocardial infarction) (HCC)    x 2, Multi-link Vision 3.5 x 15 mm BMS in 2009 (mid LAD), balloon 2015  . Mixed hyperlipidemia 06/14/2020  . Tibial plateau fracture 12/18/2014  . Vitamin D insufficiency    Past Surgical History:  Procedure Laterality Date  . BACK SURGERY     2014  . CARDIAC CATHETERIZATION     x 2, 2009 & 2015  . COLONOSCOPY    . CORONARY ANGIOPLASTY     stent - 2008, balloon - 2015  . FASCIOTOMY Right 12/19/2014   Procedure: ANTERIOR COMPARTMENTAL FASCIOTOMY;  Surgeon: Myrene Galas, MD;  Location: [facility] OR;  Service: Orthopedics;  Laterality: Right;  . HAND SURGERY Left   . HARDWARE REMOVAL Right 07/24/2015   Procedure: HARDWARE REMOVAL RIGHT TIBIA;  Surgeon: Myrene Galas, MD;  Location: [facility] OR;  Service: Orthopedics;  Laterality: Right;  . KNEE ARTHROSCOPY Right 07/24/2015   Procedure: RIGHT ARTHROSCOPY KNEE WITH PARTIAL SYNOVECTOMY AND MEDIAL MENISCECTOMY;  Surgeon: Myrene Galas, MD;  Location: [facility] OR;  Service: Orthopedics;  Laterality: Right;  . LEFT HEART CATH AND CORONARY ANGIOGRAPHY N/A 07/02/2020   Procedure: LEFT HEART CATH AND CORONARY ANGIOGRAPHY;  Surgeon: Lennette Bihari, MD;  Location: [facility] INVASIVE CV LAB;  Service: Cardiovascular;  Laterality: N/A;  . ORIF TIBIA PLATEAU Right 12/19/2014   Procedure: OPEN REDUCTION INTERNAL FIXATION  (ORIF) RIGHT TIBIAL PLATEAU;  Surgeon: Myrene Galas, MD;  Location: [facility] OR;  Service: Orthopedics;  Laterality: Right;  . TONSILLECTOMY    . VASECTOMY      Family History  Problem Relation Age of Onset  . Diabetes Mother   . CAD Mother   . Liver disease Father    Social History   Socioeconomic History  . Marital status: Married    Spouse name: Not on file  . Number of children: 2  . Years of education: Not on file  . Highest education level: Not on file  Occupational History  . Occupation: disabled  Tobacco Use  . Smoking status: Former Smoker    Packs/day: 1.00    Types: Cigarettes    Quit date: 02/20/2007    Years since quitting: 13.4  . Smokeless tobacco: Never Used  Vaping Use  . Vaping Use: Never used  Substance and Sexual Activity  . Alcohol use: No    Alcohol/week: 0.0 standard drinks    Comment: None in years  . Drug use: No  . Sexual activity: Yes    Partners: Female  Other Topics Concern  . Not on file  Social History Narrative   Pt lives with wife and daughter in [place].    Social Determinants of Health   Financial Resource Strain: Not on file  Food Insecurity: Not on file  Transportation Needs: Not on file  Physical Activity: Not on file  Stress: Not on file  Social Connections: Not on file    Review of Systems  Constitutional: Negative for activity change, appetite change and fatigue.  Eyes: Negative for visual disturbance.  Respiratory: Negative for choking and shortness of breath.   Cardiovascular: Negative for chest pain, palpitations and leg swelling.  Gastrointestinal: Negative for abdominal distention and abdominal pain.  Endocrine: Negative for polyuria.  Genitourinary: Negative for dysuria and urgency.  Musculoskeletal: Positive for arthralgias.  Skin: Negative.   Neurological: Negative.   Psychiatric/Behavioral: Negative.      Objective:  BP 130/60 (BP Location: Left Arm, Patient Position: Sitting, Cuff Size: Normal)   Pulse 62    Temp 97.6 F (36.4 C) (Temporal)   Resp 16   Ht 5\' 5"  (1.651 m)   Wt 198 lb 6.4 oz (90 kg)   SpO2 98%   BMI 33.02 kg/m   BP/Weight 07/24/2020 07/02/2020 06/26/2020  Systolic BP 130 115 124  Diastolic BP 60 69 70  Wt. (Lbs) 198.4 196 196.2  BMI 33.02 32.62 32.65    Physical Exam Vitals reviewed.  Constitutional:      Appearance: Normal appearance.  HENT:     Head: Normocephalic and atraumatic.     Right Ear: Tympanic membrane normal.     Mouth/Throat:     Mouth: Mucous membranes are moist.     Pharynx: Oropharynx is clear.  Eyes:     Conjunctiva/sclera: Conjunctivae normal.     Pupils: Pupils are equal, round, and reactive to light.  Cardiovascular:     Rate and Rhythm: Normal rate.     Pulses: Normal pulses.     Heart sounds: Normal heart sounds. No murmur heard.   Pulmonary:     Effort: Pulmonary effort is normal. No respiratory distress.     Breath sounds: Normal breath sounds. No rales.  Abdominal:     General: Abdomen is flat. Bowel sounds are normal. There is no distension.     Palpations: Abdomen is soft.  Musculoskeletal:        General: Tenderness (right knee) present. Normal range of motion.     Cervical back: Normal range of motion.  Skin:    General: Skin is warm and dry.     Capillary Refill: Capillary refill takes less than 2 seconds.  Neurological:     General: No focal deficit present.     Mental Status: He is alert. Mental status is at baseline.  Psychiatric:        Mood and Affect: Mood normal.    Depression screen [facility] 07/24/2020 07/20/2019  Decreased Interest 2 0  Down, Depressed, Hopeless 1 0  PHQ - 2 Score 3 0  Altered sleeping 3 -  Tired, decreased energy 1 -  Change in appetite 0 -  Feeling bad or failure about yourself  0 -  Trouble concentrating 1 -  Moving slowly or fidgety/restless 0 -  Suicidal thoughts 0 -  PHQ-9  Score 8 -  Difficult doing work/chores Somewhat difficult -     Diabetic Foot Exam - Simple   Simple Foot  Form Diabetic Foot exam was performed with the following findings: Yes 07/24/2020  9:19 AM  Visual Inspection No deformities, no ulcerations, no other skin breakdown bilaterally: Yes Sensation Testing See comments: Yes Pulse Check Posterior Tibialis and Dorsalis pulse intact bilaterally: Yes Comments Numbness feet      Lab Results  Component Value Date   WBC 6.6 06/26/2020   HGB 15.1 06/26/2020   HCT 44.4 06/26/2020   PLT 197 06/26/2020   GLUCOSE 162 (H) 06/26/2020   CHOL 151 06/14/2020   TRIG 92 06/14/2020   HDL 38 (L) 06/14/2020   LDLCALC 96 06/14/2020   ALT 97 (H) 06/14/2020   AST 61 (H) 06/14/2020   NA 139 06/26/2020   K 5.0 06/26/2020   CL 102 06/26/2020   CREATININE 1.20 06/26/2020   BUN 18 06/26/2020   CO2 25 06/26/2020   TSH 2.829 12/18/2014   INR 1.20 12/18/2014   HGBA1C 9.7 (H) 06/14/2020   MICROALBUR 80 06/14/2020      Assessment & Plan:  Diagnoses and all orders for this visit: Diabetic polyneuropathy associated with type 2 diabetes mellitus (HCC) An individual care plan for diabetes was established and reinforced today.  The patient's status was assessed using clinical findings on exam, labs and diagnostic testing. Patient success at meeting goals based on disease specific evidence-based guidelines and found to be good controlled. Medications were assessed and patient's understanding of the medical issues , including barriers were assessed. Recommend adherence to a diabetic diet, a graduated exercise program, HgbA1c level is checked quarterly, and urine microalbumin performed yearly .  Annual mono-filament sensation testing performed. Lower blood pressure and control hyperlipidemia is important. Get annual eye exams and annual flu shots and smoking cessation discussed.  Self management goals were discussed.  Essential hypertension An individual hypertension care plan was established and reinforced today.  The patient's status was assessed using clinical  findings on exam and labs or diagnostic tests. The patient's success at meeting treatment goals on disease specific evidence-based guidelines and found to be well controlled. SELF MANAGEMENT: The patient and I together assessed ways to personally work towards obtaining the recommended goals. RECOMMENDATIONS: avoid decongestants found in common cold remedies, decrease consumption of alcohol, perform routine monitoring of BP with home BP cuff, exercise, reduction of dietary salt, take medicines as prescribed, try not to miss doses and quit smoking.  Regular exercise and maintaining a healthy weight is needed.  Stress reduction may help. A CLINICAL SUMMARY including written plan identify barriers to care unique to individual due to social or financial issues.  We attempt to mutually creat solutions for individual and family understanding.  Mixed hyperlipidemia AN INDIVIDUAL CARE PLAN for hyperlipidemia/ cholesterol was established and reinforced today.  The patient's status was assessed using clinical findings on exam, lab and other diagnostic tests. The patient's disease status was assessed based on evidence-based guidelines and found to be well controlled. MEDICATIONS were reviewed. SELF MANAGEMENT GOALS have been discussed and patient's success at attaining the goal of low cholesterol was assessed. RECOMMENDATION given include regular exercise 3 days a week and low cholesterol/low fat diet. CLINICAL SUMMARY including written plan to identify barriers unique to the patient due to social or economic  reasons was discussed.  Coronary artery disease involving native coronary artery of native heart without angina pectoris Patient's CAD was assessed using history and  physical along with other information to maximize treatment.  Evidence based criteria was use in deciding proper management for this disease process.  Patient's CAD is under good control.therapy continue present treatment . PVD (peripheral  vascular disease) (HCC) -     VAS [place] ABI WITH/WO TBI; Future AN INDIVIDUAL CARE PLAN was established and reinforced today.  The patient's status was assessed using clinical findings on exam, labs, and other diagnostic testing. Patient's success at meeting treatment goals based on disease specific evidence-bassed guidelines and found to be in fair control. RECOMMENDATIONS include maintaing present medicines and treatment.     I spent 30 minutes dedicated to the care of this patient on the date of this encounter to include face-to-face time with the patient, as well as:   Follow-up: Return in about 2 months (around 09/23/2020) for diabetes.  An After Visit Summary was printed and given to the patient.  Brent Bulla, MD [facility] [phone]

## 2020-07-25 ENCOUNTER — Telehealth: Payer: Self-pay | Admitting: Legal Medicine

## 2020-07-25 NOTE — Progress Notes (Signed)
  Chronic Care Management   Outreach Note  07/25/2020 Name: Mitchell Hammond MRN: [id number] DOB: 11-06-1955  Referred by: Abigail Miyamoto, MD Reason for referral : No chief complaint on file.   An unsuccessful telephone outreach was attempted today. The patient was referred to the pharmacist for assistance with care management and care coordination.   Follow Up Plan:   Carley Perdue [employer] Scheduler

## 2020-07-31 ENCOUNTER — Telehealth: Payer: Self-pay | Admitting: Legal Medicine

## 2020-07-31 ENCOUNTER — Other Ambulatory Visit: Payer: Self-pay | Admitting: Legal Medicine

## 2020-07-31 DIAGNOSIS — E1142 Type 2 diabetes mellitus with diabetic polyneuropathy: Secondary | ICD-10-CM

## 2020-07-31 MED ORDER — HUMULIN 70/30 (70-30) 100 UNIT/ML ~~LOC~~ SUSP
40.0000 [IU] | Freq: Every day | SUBCUTANEOUS | 11 refills | Status: DC
Start: 2020-07-31 — End: 2020-08-29

## 2020-07-31 NOTE — Progress Notes (Signed)
  Chronic Care Management   Outreach Note  07/31/2020 Name: Mitchell Hammond MRN: [id number] DOB: [birth year]  Referred by: Abigail Miyamoto, MD Reason for referral : No chief complaint on file.   A second unsuccessful telephone outreach was attempted today. The patient was referred to pharmacist for assistance with care management and care coordination.  Follow Up Plan:   Carley Perdue [employer] Scheduler

## 2020-08-01 ENCOUNTER — Telehealth: Payer: Self-pay | Admitting: Legal Medicine

## 2020-08-01 NOTE — Progress Notes (Signed)
  Chronic Care Management   Note  08/01/2020 Name: Mitchell Hammond MRN: [id number] DOB: [birth year]  [name] is a 65 y.o. year old male who is a primary care patient of Abigail Miyamoto, MD. I reached out to [facility] by phone today in response to a referral sent by [name]'s PCP, Abigail Miyamoto, MD.   [name] was given information about Chronic Care Management services today including:  1. CCM service includes personalized support from designated clinical staff supervised by his physician, including individualized plan of care and coordination with other care providers 2. 24/7 contact phone numbers for assistance for urgent and routine care needs. 3. Service will only be billed when office clinical staff spend 20 minutes or more in a month to coordinate care. 4. Only one practitioner may furnish and bill the service in a calendar month. 5. The patient may stop CCM services at any time (effective at the end of the month) by phone call to the office staff.   Patient agreed to services and verbal consent obtained.   Follow up plan:   Carley Perdue [employer] Scheduler

## 2020-08-06 DIAGNOSIS — I10 Essential (primary) hypertension: Secondary | ICD-10-CM | POA: Insufficient documentation

## 2020-08-07 ENCOUNTER — Ambulatory Visit: Payer: Medicare Other | Admitting: Cardiology

## 2020-08-20 ENCOUNTER — Other Ambulatory Visit: Payer: Self-pay | Admitting: Legal Medicine

## 2020-08-20 DIAGNOSIS — E785 Hyperlipidemia, unspecified: Secondary | ICD-10-CM

## 2020-08-20 DIAGNOSIS — E088 Diabetes mellitus due to underlying condition with unspecified complications: Secondary | ICD-10-CM

## 2020-08-20 DIAGNOSIS — I10 Essential (primary) hypertension: Secondary | ICD-10-CM

## 2020-08-21 ENCOUNTER — Telehealth: Payer: Self-pay

## 2020-08-21 NOTE — Telephone Encounter (Signed)
Pt called upset due to only receiving one vial of insulin at pharmacy. Pt states he uses 40 U of insulin 2-3 times a day. Takes metformin 1000mg  twice a day with meals. Pt states almost everyday takes third dose because of BG. States BG in morning ranges in 300s and afternoon ranges 300-500.   Pt advised to see Dr. .   Made pt appointment for next week with Dr. Marina Goodell will also see pharmacist the same day. Appointment to get pt on better diabetic regimen.   Marina Goodell 08/21/20 4:36 PM

## 2020-08-23 NOTE — Progress Notes (Signed)
Chronic Care Management Pharmacy Note  08/29/2020 Name:  Mitchell Hammond MRN:  [id number] DOB:  23-Nov-1955   Plan Updates:   Patient is willing to begin Ozempic 0.25 mg x4 weeks, 0.5 mg x4 weeks with option to increase to 1 mg weekly if well tolerated. Recommend beginning Tresiba 40 units daily with close follow-up on blood sugar readings to adjust as needed.   Patient provided with freestyle Libre 2 in office and would like to see if insurance would cover. Please submit order to [facility].   Patient requesting a prescription for One Touch Verio meter.   Patient didn't realize directions on gabapentin was written for four times daily. He plans to try increasing to see if better symptom management.   Subjective: Mitchell Hammond is an 65 y.o. year old male who is a primary patient of Henrene Pastor, Zeb Comfort, MD.  The CCM team was consulted for assistance with disease management and care coordination needs.    Engaged with patient face to face for initial visit in response to provider referral for pharmacy case management and/or care coordination services.   Consent to Services:  The patient was given the following information about Chronic Care Management services today, agreed to services, and gave verbal consent: 1. CCM service includes personalized support from designated clinical staff supervised by the primary care provider, including individualized plan of care and coordination with other care providers 2. 24/7 contact phone numbers for assistance for urgent and routine care needs. 3. Service will only be billed when office clinical staff spend 20 minutes or more in a month to coordinate care. 4. Only one practitioner may furnish and bill the service in a calendar month. 5.The patient may stop CCM services at any time (effective at the end of the month) by phone call to the office staff. 6. The patient will be responsible for cost sharing (co-pay) of up to 20% of the service fee (after  annual deductible is met). Patient agreed to services and consent obtained.  Patient Care Team: Lillard Anes, MD as PCP - General (Family Medicine) Burnice Logan, [facility] as Pharmacist (Pharmacist)  Recent office visits: 07/24/2020 - vascular ultrasound ordered.  06/14/2020 - CBC normal, glucose 173, kidney tests normal, liver tests elevated- we will follow, A1c 9.7, stop glipizide, nurse visit to start ozempic or trulicity, Cholesterol good Recent consult visits: 07/02/2020 - heart catherization  06/26/2020 - cardio - CAD - symptoms are concerning. Ordered left heart catherization. Encourage weight loss and improved blood sugar.  04/23/2020 - cough/congestion - tessalon, mucinex and loratadine.  04/09/2020 - urgent care - knee and back pain - kenalog shot.   Hospital visits: None in previous 6 months  Objective:  Lab Results  Component Value Date   CREATININE 1.20 06/26/2020   BUN 18 06/26/2020   GFRNONAA 63 06/26/2020   GFRAA 72 06/26/2020   NA 139 06/26/2020   K 5.0 06/26/2020   CALCIUM 8.2 (L) 06/26/2020   CO2 25 06/26/2020   GLUCOSE 162 (H) 06/26/2020    Lab Results  Component Value Date/Time   HGBA1C 9.7 (H) 06/14/2020 08:35 AM   HGBA1C 10.2 (H) 07/20/2019 09:11 AM   MICROALBUR 80 06/14/2020 09:41 AM    Last diabetic Eye exam: No results found for: HMDIABEYEEXA  Last diabetic Foot exam: No results found for: HMDIABFOOTEX   Lab Results  Component Value Date   CHOL 151 06/14/2020   HDL 38 (L) 06/14/2020   LDLCALC 96 06/14/2020   TRIG 92  06/14/2020   CHOLHDL 4.0 06/14/2020    Hepatic Function Latest Ref Rng & Units 06/14/2020 07/20/2019 12/18/2014  Total Protein 6.0 - 8.5 g/dL 6.7 6.2 6.2(L)  Albumin 3.8 - 4.8 g/dL 4.5 4.2 3.5  AST 0 - 40 IU/L 61(H) 28 25  ALT 0 - 44 IU/L 97(H) 37 29  Alk Phosphatase 44 - 121 IU/L 87 82 81  Total Bilirubin 0.0 - 1.2 mg/dL 0.7 0.4 1.0    Lab Results  Component Value Date/Time   TSH 2.829 12/18/2014 11:46 AM    CBC  Latest Ref Rng & Units 06/26/2020 06/14/2020 07/20/2019  WBC 3.4 - 10.8 x10E3/uL 6.6 7.0 5.0  Hemoglobin 13.0 - 17.7 g/dL 15.1 15.6 14.4  Hematocrit 37.5 - 51.0 % 44.4 45.9 43.5  Platelets 150 - 450 x10E3/uL 197 190 169    Lab Results  Component Value Date/Time   VD25OH 22.8 (L) 12/18/2014 11:46 AM    Clinical ASCVD: Yes  The ASCVD Risk score Mikey Bussing DC Jr., et al., 2013) failed to calculate for the following reasons:   The patient has a prior MI or stroke diagnosis    Depression screen [facility] 2/9 07/24/2020 07/20/2019  Decreased Interest 2 0  Down, Depressed, Hopeless 1 0  PHQ - 2 Score 3 0  Altered sleeping 3 -  Tired, decreased energy 1 -  Change in appetite 0 -  Feeling bad or failure about yourself  0 -  Trouble concentrating 1 -  Moving slowly or fidgety/restless 0 -  Suicidal thoughts 0 -  PHQ-9 Score 8 -  Difficult doing work/chores Somewhat difficult -     Social History   Tobacco Use  Smoking Status Former Smoker  . Packs/day: 1.00  . Types: Cigarettes  . Quit date: 02/20/2007  . Years since quitting: 13.5  Smokeless Tobacco Never Used   BP Readings from Last 3 Encounters:  08/29/20 130/60  07/24/20 130/60  07/02/20 115/69   Pulse Readings from Last 3 Encounters:  08/29/20 70  07/24/20 62  07/02/20 68   Wt Readings from Last 3 Encounters:  08/29/20 195 lb (88.5 kg)  07/24/20 198 lb 6.4 oz (90 kg)  07/02/20 196 lb (88.9 kg)   BMI Readings from Last 3 Encounters:  08/29/20 32.45 kg/m  07/24/20 33.02 kg/m  07/02/20 32.62 kg/m    Assessment/Interventions: Review of patient past medical history, allergies, medications, health status, including review of consultants reports, laboratory and other test data, was performed as part of comprehensive evaluation and provision of chronic care management services.   SDOH:  (Social Determinants of Health) assessments and interventions performed: Yes  SDOH Screenings   Alcohol Screen: Not on file  Depression  (PHQ2-9): Medium Risk  . PHQ-2 Score: 8  Financial Resource Strain: Not on file  Food Insecurity: No Food Insecurity  . Worried About Charity fundraiser in the Last Year: Never true  . Ran Out of Food in the Last Year: Never true  Housing: Low Risk   . Last Housing Risk Score: 0  Physical Activity: Not on file  Social Connections: Not on file  Stress: Not on file  Tobacco Use: Medium Risk  . Smoking Tobacco Use: Former Smoker  . Smokeless Tobacco Use: Never Used  Transportation Needs: No Transportation Needs  . Lack of Transportation (Medical): No  . Lack of Transportation (Non-Medical): No    CCM Care Plan  No Known Allergies  Medications Reviewed Today    Reviewed by Lillard Anes, MD (Physician)  on 08/29/20 at 1015  Med List Status: <None>  Medication Order Taking? Sig Documenting Provider Last Dose Status Informant  aspirin EC 81 MG tablet [id number] Yes Take 81 mg by mouth daily. [provider] Taking Active Self  atorvastatin (LIPITOR) 40 MG tablet [id number] Yes Take 1 tablet by mouth once daily  Patient taking differently: Take 40 mg by mouth daily.   Lillard Anes, MD Taking Active   cholecalciferol (VITAMIN D3) 25 MCG (1000 UNIT) tablet [id number] Yes Take 1,000 Units by mouth daily. [provider] Taking Active   fenofibrate (TRICOR) 145 MG tablet [id number] Yes TAKE 1 TABLET(145 MG) BY MOUTH DAILY  Patient taking differently: Take 145 mg by mouth daily.   Lillard Anes, MD Taking Active Self  gabapentin (NEURONTIN) 600 MG tablet [id number] Yes Take 1 tablet (600 mg total) by mouth in the morning, at noon, in the evening, and at bedtime. Lillard Anes, MD Taking Active Self  insulin NPH-regular Human (HUMULIN 70/30) (70-30) 100 UNIT/ML injection [id number] Yes Inject 40 Units into the skin daily with supper.  Patient taking differently: Inject 40 Units into the skin daily with supper. Takes 25-30 units three times daily    Lillard Anes, MD Taking Active   metFORMIN (GLUCOPHAGE) 1000 MG tablet [id number] Yes Take 1 tablet by mouth twice daily Lillard Anes, MD Taking Active   metoprolol succinate (TOPROL-XL) 25 MG 24 hr tablet [id number] Yes Take 1 tablet by mouth once daily Lillard Anes, MD Taking Active   mupirocin cream (BACTROBAN) 2 % [id number] Yes Apply 1 application topically 2 (two) times daily.  Patient taking differently: Apply 1 application topically daily as needed (Rash).   Lillard Anes, MD Taking Active Self  nitroGLYCERIN (NITROSTAT) 0.4 MG SL tablet [id number] Yes Place 1 tablet (0.4 mg total) under the tongue every 5 (five) minutes as needed for chest pain. Lillard Anes, MD Taking Active Self  [facility] VERIO test strip [id number] Yes  [provider] Taking Active   tiZANidine (ZANAFLEX) 2 MG tablet [id number] Yes Take 2 mg by mouth at bedtime. [provider] Taking Active   vitamin C (VITAMIN C) 500 MG tablet [id number] Yes Take 1 tablet (500 mg total) by mouth 2 (two) times daily.  Patient taking differently: Take 500 mg by mouth daily.   Ainsley Spinner, PA-C Taking Active   zolpidem (AMBIEN) 5 MG tablet [id number] Yes Take 1 tablet (5 mg total) by mouth at bedtime as needed for sleep. Lillard Anes, MD Taking Active Self          Patient Active Problem List   Diagnosis Date Noted  . Hypertension   . Obesity (BMI 30.0-34.9) 06/26/2020  . Discitis   . MI (myocardial infarction) ([place])   . Mixed hyperlipidemia 06/14/2020  . 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 Chip fracture of triquetral bone of right wrist, closed, initial encounter 12/09/2016  . Contracture of tendon sheath 08/20/2016  . Diabetic polyneuropathy associated with type 2 diabetes mellitus ([place]) 08/20/2016  . Metatarsalgia of both feet 08/20/2016  . Essential hypertension 12/26/2015  . Diabetes mellitus due to underlying condition with  unspecified complications ([place]) 47/65/4650  . Angina pectoris ([place]) 12/26/2015  . Vitamin D insufficiency 12/20/2014  . CAD (coronary artery disease)   . Chronic back pain   . Tibial plateau fracture 12/18/2014  . Fracture, tibial plateau 12/18/2014    Immunization History  Administered Date(s) Administered  . Fluad  Quad(high Dose 65+) 06/14/2020  . Moderna Sars-Covid-2 Vaccination 09/19/2019, 10/17/2019, 05/07/2020  . Pneumococcal Polysaccharide-23 06/14/2020    Conditions to be addressed/monitored:  Hypertension, Hyperlipidemia, Diabetes, Coronary Artery Disease and Chronic pain, Vitamin D insufficiency  Care Plan : CCM Pharmacy Care Plan  Updates made by Burnice Logan, RPH since 08/29/2020 12:00 AM    Problem: CAD, HTN, HLD, DM   Priority: High  Onset Date: 08/29/2020    Long-Range Goal: Disease State Management   Start Date: 08/29/2020  Expected End Date: 08/29/2021  This Visit's Progress: On track  Priority: High  Note:     Current Barriers:  . Unable to independently afford treatment regimen . Unable to achieve control of diabetes   Pharmacist Clinical Goal(s):  Marland Kitchen Patient will verbalize ability to afford treatment regimen . achieve control of diabetes as evidenced by blood sugar readings and a1c through collaboration with PharmD and provider.   Interventions: . 1:1 collaboration with Lillard Anes, MD regarding development and update of comprehensive plan of care as evidenced by provider attestation and co-signature . Inter-disciplinary care team collaboration (see longitudinal plan of care) . Comprehensive medication review performed; medication list updated in electronic medical record  Hypertension (BP goal <130/80) -Controlled -Current treatment: . metoprolol succinate 25 mg once daily  -Medications previously tried: none reported  -Current home readings: not checking -Current dietary habits: light breakfast of cereal or fruit and fast food dinner.  Eats nuts occasionally for snack.  -Current exercise habits: stays active but no formal exercise due to leg/back pain.  -Denies hypotensive/hypertensive symptoms -Educated on BP goals and benefits of medications for prevention of heart attack, stroke and kidney damage; Daily salt intake goal < 2300 mg; Exercise goal of 150 minutes per week; Importance of home blood pressure monitoring; -Counseled to monitor BP at home weekly, document, and provide log at future appointments -Counseled on diet and exercise extensively Recommended to continue current medication Counseled on benefits of pool exercise.   Hyperlipidemia: (LDL goal < 55) -Not ideally controlled -Current treatment: . aspirin ec 81 mg daily  . Atorvastatin 40 mg daily  . Fenofibrate 145 mg daily  . nitrostat prn  -Medications previously tried: none reported  -Current dietary patterns: fast food for dinner and fruit/cereal for breakfast. Eats 2 main meals a day. Eats nuts for snacks.  -Current exercise habits: encouraged water aerobics due to back/leg pain.  -Educated on Cholesterol goals;  Benefits of statin for ASCVD risk reduction; Importance of limiting foods high in cholesterol; Exercise goal of 150 minutes per week; -Counseled on diet and exercise extensively Recommended to continue current medication Recommended packaging/delivery for medications. Will evaluate future lab results to determine if increased dose needed. Patient hopes Ozempic will assist him with weight loss.   Diabetes (A1c goal <7%) -Uncontrolled -Current medications: . metformin 1000 mg bid  . Insulin NPH 70/30  25-30 units 2-3 times daily  -Medications previously tried: ozempic, januvia, glipizide  -Current home glucose readings . fasting glucose: 90-150 mg/dL . post prandial glucose: 195-220 mg/dL -Denies hypoglycemic/hyperglycemic symptoms -Current meal patterns:  . breakfast: cereal or banana with coffee  . lunch: typically doesn't eat   . dinner: fast food - fried chicken with sides mostly . snacks: nuts  . drinks: water, sugar free soft drink, coffee, water with sugar free packet -Current exercise: limited due to back/leg pain. Does stay active around the house.  -Educated on A1c and blood sugar goals; Complications of diabetes including kidney damage, retinal damage, and  cardiovascular disease; Exercise goal of 150 minutes per week; Benefits of weight loss; Benefits of routine self-monitoring of blood sugar; Continuous glucose monitoring; -Counseled to check feet daily and get yearly eye exams -Counseled on diet and exercise extensively Recommended Tresiba 40 units daily and Ozempic 0.25 mg weekly for 4 weeks then 0.5 mg weekly for 4 weeks and consider increasing to 1 mg thereafter.  Educated on [employer] 2 and benefits. Provided patient with a sample and applied [place] 2 sensor in office.  Collaborated with Dr. Henrene Pastor and Lemmie Evens (nurse) to coordinate medications with [facility].  Assessed patient finances. Patient's current plan covers Ozempic and Tresiba at no charge.   Neuropathy/Pain (Goal: minimize symptoms of pain/neuropathy) -Not ideally controlled -Current treatment  . Gabapentin 600 mg qid -Medications previously tried: methocarbamol -Counseled on diet and exercise extensively Counseled on proper administration of gabapentin. Patient has been taking bid vs. qid. Advised patient to take QID.    Insomnia (Goal: improve sleep) -Controlled -Current treatment  . zolpidem 5 mg qhs prn  -Medications previously tried: none reported  -Counseled on diet and exercise extensively Recommended to continue current medication  Health Maintenance -Vaccine gaps: Recommended Shingrix, TDAP, fourth COVID vaccine  -Current therapy:  . vitamin c 500 mg bid . Vitamin D 1000 nits weekly -Educated on Herbal supplement research is limited and benefits usually cannot be proven -Patient is satisfied with current therapy and  denies issues -Counseled on diet and exercise extensively Recommended to continue current medication   Patient Goals/Self-Care Activities . Patient will:  - take medications as prescribed focus on medication adherence by using pharmacy packaging/delivery check glucose with Freestyle Libre 2, document, and provide at future appointments engage in dietary modifications by limiting carbohydrates.   Follow Up Plan: Telephone follow up appointment with care management team member scheduled for: 09/25/2020      Medication Assistance: None required.  Patient affirms current coverage meets needs. Patient has new coverage through [employer] that makes medications more affordable. Will contact pharmacist if any questions or concerns.   Patient's preferred pharmacy is:  [facility] [address] [place] - [place] [address] [place] [address] Phone: [phone] Fax: [phone]  [facility] - [place], [place] - [address] [place] [address] Phone: [phone] Fax: [phone]  Uses pill box? No - signing up for packaging Pt endorses fair compliance  We discussed: Benefits of medication synchronization, packaging and delivery as well as enhanced pharmacist oversight with [facility]. Patient decided to: Utilize [facility] pharmacy for medication synchronization, packaging and delivery   Verbal consent obtained for [facility] enhanced pharmacy services (medication synchronization, adherence packaging, delivery coordination). A medication sync plan was created to allow patient to get all medications delivered once every 30 to 90 days per patient preference. Patient understands they have freedom to choose pharmacy and clinical pharmacist will coordinate care between all prescribers and [facility].   Care Plan and Follow Up Patient Decision:  Patient agrees to Care Plan and Follow-up.  Plan:  Telephone follow up appointment with care management team member scheduled for:  09/25/2020

## 2020-08-27 ENCOUNTER — Telehealth: Payer: Self-pay

## 2020-08-27 NOTE — Progress Notes (Signed)
    Chronic Care Management Pharmacy Assistant   Name: [name]  MRN: [id number] DOB: 1955-06-02  Mitchell Hammond is an 65 y.o. year old male who presents for his initial CCM visit with the clinical pharmacist.  Reason for Encounter: Initial questions    Medications: Outpatient Encounter Medications as of 08/27/2020  Medication Sig  . aspirin EC 81 MG tablet Take 81 mg by mouth daily.  Marland Kitchen atorvastatin (LIPITOR) 40 MG tablet Take 1 tablet by mouth once daily  . fenofibrate (TRICOR) 145 MG tablet TAKE 1 TABLET(145 MG) BY MOUTH DAILY (Patient taking differently: Take 145 mg by mouth daily.)  . gabapentin (NEURONTIN) 600 MG tablet Take 1 tablet (600 mg total) by mouth in the morning, at noon, in the evening, and at bedtime.  . insulin NPH-regular Human (HUMULIN 70/30) (70-30) 100 UNIT/ML injection Inject 40 Units into the skin daily with supper.  . metFORMIN (GLUCOPHAGE) 1000 MG tablet Take 1 tablet by mouth twice daily  . metoprolol succinate (TOPROL-XL) 25 MG 24 hr tablet Take 1 tablet by mouth once daily  . mupirocin cream (BACTROBAN) 2 % Apply 1 application topically 2 (two) times daily. (Patient taking differently: Apply 1 application topically daily as needed (Rash).)  . nitroGLYCERIN (NITROSTAT) 0.4 MG SL tablet Place 1 tablet (0.4 mg total) under the tongue every 5 (five) minutes as needed for chest pain.  . vitamin C (VITAMIN C) 500 MG tablet Take 1 tablet (500 mg total) by mouth 2 (two) times daily. (Patient taking differently: Take 500 mg by mouth daily.)  . Vitamin D, Ergocalciferol, (DRISDOL) 1.25 MG (50000 UNIT) CAPS capsule Take 1 capsule (50,000 Units total) by mouth every 7 (seven) days.  Marland Kitchen zolpidem (AMBIEN) 5 MG tablet Take 1 tablet (5 mg total) by mouth at bedtime as needed for sleep.   No facility-administered encounter medications on file as of 08/27/2020.   Called patient on 08/27/2020 @ 1:21 pm/ LVM  Called patient on 08/28/2020 @ 9:09 am/ LVM  Called patient on  08/30/2020 @ 8:43 am/ LVM before realizing she has already had initial visit with Juliane Lack, CCM

## 2020-08-29 ENCOUNTER — Other Ambulatory Visit: Payer: Self-pay

## 2020-08-29 ENCOUNTER — Ambulatory Visit (INDEPENDENT_AMBULATORY_CARE_PROVIDER_SITE_OTHER): Payer: HMO

## 2020-08-29 ENCOUNTER — Ambulatory Visit (INDEPENDENT_AMBULATORY_CARE_PROVIDER_SITE_OTHER): Payer: HMO | Admitting: Legal Medicine

## 2020-08-29 ENCOUNTER — Encounter: Payer: Self-pay | Admitting: Legal Medicine

## 2020-08-29 ENCOUNTER — Telehealth: Payer: Self-pay

## 2020-08-29 VITALS — BP 130/60 | HR 70 | Temp 97.8°F | Resp 16 | Ht 65.0 in | Wt 195.0 lb

## 2020-08-29 DIAGNOSIS — E782 Mixed hyperlipidemia: Secondary | ICD-10-CM

## 2020-08-29 DIAGNOSIS — I25118 Atherosclerotic heart disease of native coronary artery with other forms of angina pectoris: Secondary | ICD-10-CM

## 2020-08-29 DIAGNOSIS — L739 Follicular disorder, unspecified: Secondary | ICD-10-CM | POA: Diagnosis not present

## 2020-08-29 DIAGNOSIS — I10 Essential (primary) hypertension: Secondary | ICD-10-CM

## 2020-08-29 DIAGNOSIS — E1142 Type 2 diabetes mellitus with diabetic polyneuropathy: Secondary | ICD-10-CM

## 2020-08-29 DIAGNOSIS — E088 Diabetes mellitus due to underlying condition with unspecified complications: Secondary | ICD-10-CM

## 2020-08-29 MED ORDER — MUPIROCIN 2 % EX OINT: TOPICAL_OINTMENT | Freq: Two times a day (BID) | CUTANEOUS | 6 refills | Status: DC

## 2020-08-29 MED ORDER — OZEMPIC (0.25 OR 0.5 MG/DOSE) 2 MG/1.5ML ~~LOC~~ SOPN: 0.5000 mg | PEN_INJECTOR | SUBCUTANEOUS | 6 refills | Status: DC

## 2020-08-29 MED ORDER — ONETOUCH VERIO VI STRP: ORAL_STRIP | 2 refills | Status: DC

## 2020-08-29 MED ORDER — ONETOUCH DELICA LANCETS 30G MISC: 1.0000 | Freq: Every day | 1 refills | Status: DC

## 2020-08-29 MED ORDER — METOPROLOL SUCCINATE ER 25 MG PO TB24: 1.0000 | ORAL_TABLET | Freq: Every day | ORAL | 2 refills | Status: DC

## 2020-08-29 MED ORDER — TRESIBA FLEXTOUCH 100 UNIT/ML ~~LOC~~ SOPN
40.0000 [IU] | PEN_INJECTOR | Freq: Every day | SUBCUTANEOUS | 3 refills | Status: DC
Start: 2020-08-29 — End: 2021-01-01

## 2020-08-29 MED ORDER — ONETOUCH DELICA LANCING DEV MISC: 1.0000 | Freq: Every day | 0 refills | Status: DC

## 2020-08-29 NOTE — Patient Instructions (Addendum)
Visit Information  Thank you for your time discussing your medications. I look forward to working with you to achieve your health care goals. Below is a summary of what we talked about during our visit.   Goals Addressed            This Visit's Progress   . Learn More About My Health       Timeframe:  Long-Range Goal Priority:  High Start Date:                             Expected End Date:                        Follow Up Date 09/25/2020    - tell my story and reason for my visit - repeat what I heard to make sure I understand - bring a list of my medicines to the visit    Why is this important?    The best way to learn about your health and care is by talking to the doctor and nurse.   They will answer your questions and give you information in the way that you like best.    Notes:     Marland Kitchen Manage My Medicine       Timeframe:  Long-Range Goal Priority:  High Start Date:                             Expected End Date:                       Follow Up Date 09/25/2020    - call for medicine refill 2 or 3 days before it runs out - learn to read medicine labels - use a pillbox to sort medicine    Why is this important?   . These steps will help you keep on track with your medicines.   Notes:     Marland Kitchen Monitor and Manage My Blood Sugar-Diabetes Type 2       Timeframe:  Long-Range Goal Priority:  High Start Date:                             Expected End Date:                       Follow Up Date 09/25/2020    - check blood sugar at prescribed times - check blood sugar if I feel it is too high or too low - take the blood sugar log to all doctor visits    Why is this important?    Checking your blood sugar at home helps to keep it from getting very high or very low.   Writing the results in a diary or log helps the doctor know how to care for you.   Your blood sugar log should have the time, date and the results.   Also, write down the amount of insulin or other  medicine that you take.   Other information, like what you ate, exercise done and how you were feeling, will also be helpful.     Notes:        Patient Care Plan: CCM Pharmacy Care Plan    Problem Identified: CAD, HTN, HLD, DM   Priority: High  Onset Date: 08/29/2020  Long-Range Goal: Disease State Management   Start Date: 08/29/2020  Expected End Date: 08/29/2021  This Visit's Progress: On track  Priority: High  Note:     Current Barriers:  . Unable to independently afford treatment regimen . Unable to achieve control of diabetes   Pharmacist Clinical Goal(s):  Marland Kitchen Patient will verbalize ability to afford treatment regimen . achieve control of diabetes as evidenced by blood sugar readings and a1c through collaboration with PharmD and provider.   Interventions: . 1:1 collaboration with Abigail Miyamoto, MD regarding development and update of comprehensive plan of care as evidenced by provider attestation and co-signature . Inter-disciplinary care team collaboration (see longitudinal plan of care) . Comprehensive medication review performed; medication list updated in electronic medical record  Hypertension (BP goal <130/80) -Controlled -Current treatment: . metoprolol succinate 25 mg once daily  -Medications previously tried: none reported  -Current home readings: not checking -Current dietary habits: light breakfast of cereal or fruit and fast food dinner. Eats nuts occasionally for snack.  -Current exercise habits: stays active but no formal exercise due to leg/back pain.  -Denies hypotensive/hypertensive symptoms -Educated on BP goals and benefits of medications for prevention of heart attack, stroke and kidney damage; Daily salt intake goal < 2300 mg; Exercise goal of 150 minutes per week; Importance of home blood pressure monitoring; -Counseled to monitor BP at home weekly, document, and provide log at future appointments -Counseled on diet and exercise  extensively Recommended to continue current medication Counseled on benefits of pool exercise.   Hyperlipidemia: (LDL goal < 55) -Not ideally controlled -Current treatment: . aspirin ec 81 mg daily  . Atorvastatin 40 mg daily  . Fenofibrate 145 mg daily  . nitrostat prn  -Medications previously tried: none reported  -Current dietary patterns: fast food for dinner and fruit/cereal for breakfast. Eats 2 main meals a day. Eats nuts for snacks.  -Current exercise habits: encouraged water aerobics due to back/leg pain.  -Educated on Cholesterol goals;  Benefits of statin for ASCVD risk reduction; Importance of limiting foods high in cholesterol; Exercise goal of 150 minutes per week; -Counseled on diet and exercise extensively Recommended to continue current medication Recommended packaging/delivery for medications. Will evaluate future lab results to determine if increased dose needed. Patient hopes Ozempic will assist him with weight loss.   Diabetes (A1c goal <7%) -Uncontrolled -Current medications: . metformin 1000 mg bid  . Insulin NPH 70/30  25-30 units 2-3 times daily  -Medications previously tried: ozempic, januvia, glipizide  -Current home glucose readings . fasting glucose: 90-150 mg/dL . post prandial glucose: 195-220 mg/dL -Denies hypoglycemic/hyperglycemic symptoms -Current meal patterns:  . breakfast: cereal or banana with coffee  . lunch: typically doesn't eat  . dinner: fast food - fried chicken with sides mostly . snacks: nuts  . drinks: water, sugar free soft drink, coffee, water with sugar free packet -Current exercise: limited due to back/leg pain. Does stay active around the house.  -Educated on A1c and blood sugar goals; Complications of diabetes including kidney damage, retinal damage, and cardiovascular disease; Exercise goal of 150 minutes per week; Benefits of weight loss; Benefits of routine self-monitoring of blood sugar; Continuous glucose  monitoring; -Counseled to check feet daily and get yearly eye exams -Counseled on diet and exercise extensively Recommended Tresiba 40 units daily and Ozempic 0.25 mg weekly for 4 weeks then 0.5 mg weekly for 4 weeks and consider increasing to 1 mg thereafter.  Educated on [employer] 2 and benefits. Provided patient  with a sample and applied [place] 2 sensor in office.  Collaborated with Dr. Marina Goodell and Leia Alf (nurse) to coordinate medications with [facility].  Assessed patient finances. Patient's current plan covers Ozempic and Tresiba at no charge.   Neuropathy/Pain (Goal: minimize symptoms of pain/neuropathy) -Not ideally controlled -Current treatment  . Gabapentin 600 mg qid -Medications previously tried: methocarbamol -Counseled on diet and exercise extensively Counseled on proper administration of gabapentin. Patient has been taking bid vs. qid. Advised patient to take QID.    Insomnia (Goal: improve sleep) -Controlled -Current treatment  . zolpidem 5 mg qhs prn  -Medications previously tried: none reported  -Counseled on diet and exercise extensively Recommended to continue current medication  Health Maintenance -Vaccine gaps: Recommended Shingrix, TDAP, fourth COVID vaccine  -Current therapy:  . vitamin c 500 mg bid . Vitamin D 1000 nits weekly -Educated on Herbal supplement research is limited and benefits usually cannot be proven -Patient is satisfied with current therapy and denies issues -Counseled on diet and exercise extensively Recommended to continue current medication   Patient Goals/Self-Care Activities . Patient will:  - take medications as prescribed focus on medication adherence by using pharmacy packaging/delivery check glucose with [employer] 2, document, and provide at future appointments engage in dietary modifications by limiting carbohydrates.   Follow Up Plan: Telephone follow up appointment with care management team member scheduled for:  09/25/2020      Mitchell Hammond was given information about Chronic Care Management services today including:  1. CCM service includes personalized support from designated clinical staff supervised by his physician, including individualized plan of care and coordination with other care providers 2. 24/7 contact phone numbers for assistance for urgent and routine care needs. 3. Standard insurance, coinsurance, copays and deductibles apply for chronic care management only during months in which we provide at least 20 minutes of these services. Most insurances cover these services at 100%, however patients may be responsible for any copay, coinsurance and/or deductible if applicable. This service may help you avoid the need for more expensive face-to-face services. 4. Only one practitioner may furnish and bill the service in a calendar month. 5. The patient may stop CCM services at any time (effective at the end of the month) by phone call to the office staff.  Patient agreed to services and verbal consent obtained.   The patient verbalized understanding of instructions, educational materials, and care plan provided today and declined offer to receive copy of patient instructions, educational materials, and care plan.  Telephone follow up appointment with pharmacy team member scheduled for: 09/25/2020 for Ozempic adjustment and blood sugar check   Juliane Lack, PharmD Clinical Pharmacist [facility] [phone] (office) [phone] (mobile)  [email or web address]">  DASH Eating Plan DASH stands for Dietary Approaches to Stop Hypertension. The DASH eating plan is a healthy eating plan that has been shown to:  Reduce high blood pressure (hypertension).  Reduce your risk for type 2 diabetes, heart disease, and stroke.  Help with weight loss. What are tips for following this plan? Reading food labels  Check food labels for the amount of  salt (sodium) per serving. Choose foods with less than 5 percent of the Daily Value of sodium. Generally, foods with less than 300 milligrams (mg) of sodium per serving fit into this eating plan.  To find whole grains, look for the word "whole" as the first word in the ingredient list. Shopping  Buy products labeled as "low-sodium" or "no salt added."  Buy fresh foods.  Avoid canned foods and pre-made or frozen meals. Cooking  Avoid adding salt when cooking. Use salt-free seasonings or herbs instead of table salt or sea salt. Check with your health care provider or pharmacist before using salt substitutes.  Do not fry foods. Cook foods using healthy methods such as baking, boiling, grilling, roasting, and broiling instead.  Cook with heart-healthy oils, such as olive, canola, avocado, soybean, or sunflower oil. Meal planning  Eat a balanced diet that includes: ? 4 or more servings of fruits and 4 or more servings of vegetables each day. Try to fill one-half of your plate with fruits and vegetables. ? 6-8 servings of whole grains each day. ? Less than 6 oz (170 g) of lean meat, poultry, or fish each day. A 3-oz (85-g) serving of meat is about the same size as a deck of cards. One egg equals 1 oz (28 g). ? 2-3 servings of low-fat dairy each day. One serving is 1 cup (237 mL). ? 1 serving of nuts, seeds, or beans 5 times each week. ? 2-3 servings of heart-healthy fats. Healthy fats called omega-3 fatty acids are found in foods such as walnuts, flaxseeds, fortified milks, and eggs. These fats are also found in cold-water fish, such as sardines, salmon, and mackerel.  Limit how much you eat of: ? Canned or prepackaged foods. ? Food that is high in trans fat, such as some fried foods. ? Food that is high in saturated fat, such as fatty meat. ? Desserts and other sweets, sugary drinks, and other foods with added sugar. ? Full-fat dairy products.  Do not salt foods before eating.  Do not eat  more than 4 egg yolks a week.  Try to eat at least 2 vegetarian meals a week.  Eat more home-cooked food and less restaurant, buffet, and fast food.   Lifestyle  When eating at a restaurant, ask that your food be prepared with less salt or no salt, if possible.  If you drink alcohol: ? Limit how much you use to:  0-1 drink a day for women who are not pregnant.  0-2 drinks a day for men. ? Be aware of how much alcohol is in your drink. In the [place] one drink equals one 12 oz bottle of beer (355 mL), one 5 oz glass of wine (148 mL), or one 1 oz glass of hard liquor (44 mL). General information  Avoid eating more than 2,300 mg of salt a day. If you have hypertension, you may need to reduce your sodium intake to 1,500 mg a day.  Work with your health care provider to maintain a healthy body weight or to lose weight. Ask what an ideal weight is for you.  Get at least 30 minutes of exercise that causes your heart to beat faster (aerobic exercise) most days of the week. Activities may include walking, swimming, or biking.  Work with your health care provider or dietitian to adjust your eating plan to your individual calorie needs. What foods should I eat? Fruits All fresh, dried, or frozen fruit. Canned fruit in natural juice (without added sugar). Vegetables Fresh or frozen vegetables (raw, steamed, roasted, or grilled). Low-sodium or reduced-sodium tomato and vegetable juice. Low-sodium or reduced-sodium tomato sauce and tomato paste. Low-sodium or reduced-sodium canned vegetables. Grains Whole-grain or whole-wheat bread. Whole-grain or whole-wheat pasta. Jamell Opfer rice. Orpah Cobb. Bulgur. Whole-grain and low-sodium cereals. Pita bread. Low-fat, low-sodium crackers. Whole-wheat flour tortillas. Meats and other proteins Skinless chicken or [place]. Ground chicken  or [place]. Pork with fat trimmed off. Fish and seafood. Egg whites. Dried beans, peas, or lentils. Unsalted nuts, nut butters,  and seeds. Unsalted canned beans. Lean cuts of beef with fat trimmed off. Low-sodium, lean precooked or cured meat, such as sausages or meat loaves. Dairy Low-fat (1%) or fat-free (skim) milk. Reduced-fat, low-fat, or fat-free cheeses. Nonfat, low-sodium ricotta or cottage cheese. Low-fat or nonfat yogurt. Low-fat, low-sodium cheese. Fats and oils Soft margarine without trans fats. Vegetable oil. Reduced-fat, low-fat, or light mayonnaise and salad dressings (reduced-sodium). Canola, safflower, olive, avocado, soybean, and sunflower oils. Avocado. Seasonings and condiments Herbs. Spices. Seasoning mixes without salt. Other foods Unsalted popcorn and pretzels. Fat-free sweets. The items listed above may not be a complete list of foods and beverages you can eat. Contact a dietitian for more information. What foods should I avoid? Fruits Canned fruit in a light or heavy syrup. Fried fruit. Fruit in cream or butter sauce. Vegetables Creamed or fried vegetables. Vegetables in a cheese sauce. Regular canned vegetables (not low-sodium or reduced-sodium). Regular canned tomato sauce and paste (not low-sodium or reduced-sodium). Regular tomato and vegetable juice (not low-sodium or reduced-sodium). Rosita FirePickles. Olives. Grains Baked goods made with fat, such as croissants, muffins, or some breads. Dry pasta or rice meal packs. Meats and other proteins Fatty cuts of meat. Ribs. Fried meat. Tomasa BlaseBacon. Bologna, salami, and other precooked or cured meats, such as sausages or meat loaves. Fat from the back of a pig (fatback). Bratwurst. Salted nuts and seeds. Canned beans with added salt. Canned or smoked fish. Whole eggs or egg yolks. Chicken or [place] with skin. Dairy Whole or 2% milk, cream, and half-and-half. Whole or full-fat cream cheese. Whole-fat or sweetened yogurt. Full-fat cheese. Nondairy creamers. Whipped toppings. Processed cheese and cheese spreads. Fats and oils Butter. Stick margarine. Lard.  Shortening. Ghee. Bacon fat. Tropical oils, such as coconut, palm kernel, or palm oil. Seasonings and condiments Onion salt, garlic salt, seasoned salt, table salt, and sea salt. Worcestershire sauce. Tartar sauce. Barbecue sauce. Teriyaki sauce. Soy sauce, including reduced-sodium. Steak sauce. Canned and packaged gravies. Fish sauce. Oyster sauce. Cocktail sauce. Store-bought horseradish. Ketchup. Mustard. Meat flavorings and tenderizers. Bouillon cubes. Hot sauces. Pre-made or packaged marinades. Pre-made or packaged taco seasonings. Relishes. Regular salad dressings. Other foods Salted popcorn and pretzels. The items listed above may not be a complete list of foods and beverages you should avoid. Contact a dietitian for more information. Where to find more information  National Heart, Lung, and Blood Institute: [employer]am.iswww.nhlbi.nih.gov  American Heart Association: www.heart.org  Academy of Nutrition and Dietetics: www.eatright.org  National Kidney Foundation: www.kidney.org Summary  The DASH eating plan is a healthy eating plan that has been shown to reduce high blood pressure (hypertension). It may also reduce your risk for type 2 diabetes, heart disease, and stroke.  When on the DASH eating plan, aim to eat more fresh fruits and vegetables, whole grains, lean proteins, low-fat dairy, and heart-healthy fats.  With the DASH eating plan, you should limit salt (sodium) intake to 2,300 mg a day. If you have hypertension, you may need to reduce your sodium intake to 1,500 mg a day.  Work with your health care provider or dietitian to adjust your eating plan to your individual calorie needs. This information is not intended to replace advice given to you by your health care provider. Make sure you discuss any questions you have with your health care provider. Document Revised: 04/08/2019 Document Reviewed: 04/08/2019 Elsevier Patient Education  [place].

## 2020-08-29 NOTE — Progress Notes (Signed)
    Chronic Care Management Pharmacy Assistant   Name: Mitchell Hammond  MRN: [id number] DOB: 10/10/1955  Reason for Encounter: Medication coordination for [facility] onboarding  Medications: Outpatient Encounter Medications as of 08/29/2020  Medication Sig  . aspirin EC 81 MG tablet Take 81 mg by mouth daily.  Marland Kitchen atorvastatin (LIPITOR) 40 MG tablet Take 1 tablet by mouth once daily (Patient taking differently: Take 40 mg by mouth daily.)  . cholecalciferol (VITAMIN D3) 25 MCG (1000 UNIT) tablet Take 1,000 Units by mouth daily.  . fenofibrate (TRICOR) 145 MG tablet TAKE 1 TABLET(145 MG) BY MOUTH DAILY (Patient taking differently: Take 145 mg by mouth daily.)  . gabapentin (NEURONTIN) 600 MG tablet Take 1 tablet (600 mg total) by mouth in the morning, at noon, in the evening, and at bedtime.  . insulin degludec (TRESIBA FLEXTOUCH) 100 UNIT/ML FlexTouch Pen Inject 40 Units into the skin daily.  . metFORMIN (GLUCOPHAGE) 1000 MG tablet Take 1 tablet by mouth twice daily  . metoprolol succinate (TOPROL-XL) 25 MG 24 hr tablet Take 1 tablet by mouth once daily  . mupirocin ointment (BACTROBAN) 2 % Apply topically 2 (two) times daily.  . nitroGLYCERIN (NITROSTAT) 0.4 MG SL tablet Place 1 tablet (0.4 mg total) under the tongue every 5 (five) minutes as needed for chest pain.  Letta Pate VERIO test strip   . Semaglutide,0.25 or 0.5MG /DOS, (OZEMPIC, 0.25 OR 0.5 MG/DOSE,) 2 MG/1.5ML SOPN Inject 0.5 mg into the skin once a week.  Marland Kitchen tiZANidine (ZANAFLEX) 2 MG tablet Take 2 mg by mouth at bedtime. (Patient not taking: Reported on 08/29/2020)  . vitamin C (VITAMIN C) 500 MG tablet Take 1 tablet (500 mg total) by mouth 2 (two) times daily. (Patient taking differently: Take 500 mg by mouth daily.)  . zolpidem (AMBIEN) 5 MG tablet Take 1 tablet (5 mg total) by mouth at bedtime as needed for sleep.   No facility-administered encounter medications on file as of 08/29/2020.    Lucia Gaskins, CPP has completed an  onboarding form for [facility].  Patient is going to be doing 30ds for packs  I have called the patients pharmacy to request profile transfer.  I have filled out acute fill form for 08/31/20 for the following medications  Lucia Gaskins, CPP has sent over the necessary refills needed.  Leilani Able, CMA Clinical Pharmacist Assistant [phone]

## 2020-08-29 NOTE — Progress Notes (Signed)
Subjective:  Patient ID: Mitchell Hammond, male    DOB: [birth year]  Age: 65 y.o. MRN: [id number]  Chief Complaint  Patient presents with  . Diabetes  . Hyperlipidemia  . Hypertension    HPI: follow up for diabetes and changes in medicines. We are having poor control of diabetes.  Mitchell Hammond has arranged for him to get tresiba and start ozempic. Last A1c was 9.7.  Patient present with type 2 diabetes.  Specifically, this is type 2, insulin requiring diabetes, complicated by polyneuropathy.  Compliance with treatment has been good; patient take medicines as directed, maintains diet and exercise regimen, follows up as directed, and is keeping glucose diary.  Date of  diagnosis 2010.  Depression screen has been performed.Tobacco screen nonsmoker. Current medicines for diabetes ozempic 0.5 mg, tresiba 40 units.  Patient is on none for renal protection and atorvastatin for cholesterol control.  Patient performs foot exams daily and last ophthalmologic exam was no.  Patient presents for follow up of hypertension.  Patient tolerating metoprolol well with side effects.  Patient was diagnosed with hypertension 2010 so has been treated for hypertension for 10 years.Patient is working on maintaining diet and exercise regimen and follows up as directed. Complication include cad..  CORONARY ARTERY DISEASE  Patient presents in follow up of CAD. Patient was diagnosed in 2008. The patient has no associated CHF. The patient is currently taking a beta blocker, statin, and aspirin. CAD was diagnosed 14 years ago.  Patient is having no angina. Patient has used no NTG.  Patient is followed by cardiology.  Patient had 2 stents . Last angiography was 2008, last echocardiogram na.   Current Outpatient Medications on File Prior to Visit  Medication Sig Dispense Refill  . aspirin EC 81 MG tablet Take 81 mg by mouth daily.    Marland Kitchen atorvastatin (LIPITOR) 40 MG tablet Take 1 tablet by mouth once daily (Patient taking  differently: Take 40 mg by mouth daily.) 90 tablet 2  . fenofibrate (TRICOR) 145 MG tablet TAKE 1 TABLET(145 MG) BY MOUTH DAILY (Patient taking differently: Take 145 mg by mouth daily.) 90 tablet 2  . gabapentin (NEURONTIN) 600 MG tablet Take 1 tablet (600 mg total) by mouth in the morning, at noon, in the evening, and at bedtime. 360 tablet 2  . metFORMIN (GLUCOPHAGE) 1000 MG tablet Take 1 tablet by mouth twice daily 180 tablet 2  . metoprolol succinate (TOPROL-XL) 25 MG 24 hr tablet Take 1 tablet by mouth once daily 90 tablet 2  . nitroGLYCERIN (NITROSTAT) 0.4 MG SL tablet Place 1 tablet (0.4 mg total) under the tongue every 5 (five) minutes as needed for chest pain. 25 tablet 6  . ONETOUCH VERIO test strip     . tiZANidine (ZANAFLEX) 2 MG tablet Take 2 mg by mouth at bedtime. (Patient not taking: Reported on 08/29/2020)    . vitamin C (VITAMIN C) 500 MG tablet Take 1 tablet (500 mg total) by mouth 2 (two) times daily. (Patient taking differently: Take 500 mg by mouth daily.) 60 tablet 2  . zolpidem (AMBIEN) 5 MG tablet Take 1 tablet (5 mg total) by mouth at bedtime as needed for sleep. 30 tablet 1   No current facility-administered medications on file prior to visit.   Past Medical History:  Diagnosis Date  . Angina pectoris (HCC) 12/26/2015  . CAD (coronary artery disease)   . Chip fracture of triquetral bone of right wrist, closed, initial encounter 12/09/2016  . Chronic back pain   .  Contracture of tendon sheath 08/20/2016  . 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 Diabetes mellitus due to underlying condition with unspecified complications (HCC) 12/26/2015  . Diabetic polyneuropathy associated with type 2 diabetes mellitus (HCC) 08/20/2016  . Discitis   . Essential hypertension 12/26/2015  . Fracture, tibial plateau 12/18/2014  . Hypertension   . 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 Metatarsalgia of both feet 08/20/2016  . MI (myocardial infarction) (HCC)    x 2,  Multi-link Vision 3.5 x 15 mm BMS in 2009 (mid LAD), balloon 2015  . Mixed hyperlipidemia 06/14/2020  . Obesity (BMI 30.0-34.9) 06/26/2020  . Tibial plateau fracture 12/18/2014  . Vitamin D insufficiency    Past Surgical History:  Procedure Laterality Date  . BACK SURGERY     2014  . CARDIAC CATHETERIZATION     x 2, 2009 & 2015  . COLONOSCOPY    . CORONARY ANGIOPLASTY     stent - 2008, balloon - 2015  . FASCIOTOMY Right 12/19/2014   Procedure: ANTERIOR COMPARTMENTAL FASCIOTOMY;  Surgeon: Myrene Galas, MD;  Location: [facility] OR;  Service: Orthopedics;  Laterality: Right;  . HAND SURGERY Left   . HARDWARE REMOVAL Right 07/24/2015   Procedure: HARDWARE REMOVAL RIGHT TIBIA;  Surgeon: Myrene Galas, MD;  Location: [facility] OR;  Service: Orthopedics;  Laterality: Right;  . KNEE ARTHROSCOPY Right 07/24/2015   Procedure: RIGHT ARTHROSCOPY KNEE WITH PARTIAL SYNOVECTOMY AND MEDIAL MENISCECTOMY;  Surgeon: Myrene Galas, MD;  Location: [facility] OR;  Service: Orthopedics;  Laterality: Right;  . LEFT HEART CATH AND CORONARY ANGIOGRAPHY N/A 07/02/2020   Procedure: LEFT HEART CATH AND CORONARY ANGIOGRAPHY;  Surgeon: Lennette Bihari, MD;  Location: [facility] INVASIVE CV LAB;  Service: Cardiovascular;  Laterality: N/A;  . ORIF TIBIA PLATEAU Right 12/19/2014   Procedure: OPEN REDUCTION INTERNAL FIXATION (ORIF) RIGHT TIBIAL PLATEAU;  Surgeon: Myrene Galas, MD;  Location: [facility] OR;  Service: Orthopedics;  Laterality: Right;  . TONSILLECTOMY    . VASECTOMY      Family History  Problem Relation Age of Onset  . Diabetes Mother   . CAD Mother   . Liver disease Father    Social History   Socioeconomic History  . Marital status: Married    Spouse name: Not on file  . Number of children: 2  . Years of education: Not on file  . Highest education level: Not on file  Occupational History  . Occupation: disabled  Tobacco Use  . Smoking status: Former Smoker    Packs/day: 1.00    Types: Cigarettes    Quit date: 02/20/2007    Years since  quitting: 13.5  . Smokeless tobacco: Never Used  Vaping Use  . Vaping Use: Never used  Substance and Sexual Activity  . Alcohol use: No    Alcohol/week: 0.0 standard drinks    Comment: None in years  . Drug use: No  . Sexual activity: Yes    Partners: Female  Other Topics Concern  . Not on file  Social History Narrative   Pt lives with wife and daughter in [place].    Social Determinants of Health   Financial Resource Strain: Not on file  Food Insecurity: No Food Insecurity  . Worried About Programme researcher, broadcasting/film/video in the Last Year: Never true  . Ran Out of Food in the Last Year: Never true  Transportation Needs: No Transportation Needs  . Lack of Transportation (Medical): No  . Lack  of Transportation (Non-Medical): No  Physical Activity: Not on file  Stress: Not on file  Social Connections: Not on file    Review of Systems  Constitutional: Negative for activity change and appetite change.  HENT: Negative for congestion and rhinorrhea.   Eyes: Positive for visual disturbance.  Respiratory: Negative for chest tightness and shortness of breath.   Cardiovascular: Negative for chest pain, palpitations and leg swelling.  Gastrointestinal: Negative for abdominal distention and abdominal pain.  Genitourinary: Negative for difficulty urinating and dysuria.  Musculoskeletal: Positive for back pain. Negative for arthralgias (knees).  Neurological: Negative.   Psychiatric/Behavioral: Negative.      Objective:  BP 130/60   Pulse 70   Temp 97.8 F (36.6 C)   Resp 16   Ht 5\' 5"  (1.651 m)   Wt 195 lb (88.5 kg)   SpO2 96%   BMI 32.45 kg/m   BP/Weight 08/29/2020 07/24/2020 07/02/2020  Systolic BP 130 130 115  Diastolic BP 60 60 69  Wt. (Lbs) 195 198.4 196  BMI 32.45 33.02 32.62    Physical Exam Vitals reviewed.  Constitutional:      Appearance: Normal appearance.  HENT:     Right Ear: Tympanic membrane normal.     Left Ear: Tympanic membrane normal.     Nose: Nose normal.      Mouth/Throat:     Mouth: Mucous membranes are moist.     Pharynx: Oropharynx is clear.  Eyes:     Extraocular Movements: Extraocular movements intact.     Conjunctiva/sclera: Conjunctivae normal.     Pupils: Pupils are equal, round, and reactive to light.  Cardiovascular:     Rate and Rhythm: Normal rate.     Pulses: Normal pulses.     Heart sounds: Normal heart sounds. No murmur heard. No gallop.   Pulmonary:     Effort: Pulmonary effort is normal. No respiratory distress.     Breath sounds: Normal breath sounds. No rales.  Abdominal:     General: Abdomen is flat. Bowel sounds are normal. There is no distension.     Palpations: Abdomen is soft.  Musculoskeletal:        General: Tenderness present.     Cervical back: Tenderness present.     Comments: Arthritis knees r>l trouble walking distances  Skin:    General: Skin is warm.     Capillary Refill: Capillary refill takes less than 2 seconds.  Neurological:     General: No focal deficit present.     Mental Status: He is oriented to person, place, and time. Mental status is at baseline.     Sensory: Sensory deficit (feet) present.     Motor: Weakness present.     Diabetic Foot Exam - Simple   Simple Foot Form Diabetic Foot exam was performed with the following findings: Yes 08/29/2020 10:46 AM  Visual Inspection No deformities, no ulcerations, no other skin breakdown bilaterally: Yes Sensation Testing See comments: Yes Pulse Check Posterior Tibialis and Dorsalis pulse intact bilaterally: Yes Comments decreased sensation feet      Lab Results  Component Value Date   WBC 6.6 06/26/2020   HGB 15.1 06/26/2020   HCT 44.4 06/26/2020   PLT 197 06/26/2020   GLUCOSE 162 (H) 06/26/2020   CHOL 151 06/14/2020   TRIG 92 06/14/2020   HDL 38 (L) 06/14/2020   LDLCALC 96 06/14/2020   ALT 97 (H) 06/14/2020   AST 61 (H) 06/14/2020   NA 139 06/26/2020   K 5.0  06/26/2020   CL 102 06/26/2020   CREATININE 1.20 06/26/2020    BUN 18 06/26/2020   CO2 25 06/26/2020   TSH 2.829 12/18/2014   INR 1.20 12/18/2014   HGBA1C 9.7 (H) 06/14/2020   MICROALBUR 80 06/14/2020      Assessment & Plan:   1. Diabetic polyneuropathy associated with type 2 diabetes mellitus (HCC) - insulin degludec (TRESIBA FLEXTOUCH) 100 UNIT/ML FlexTouch Pen; Inject 40 Units into the skin daily.  Dispense: 15 mL; Refill: 3 - Semaglutide,0.25 or 0.5MG /DOS, (OZEMPIC, 0.25 OR 0.5 MG/DOSE,) 2 MG/1.5ML SOPN; Inject 0.5 mg into the skin once a week.  Dispense: 3 mL; Refill: 6 An individual care plan for diabetes was established and reinforced today.  The patient's status was assessed using clinical findings on exam, labs and diagnostic testing. Patient success at meeting goals based on disease specific evidence-based guidelines and found to be fair only controlled. Started neew insulin and ozempic Medications were assessed and patient's understanding of the medical issues , including barriers were assessed. Recommend adherence to a diabetic diet, a graduated exercise program, HgbA1c level is checked quarterly, and urine microalbumin performed yearly .  Annual mono-filament sensation testing performed. Lower blood pressure and control hyperlipidemia is important. Get annual eye exams and annual flu shots and smoking cessation discussed.  Self management goals were discussed.  2. Essential hypertension An individual hypertension care plan was established and reinforced today.  The patient's status was assessed using clinical findings on exam and labs or diagnostic tests. The patient's success at meeting treatment goals on disease specific evidence-based guidelines and found to be well controlled. SELF MANAGEMENT: The patient and I together assessed ways to personally work towards obtaining the recommended goals. RECOMMENDATIONS: avoid decongestants found in common cold remedies, decrease consumption of alcohol, perform routine monitoring of BP with home BP  cuff, exercise, reduction of dietary salt, take medicines as prescribed, try not to miss doses and quit smoking.  Regular exercise and maintaining a healthy weight is needed.  Stress reduction may help. A CLINICAL SUMMARY including written plan identify barriers to care unique to individual due to social or financial issues.  We attempt to mutually creat solutions for individual and family understanding.  3. Diabetes mellitus due to underlying condition with unspecified complications [facility]) An individual care plan for diabetes was established and reinforced today.  The patient's status was assessed using clinical findings on exam, labs and diagnostic testing. Patient success at meeting goals based on disease specific evidence-based guidelines and found to be fair controlled. Medications were assessed and patient's understanding of the medical issues , including barriers were assessed. Recommend adherence to a diabetic diet, a graduated exercise program, HgbA1c level is checked quarterly, and urine microalbumin performed yearly .  Annual mono-filament sensation testing performed. Lower blood pressure and control hyperlipidemia is important. Get annual eye exams and annual flu shots and smoking cessation discussed.  Self management goals were discussed.  4. Mixed hyperlipidemia AN INDIVIDUAL CARE PLAN for hyperlipidemia/ cholesterol was established and reinforced today.  The patient's status was assessed using clinical findings on exam, lab and other diagnostic tests. The patient's disease status was assessed based on evidence-based guidelines and found to be fair controlled. MEDICATIONS were reviewed. SELF MANAGEMENT GOALS have been discussed and patient's success at attaining the goal of low cholesterol was assessed. RECOMMENDATION given include regular exercise 3 days a week and low cholesterol/low fat diet. CLINICAL SUMMARY including written plan to identify barriers unique to the patient due to social  or economic  reasons was discussed.    Meds ordered this encounter  Medications  . insulin degludec (TRESIBA FLEXTOUCH) 100 UNIT/ML FlexTouch Pen    Sig: Inject 40 Units into the skin daily.    Dispense:  15 mL    Refill:  3  . Semaglutide,0.25 or 0.5MG /DOS, (OZEMPIC, 0.25 OR 0.5 MG/DOSE,) 2 MG/1.5ML SOPN    Sig: Inject 0.5 mg into the skin once a week.    Dispense:  3 mL    Refill:  6  . mupirocin ointment (BACTROBAN) 2 %    Sig: Apply topically 2 (two) times daily.    Dispense:  22 g    Refill:  6       Follow-up: Return in about 3 months (around 11/28/2020) for diabetes.  An After Visit Summary was printed and given to the patient.  Brent BullaLawrence Tocarra Gassen, MD [facility] [phone]

## 2020-08-30 ENCOUNTER — Other Ambulatory Visit: Payer: Self-pay

## 2020-08-30 ENCOUNTER — Other Ambulatory Visit: Payer: Self-pay | Admitting: Legal Medicine

## 2020-08-30 DIAGNOSIS — E1142 Type 2 diabetes mellitus with diabetic polyneuropathy: Secondary | ICD-10-CM

## 2020-08-30 LAB — COMPREHENSIVE METABOLIC PANEL
ALT: 92 IU/L — ABNORMAL HIGH (ref 0–44)
AST: 86 IU/L — ABNORMAL HIGH (ref 0–40)
Albumin/Globulin Ratio: 2 (ref 1.2–2.2)
Albumin: 4.7 g/dL (ref 3.8–4.8)
Alkaline Phosphatase: 77 IU/L (ref 44–121)
BUN/Creatinine Ratio: 13 (ref 10–24)
BUN: 14 mg/dL (ref 8–27)
Bilirubin Total: 0.7 mg/dL (ref 0.0–1.2)
CO2: 20 mmol/L (ref 20–29)
Calcium: 9.5 mg/dL (ref 8.6–10.2)
Chloride: 107 mmol/L — ABNORMAL HIGH (ref 96–106)
Creatinine, Ser: 1.07 mg/dL (ref 0.76–1.27)
Globulin, Total: 2.4 g/dL (ref 1.5–4.5)
Glucose: 125 mg/dL — ABNORMAL HIGH (ref 65–99)
Potassium: 4.9 mmol/L (ref 3.5–5.2)
Sodium: 144 mmol/L (ref 134–144)
Total Protein: 7.1 g/dL (ref 6.0–8.5)
eGFR: 77 mL/min/{1.73_m2} (ref >59)

## 2020-08-30 LAB — CBC WITH DIFFERENTIAL/PLATELET
Basophils Absolute: 0 10*3/uL (ref 0.0–0.2)
Basos: 1 %
EOS (ABSOLUTE): 0.1 10*3/uL (ref 0.0–0.4)
Eos: 1 %
Hematocrit: 47.8 % (ref 37.5–51.0)
Hemoglobin: 15.9 g/dL (ref 13.0–17.7)
Immature Grans (Abs): 0 10*3/uL (ref 0.0–0.1)
Immature Granulocytes: 0 %
Lymphocytes Absolute: 1.2 10*3/uL (ref 0.7–3.1)
Lymphs: 19 %
MCH: 27.9 pg (ref 26.6–33.0)
MCHC: 33.3 g/dL (ref 31.5–35.7)
MCV: 84 fL (ref 79–97)
Monocytes Absolute: 0.5 10*3/uL (ref 0.1–0.9)
Monocytes: 8 %
Neutrophils Absolute: 4.4 10*3/uL (ref 1.4–7.0)
Neutrophils: 71 %
Platelets: 197 10*3/uL (ref 150–450)
RBC: 5.69 x10E6/uL (ref 4.14–5.80)
RDW: 13.3 % (ref 11.6–15.4)
WBC: 6.2 10*3/uL (ref 3.4–10.8)

## 2020-08-30 LAB — LIPID PANEL
Chol/HDL Ratio: 4.3 ratio (ref 0.0–5.0)
Cholesterol, Total: 167 mg/dL (ref 100–199)
HDL: 39 mg/dL — ABNORMAL LOW (ref >39)
LDL Chol Calc (NIH): 111 mg/dL — ABNORMAL HIGH (ref 0–99)
Triglycerides: 88 mg/dL (ref 0–149)
VLDL Cholesterol Cal: 17 mg/dL (ref 5–40)

## 2020-08-30 LAB — HEMOGLOBIN A1C
Est. average glucose Bld gHb Est-mCnc: 209 mg/dL
Hgb A1c MFr Bld: 8.9 % — ABNORMAL HIGH (ref 4.8–5.6)

## 2020-08-30 LAB — CARDIOVASCULAR RISK ASSESSMENT

## 2020-08-30 MED ORDER — FREESTYLE LIBRE 14 DAY SENSOR MISC
1.0000 | 3 refills | Status: DC
Start: 2020-08-30 — End: 2020-09-03

## 2020-08-30 NOTE — Progress Notes (Unsigned)
Pt currently wearing sample libre. Needs libre sensors sent to [facility] for continuance.

## 2020-08-30 NOTE — Progress Notes (Signed)
Cbc normal, glucose 125, kidney tests normal, liver tests remain elevated, consider ultrasound liver, a1c 8.9 very high, LDL cholesterol high at 111, use  your new medicines lp

## 2020-08-31 ENCOUNTER — Telehealth: Payer: Self-pay

## 2020-08-31 NOTE — Progress Notes (Signed)
Patient called with questions regarding onboarding and receiving his prescriptions. He stated that he is very pleased that he will be receiving medications through [facility]. He stated that [facility]PP started him on a sample of [facility] last week and his sugars have been much more regulated. He stated that he was experiencing erratic numbers ranging from 330 to 120 prior to the [facility]. He stated that over the weekend that his sugar was averaging around 114 after eating. He reported that it was 140 after eating breakfast around 10 am this morning, but it went back down to 120 shortly after that. Patient stated that he has noticed fewer sugar cravings since his sugars are "not all over the place". He will call us back with any further questions or concerns.     Josiah Lobo, CMA  [phone] Clinical Pharmacist Assistant

## 2020-09-03 ENCOUNTER — Other Ambulatory Visit: Payer: Self-pay

## 2020-09-03 DIAGNOSIS — E1142 Type 2 diabetes mellitus with diabetic polyneuropathy: Secondary | ICD-10-CM

## 2020-09-03 DIAGNOSIS — R7989 Other specified abnormal findings of blood chemistry: Secondary | ICD-10-CM

## 2020-09-03 MED ORDER — FREESTYLE LIBRE 14 DAY SENSOR MISC: 1.0000 | 3 refills | Status: DC

## 2020-09-03 MED ORDER — ZOLPIDEM TARTRATE 5 MG PO TABS: 5.0000 mg | ORAL_TABLET | Freq: Every evening | ORAL | 3 refills | Status: DC | PRN

## 2020-09-03 NOTE — Progress Notes (Signed)
Set up liver ultrasound lp

## 2020-09-06 DIAGNOSIS — R062 Wheezing: Secondary | ICD-10-CM | POA: Diagnosis not present

## 2020-09-06 DIAGNOSIS — J988 Other specified respiratory disorders: Secondary | ICD-10-CM | POA: Diagnosis not present

## 2020-09-07 ENCOUNTER — Telehealth: Payer: Self-pay | Admitting: Legal Medicine

## 2020-09-07 NOTE — Telephone Encounter (Signed)
   Mitchell Hammond has been scheduled for the following appointment:  WHAT: ultrasound abdomen RUQ WHERE: [facility] DATE: 09/11/20 TIME: arrive at 7:30 for 8:00 appt.  Patient has been made aware.

## 2020-09-11 DIAGNOSIS — R945 Abnormal results of liver function studies: Secondary | ICD-10-CM | POA: Diagnosis not present

## 2020-09-11 DIAGNOSIS — R7989 Other specified abnormal findings of blood chemistry: Secondary | ICD-10-CM | POA: Diagnosis not present

## 2020-09-11 DIAGNOSIS — K824 Cholesterolosis of gallbladder: Secondary | ICD-10-CM | POA: Diagnosis not present

## 2020-09-13 ENCOUNTER — Telehealth: Payer: Self-pay

## 2020-09-13 NOTE — Progress Notes (Signed)
Patient called inquiring about one of his medications being delivered from [facility]. He stated that he is being charged a copay of $1.82, and all of his medications are supposed to have zero copay. I checked to see which rx he was speaking of and it was for Zolpidem Tartrate 5 mg. Tablet #30, for 30DS.   Patient also stated that only 2 testing strips came with his Freestyle rx. Each strip should last 2 weeks. Patient stated he had to take the first one off because it would not stay on his skin after two days. He reported that he now has the second strip on and placed it further up under his arm and it seems to be staying there a lot better. He requested to have [facility] send more strips, but is concerned that from an insurance standpoint that he is not going to be eligible for more. I communicated this with Juliane Lack, [facility]  for her to advise me on how to proceed with both of these concerns.    Per Juliane Lack, [facility], She advised me to have patient call [facility] customer service to let them know what happened. I provided phone number to patient and he stated that he will call them today. Patient stated that he is likely being charged a copay for Zolpidem since it is not a chronic care medication. I contacted Thornell Mule in at [facility] pharmacy and she was unclear why there was a charge and suggested that patient call his insurance for further clarification. I called patient with this information and he was pleased with outcome. He will call if he has further questions or concerns.    Josiah Lobo, CMA  [phone] Clinical Pharmacist Assistant

## 2020-09-21 NOTE — Progress Notes (Signed)
Chronic Care Management Pharmacy Note  09/25/2020 Name:  Mitchell Hammond MRN:  [id number] DOB:  [birth year]   Plan Updates:   Patient is willing to begin Ozempic  0.5 mg x4 weeks with option to increase to 1 mg weekly if well tolerated. Recommend beginning Tresiba 40 units daily with close follow-up on blood sugar readings to adjust as needed.   Patient coordinated refill of Libre 2 delivered.   Subjective: Mitchell Hammond is an 65 y.o. year old male who is a primary patient of Henrene Pastor, Zeb Comfort, MD.  The CCM team was consulted for assistance with disease management and care coordination needs.    Engaged with patient face to face for initial visit in response to provider referral for pharmacy case management and/or care coordination services.   Consent to Services:  The patient was given the following information about Chronic Care Management services today, agreed to services, and gave verbal consent: 1. CCM service includes personalized support from designated clinical staff supervised by the primary care provider, including individualized plan of care and coordination with other care providers 2. 24/7 contact phone numbers for assistance for urgent and routine care needs. 3. Service will only be billed when office clinical staff spend 20 minutes or more in a month to coordinate care. 4. Only one practitioner may furnish and bill the service in a calendar month. 5.The patient may stop CCM services at any time (effective at the end of the month) by phone call to the office staff. 6. The patient will be responsible for cost sharing (co-pay) of up to 20% of the service fee (after annual deductible is met). Patient agreed to services and consent obtained.  Patient Care Team: Lillard Anes, MD as PCP - General (Family Medicine) Burnice Logan, [facility] as Pharmacist (Pharmacist)  Recent office visits: 08/29/2020 - Cbc normal, glucose 125, kidney tests normal, liver tests remain elevated,  consider ultrasound liver, a1c 8.9 very high, LDL cholesterol high at 111, use  your new medicines 07/24/2020 - vascular ultrasound ordered.  06/14/2020 - CBC normal, glucose 173, kidney tests normal, liver tests elevated- we will follow, A1c 9.7, stop glipizide, nurse visit to start ozempic or trulicity, Cholesterol good Recent consult visits: 09/06/2020 - Urgent care for respiratory infection. Steroid and doxycycline.  07/02/2020 - heart catherization  06/26/2020 - cardio - CAD - symptoms are concerning. Ordered left heart catherization. Encourage weight loss and improved blood sugar.  04/23/2020 - cough/congestion - tessalon, mucinex and loratadine.  04/09/2020 - urgent care - knee and back pain - kenalog shot.   Hospital visits: None in previous 6 months  Objective:  Lab Results  Component Value Date   CREATININE 1.07 08/29/2020   BUN 14 08/29/2020   GFRNONAA 63 06/26/2020   GFRAA 72 06/26/2020   NA 144 08/29/2020   K 4.9 08/29/2020   CALCIUM 9.5 08/29/2020   CO2 20 08/29/2020   GLUCOSE 125 (H) 08/29/2020    Lab Results  Component Value Date/Time   HGBA1C 8.9 (H) 08/29/2020 10:59 AM   HGBA1C 9.7 (H) 06/14/2020 08:35 AM   MICROALBUR 80 06/14/2020 09:41 AM    Last diabetic Eye exam: No results found for: HMDIABEYEEXA  Last diabetic Foot exam: No results found for: HMDIABFOOTEX   Lab Results  Component Value Date   CHOL 167 08/29/2020   HDL 39 (L) 08/29/2020   LDLCALC 111 (H) 08/29/2020   TRIG 88 08/29/2020   CHOLHDL 4.3 08/29/2020    Hepatic Function Latest Ref Rng &  Units 08/29/2020 06/14/2020 07/20/2019  Total Protein 6.0 - 8.5 g/dL 7.1 6.7 6.2  Albumin 3.8 - 4.8 g/dL 4.7 4.5 4.2  AST 0 - 40 IU/L 86(H) 61(H) 28  ALT 0 - 44 IU/L 92(H) 97(H) 37  Alk Phosphatase 44 - 121 IU/L 77 87 82  Total Bilirubin 0.0 - 1.2 mg/dL 0.7 0.7 0.4    Lab Results  Component Value Date/Time   TSH 2.829 12/18/2014 11:46 AM    CBC Latest Ref Rng & Units 08/29/2020 06/26/2020 06/14/2020   WBC 3.4 - 10.8 x10E3/uL 6.2 6.6 7.0  Hemoglobin 13.0 - 17.7 g/dL 15.9 15.1 15.6  Hematocrit 37.5 - 51.0 % 47.8 44.4 45.9  Platelets 150 - 450 x10E3/uL 197 197 190    Lab Results  Component Value Date/Time   VD25OH 22.8 (L) 12/18/2014 11:46 AM    Clinical ASCVD: Yes  The ASCVD Risk score Mikey Bussing DC Jr., et al., 2013) failed to calculate for the following reasons:   The patient has a prior MI or stroke diagnosis    Depression screen [facility] 2/9 07/24/2020 07/20/2019  Decreased Interest 2 0  Down, Depressed, Hopeless 1 0  PHQ - 2 Score 3 0  Altered sleeping 3 -  Tired, decreased energy 1 -  Change in appetite 0 -  Feeling bad or failure about yourself  0 -  Trouble concentrating 1 -  Moving slowly or fidgety/restless 0 -  Suicidal thoughts 0 -  PHQ-9 Score 8 -  Difficult doing work/chores Somewhat difficult -     Social History   Tobacco Use  Smoking Status Former Smoker  . Packs/day: 1.00  . Types: Cigarettes  . Quit date: 02/20/2007  . Years since quitting: 13.6  Smokeless Tobacco Never Used   BP Readings from Last 3 Encounters:  08/29/20 130/60  07/24/20 130/60  07/02/20 115/69   Pulse Readings from Last 3 Encounters:  08/29/20 70  07/24/20 62  07/02/20 68   Wt Readings from Last 3 Encounters:  08/29/20 195 lb (88.5 kg)  07/24/20 198 lb 6.4 oz (90 kg)  07/02/20 196 lb (88.9 kg)   BMI Readings from Last 3 Encounters:  08/29/20 32.45 kg/m  07/24/20 33.02 kg/m  07/02/20 32.62 kg/m    Assessment/Interventions: Review of patient past medical history, allergies, medications, health status, including review of consultants reports, laboratory and other test data, was performed as part of comprehensive evaluation and provision of chronic care management services.   SDOH:  (Social Determinants of Health) assessments and interventions performed: Yes  SDOH Screenings   Alcohol Screen: Not on file  Depression (PHQ2-9): Medium Risk  . PHQ-2 Score: 8  Financial  Resource Strain: Not on file  Food Insecurity: No Food Insecurity  . Worried About Charity fundraiser in the Last Year: Never true  . Ran Out of Food in the Last Year: Never true  Housing: Low Risk   . Last Housing Risk Score: 0  Physical Activity: Not on file  Social Connections: Not on file  Stress: Not on file  Tobacco Use: Medium Risk  . Smoking Tobacco Use: Former Smoker  . Smokeless Tobacco Use: Never Used  Transportation Needs: No Transportation Needs  . Lack of Transportation (Medical): No  . Lack of Transportation (Non-Medical): No    CCM Care Plan  No Known Allergies  Medications Reviewed Today    Reviewed by Burnice Logan, [facility] (Pharmacist) on 09/25/20 at 1523  Med List Status: <None>  Medication Order Taking? Sig Documenting  Provider Last Dose Status Informant  aspirin EC 81 MG tablet [id number] Yes Take 81 mg by mouth daily. [provider] Taking Active Self  atorvastatin (LIPITOR) 40 MG tablet [id number] Yes Take 1 tablet by mouth once daily  Patient taking differently: Take 40 mg by mouth daily.   Lillard Anes, MD Taking Active   cholecalciferol (VITAMIN D3) 25 MCG (1000 UNIT) tablet [id number] Yes Take 1,000 Units by mouth daily. [provider] Taking Active   Continuous Blood Gluc Sensor (FREESTYLE LIBRE [place]) [place] [id number] Yes 1 each by Does not apply route every 14 (fourteen) days. Lillard Anes, MD Taking Active   fenofibrate (TRICOR) 145 MG tablet [id number] Yes TAKE 1 TABLET(145 MG) BY MOUTH DAILY  Patient taking differently: Take 145 mg by mouth daily.   Lillard Anes, MD Taking Active Self  gabapentin (NEURONTIN) 600 MG tablet [id number] Yes Take 1 tablet (600 mg total) by mouth in the morning, at noon, in the evening, and at bedtime. Lillard Anes, MD Taking Active Self  glucose monitoring kit (FREESTYLE) monitoring kit [id number] No 1 each by Does not apply route as needed.  Patient not taking:  Reported on 09/25/2020   [provider] Not Taking Active   insulin degludec (TRESIBA FLEXTOUCH) 100 UNIT/ML FlexTouch Pen [id number] Yes Inject 40 Units into the skin daily. Lillard Anes, MD Taking Active   Lancet Devices ([place] LANCING DEV) [place] [id number] Yes 1 each by Does not apply route daily. Lillard Anes, MD Taking Active   metFORMIN (GLUCOPHAGE) 1000 MG tablet [id number] Yes Take 1 tablet by mouth twice daily Lillard Anes, MD Taking Active   metoprolol succinate (TOPROL-XL) 25 MG 24 hr tablet [id number] Yes Take 1 tablet (25 mg total) by mouth daily. Lillard Anes, MD Taking Active   mupirocin ointment [facility]) 2 % [id number] No Apply topically 2 (two) times daily.  Patient not taking: Reported on 09/25/2020   Lillard Anes, MD Not Taking Active   nitroGLYCERIN (NITROSTAT) 0.4 MG SL tablet [id number] Yes Place 1 tablet (0.4 mg total) under the tongue every 5 (five) minutes as needed for chest pain. Lillard Anes, MD Taking Active Self  OneTouch Delica Lancets 83F [place] [id number] Yes 1 each by Does not apply route daily. Lillard Anes, MD Taking Active   [facility] VERIO test strip [id number] Yes Check blood sugar level every day before Breakfast. Lillard Anes, MD Taking Active   Semaglutide,0.25 or 0.5MG/DOS, (OZEMPIC, 0.25 OR 0.5 MG/DOSE,) 2 MG/1.5ML SOPN [id number] Yes Inject 0.5 mg into the skin once a week. Lillard Anes, MD Taking Active   tiZANidine (ZANAFLEX) 2 MG tablet [id number] No Take 2 mg by mouth at bedtime.  Patient not taking: No sig reported   [provider] Not Taking Active   vitamin C (VITAMIN C) 500 MG tablet [id number] Yes Take 1 tablet (500 mg total) by mouth 2 (two) times daily.  Patient taking differently: Take 500 mg by mouth daily.   Ainsley Spinner, PA-C Taking Active   zolpidem (AMBIEN) 5 MG tablet [id number] Yes Take 1 tablet (5 mg total) by mouth at bedtime as  needed for sleep. Lillard Anes, MD Taking Active           Patient Active Problem List   Diagnosis Date Noted  . Hypertension   . Obesity (BMI 30.0-34.9) 06/26/2020  . Discitis   . MI (myocardial infarction) ([place])   . Mixed hyperlipidemia  06/14/2020  . 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 Chip fracture of triquetral bone of right wrist, closed, initial encounter 12/09/2016  . Contracture of tendon sheath 08/20/2016  . Diabetic polyneuropathy associated with type 2 diabetes mellitus ([place]) 08/20/2016  . Metatarsalgia of both feet 08/20/2016  . Essential hypertension 12/26/2015  . Diabetes mellitus due to underlying condition with unspecified complications ([place]) 94/17/4081  . Angina pectoris ([place]) 12/26/2015  . Vitamin D insufficiency 12/20/2014  . CAD (coronary artery disease)   . Chronic back pain   . Tibial plateau fracture 12/18/2014  . Fracture, tibial plateau 12/18/2014    Immunization History  Administered Date(s) Administered  . Fluad Quad(high Dose 65+) 06/14/2020  . Moderna Sars-Covid-2 Vaccination 09/19/2019, 10/17/2019, 05/07/2020  . Pneumococcal Polysaccharide-23 06/14/2020    Conditions to be addressed/monitored:  Hypertension, Hyperlipidemia, Diabetes, Coronary Artery Disease and Chronic pain, Vitamin D insufficiency  Care Plan : CCM Pharmacy Care Plan  Updates made by Burnice Logan, [place] since 09/25/2020 12:00 AM    Problem: CAD, HTN, HLD, DM   Priority: High  Onset Date: 08/29/2020    Long-Range Goal: Disease State Management   Start Date: 08/29/2020  Expected End Date: 08/29/2021  Recent Progress: On track  Priority: High  Note:     Current Barriers:  . Unable to independently afford treatment regimen . Unable to achieve control of diabetes   Pharmacist Clinical Goal(s):  Marland Kitchen Patient will verbalize ability to afford treatment regimen . achieve control of diabetes as evidenced by blood sugar readings and a1c through  collaboration with PharmD and provider.   Interventions: . 1:1 collaboration with Lillard Anes, MD regarding development and update of comprehensive plan of care as evidenced by provider attestation and co-signature . Inter-disciplinary care team collaboration (see longitudinal plan of care) . Comprehensive medication review performed; medication list updated in electronic medical record  Hypertension (BP goal <130/80) -Controlled -Current treatment: . metoprolol succinate 25 mg once daily  -Medications previously tried: none reported  -Current home readings: not checking -Current dietary habits: light breakfast of cereal or fruit and fast food dinner. Eats nuts occasionally for snack.  -Current exercise habits: stays active but no formal exercise due to leg/back pain.  -Denies hypotensive/hypertensive symptoms -Educated on BP goals and benefits of medications for prevention of heart attack, stroke and kidney damage; Daily salt intake goal < 2300 mg; Exercise goal of 150 minutes per week; Importance of home blood pressure monitoring; -Counseled to monitor BP at home weekly, document, and provide log at future appointments -Counseled on diet and exercise extensively Recommended to continue current medication Counseled on benefits of pool exercise.   Hyperlipidemia: (LDL goal < 55) -Not ideally controlled -Current treatment: . aspirin ec 81 mg daily  . Atorvastatin 40 mg daily  . Fenofibrate 145 mg daily  . nitrostat prn  -Medications previously tried: none reported  -Current dietary patterns: fast food for dinner and fruit/cereal for breakfast. Eats 2 main meals a day. Eats nuts for snacks.  -Current exercise habits: encouraged water aerobics due to back/leg pain.  -Educated on Cholesterol goals;  Benefits of statin for ASCVD risk reduction; Importance of limiting foods high in cholesterol; Exercise goal of 150 minutes per week; -Counseled on diet and exercise  extensively Recommended to continue current medication Recommended packaging/delivery for medications. Will review future labs to determine if dose increase is needed.   Diabetes (A1c goal <7%) -Uncontrolled -Current medications: . metformin 1000 mg bid  . Ozempic  0.25 mg weekly . Tresiba 40 units daily  -Medications previously tried: ozempic, januvia, glipizide  -Current home glucose readings . fasting glucose: 120, 130, 170, 220 mg/dL . post prandial glucose: 195-220 mg/dL -Denies hypoglycemic/hyperglycemic symptoms -Current meal patterns:  . breakfast: cereal or banana with coffee  . lunch: typically doesn't eat  . dinner: fast food - fried chicken with sides mostly . snacks: nuts  . drinks: water, sugar free soft drink, coffee, water with sugar free packet -Current exercise: limited due to back/leg pain. Does stay active around the house.  -Educated on A1c and blood sugar goals; Complications of diabetes including kidney damage, retinal damage, and cardiovascular disease; Exercise goal of 150 minutes per week; Benefits of weight loss; Benefits of routine self-monitoring of blood sugar; Continuous glucose monitoring; -Counseled to check feet daily and get yearly eye exams -Counseled on diet and exercise extensively Recommended Tresiba 40 units daily and Ozempic 0.5 mg weekly for 4 weeks and consider increasing to 1 mg thereafter.  Educated on [employer] 2 and benefits. Provided patient with a sample and applied [place] 2 sensor in office.   Neuropathy/Pain (Goal: minimize symptoms of pain/neuropathy) -Not ideally controlled -Current treatment  . Gabapentin 600 mg qid -Medications previously tried: methocarbamol -Counseled on diet and exercise extensively Counseled on proper administration of gabapentin. Patient has been taking bid vs. qid. Advised patient to take QID.    Insomnia (Goal: improve sleep) -Controlled -Current treatment  . zolpidem 5 mg qhs prn   -Medications previously tried: none reported  -Counseled on diet and exercise extensively Recommended to continue current medication  Health Maintenance -Vaccine gaps: Recommended Shingrix, TDAP, fourth COVID vaccine  -Current therapy:  . vitamin c 500 mg bid . Vitamin D 1000 nits weekly -Educated on Herbal supplement research is limited and benefits usually cannot be proven -Patient is satisfied with current therapy and denies issues -Counseled on diet and exercise extensively Recommended to continue current medication   Patient Goals/Self-Care Activities . Patient will:  - take medications as prescribed focus on medication adherence by using pharmacy packaging/delivery check glucose with Freestyle Libre 2, document, and provide at future appointments engage in dietary modifications by limiting carbohydrates.   Follow Up Plan: Telephone follow up appointment with care management team member scheduled for: ~10/24/2020      Medication Assistance: None required.  Patient affirms current coverage meets needs. Patient has new coverage through [employer] that makes medications more affordable. Will contact pharmacist if any questions or concerns.   Patient's preferred pharmacy is:  [facility] - [place], [place] - [address] [place] [address] Phone: [phone] Fax: [phone]  Uses pill box? No - signing up for packaging Pt endorses fair compliance  We discussed: Benefits of medication synchronization, packaging and delivery as well as enhanced pharmacist oversight with [facility]. Patient decided to: Utilize [facility] for medication synchronization, packaging and delivery   Verbal consent obtained for [facility] enhanced pharmacy services (medication synchronization, adherence packaging, delivery coordination). A medication sync plan was created to allow patient to get all medications delivered once every  30 to 90 days per patient preference. Patient understands they have freedom to choose pharmacy and clinical pharmacist will coordinate care between all prescribers and [facility].   Care Plan and Follow Up Patient Decision:  Patient agrees to Care Plan and Follow-up.  Plan: Telephone follow up appointment with care management team member scheduled for:  10/2020

## 2020-09-24 ENCOUNTER — Ambulatory Visit: Payer: Medicare Other | Admitting: Legal Medicine

## 2020-09-25 ENCOUNTER — Telehealth: Payer: Medicare Other

## 2020-09-25 ENCOUNTER — Other Ambulatory Visit: Payer: Self-pay

## 2020-09-25 ENCOUNTER — Ambulatory Visit (INDEPENDENT_AMBULATORY_CARE_PROVIDER_SITE_OTHER): Payer: HMO

## 2020-09-25 DIAGNOSIS — I25118 Atherosclerotic heart disease of native coronary artery with other forms of angina pectoris: Secondary | ICD-10-CM | POA: Diagnosis not present

## 2020-09-25 DIAGNOSIS — E1142 Type 2 diabetes mellitus with diabetic polyneuropathy: Secondary | ICD-10-CM

## 2020-09-25 DIAGNOSIS — E088 Diabetes mellitus due to underlying condition with unspecified complications: Secondary | ICD-10-CM | POA: Diagnosis not present

## 2020-09-25 NOTE — Patient Instructions (Addendum)
Visit Information  Goals Addressed   None    Patient Care Plan: CCM Pharmacy Care Plan    Problem Identified: CAD, HTN, HLD, DM   Priority: High  Onset Date: 08/29/2020    Long-Range Goal: Disease State Management   Start Date: 08/29/2020  Expected End Date: 08/29/2021  Recent Progress: On track  Priority: High  Note:     Current Barriers:  . Unable to independently afford treatment regimen . Unable to achieve control of diabetes   Pharmacist Clinical Goal(s):  Marland Kitchen Patient will verbalize ability to afford treatment regimen . achieve control of diabetes as evidenced by blood sugar readings and a1c through collaboration with PharmD and provider.   Interventions: . 1:1 collaboration with Abigail Miyamoto, MD regarding development and update of comprehensive plan of care as evidenced by provider attestation and co-signature . Inter-disciplinary care team collaboration (see longitudinal plan of care) . Comprehensive medication review performed; medication list updated in electronic medical record  Hypertension (BP goal <130/80) -Controlled -Current treatment: . metoprolol succinate 25 mg once daily  -Medications previously tried: none reported  -Current home readings: not checking -Current dietary habits: light breakfast of cereal or fruit and fast food dinner. Eats nuts occasionally for snack.  -Current exercise habits: stays active but no formal exercise due to leg/back pain.  -Denies hypotensive/hypertensive symptoms -Educated on BP goals and benefits of medications for prevention of heart attack, stroke and kidney damage; Daily salt intake goal < 2300 mg; Exercise goal of 150 minutes per week; Importance of home blood pressure monitoring; -Counseled to monitor BP at home weekly, document, and provide log at future appointments -Counseled on diet and exercise extensively Recommended to continue current medication Counseled on benefits of pool exercise.    Hyperlipidemia: (LDL goal < 55) -Not ideally controlled -Current treatment: . aspirin ec 81 mg daily  . Atorvastatin 40 mg daily  . Fenofibrate 145 mg daily  . nitrostat prn  -Medications previously tried: none reported  -Current dietary patterns: fast food for dinner and fruit/cereal for breakfast. Eats 2 main meals a day. Eats nuts for snacks.  -Current exercise habits: encouraged water aerobics due to back/leg pain.  -Educated on Cholesterol goals;  Benefits of statin for ASCVD risk reduction; Importance of limiting foods high in cholesterol; Exercise goal of 150 minutes per week; -Counseled on diet and exercise extensively Recommended to continue current medication Recommended packaging/delivery for medications. Will review future labs to determine if dose increase is needed.   Diabetes (A1c goal <7%) -Uncontrolled -Current medications: . metformin 1000 mg bid  . Ozempic 0.25 mg weekly . Tresiba 40 units daily  -Medications previously tried: ozempic, januvia, glipizide  -Current home glucose readings . fasting glucose: 120, 130, 170, 220 mg/dL . post prandial glucose: 195-220 mg/dL -Denies hypoglycemic/hyperglycemic symptoms -Current meal patterns:  . breakfast: cereal or banana with coffee  . lunch: typically doesn't eat  . dinner: fast food - fried chicken with sides mostly . snacks: nuts  . drinks: water, sugar free soft drink, coffee, water with sugar free packet -Current exercise: limited due to back/leg pain. Does stay active around the house.  -Educated on A1c and blood sugar goals; Complications of diabetes including kidney damage, retinal damage, and cardiovascular disease; Exercise goal of 150 minutes per week; Benefits of weight loss; Benefits of routine self-monitoring of blood sugar; Continuous glucose monitoring; -Counseled to check feet daily and get yearly eye exams -Counseled on diet and exercise extensively Recommended Tresiba 40 units daily and  Ozempic 0.5 mg weekly for 4 weeks and consider increasing to 1 mg thereafter.  Educated on [employer] and benefits. Provided patient with a sample and applied [place] 2 sensor in office.   Neuropathy/Pain (Goal: minimize symptoms of pain/neuropathy) -Not ideally controlled -Current treatment  . Gabapentin 600 mg qid -Medications previously tried: methocarbamol -Counseled on diet and exercise extensively Counseled on proper administration of gabapentin. Patient has been taking bid vs. qid. Advised patient to take QID.    Insomnia (Goal: improve sleep) -Controlled -Current treatment  . zolpidem 5 mg qhs prn  -Medications previously tried: none reported  -Counseled on diet and exercise extensively Recommended to continue current medication  Health Maintenance -Vaccine gaps: Recommended Shingrix, TDAP, fourth COVID vaccine  -Current therapy:  . vitamin c 500 mg bid . Vitamin D 1000 nits weekly -Educated on Herbal supplement research is limited and benefits usually cannot be proven -Patient is satisfied with current therapy and denies issues -Counseled on diet and exercise extensively Recommended to continue current medication   Patient Goals/Self-Care Activities . Patient will:  - take medications as prescribed focus on medication adherence by using pharmacy packaging/delivery check glucose with Freestyle Libre 2, document, and provide at future appointments engage in dietary modifications by limiting carbohydrates.   Follow Up Plan: Telephone follow up appointment with care management team member scheduled for: ~10/24/2020      The patient verbalized understanding of instructions, educational materials, and care plan provided today and declined offer to receive copy of patient instructions, educational materials, and care plan.  Telephone follow up appointment with pharmacy team member scheduled for: 10/24/2020  Earvin Hansen,  [facility]  [email or web address]">  DASH Eating Plan DASH stands for Dietary Approaches to Stop Hypertension. The DASH eating plan is a healthy eating plan that has been shown to:  Reduce high blood pressure (hypertension).  Reduce your risk for type 2 diabetes, heart disease, and stroke.  Help with weight loss. What are tips for following this plan? Reading food labels  Check food labels for the amount of salt (sodium) per serving. Choose foods with less than 5 percent of the Daily Value of sodium. Generally, foods with less than 300 milligrams (mg) of sodium per serving fit into this eating plan.  To find whole grains, look for the word "whole" as the first word in the ingredient list. Shopping  Buy products labeled as "low-sodium" or "no salt added."  Buy fresh foods. Avoid canned foods and pre-made or frozen meals. Cooking  Avoid adding salt when cooking. Use salt-free seasonings or herbs instead of table salt or sea salt. Check with your health care provider or pharmacist before using salt substitutes.  Do not fry foods. Cook foods using healthy methods such as baking, boiling, grilling, roasting, and broiling instead.  Cook with heart-healthy oils, such as olive, canola, avocado, soybean, or sunflower oil. Meal planning  Eat a balanced diet that includes: ? 4 or more servings of fruits and 4 or more servings of vegetables each day. Try to fill one-half of your plate with fruits and vegetables. ? 6-8 servings of whole grains each day. ? Less than 6 oz (170 g) of lean meat, poultry, or fish each day. A 3-oz (85-g) serving of meat is about the same size as a deck of cards. One egg equals 1 oz (28 g). ? 2-3 servings of low-fat dairy each day. One serving is 1 cup (237 mL). ? 1 serving of nuts, seeds, or beans 5 times each  week. ? 2-3 servings of heart-healthy fats. Healthy fats called omega-3 fatty acids are found in foods such as walnuts,  flaxseeds, fortified milks, and eggs. These fats are also found in cold-water fish, such as sardines, salmon, and mackerel.  Limit how much you eat of: ? Canned or prepackaged foods. ? Food that is high in trans fat, such as some fried foods. ? Food that is high in saturated fat, such as fatty meat. ? Desserts and other sweets, sugary drinks, and other foods with added sugar. ? Full-fat dairy products.  Do not salt foods before eating.  Do not eat more than 4 egg yolks a week.  Try to eat at least 2 vegetarian meals a week.  Eat more home-cooked food and less restaurant, buffet, and fast food.   Lifestyle  When eating at a restaurant, ask that your food be prepared with less salt or no salt, if possible.  If you drink alcohol: ? Limit how much you use to:  0-1 drink a day for women who are not pregnant.  0-2 drinks a day for men. ? Be aware of how much alcohol is in your drink. In the [place] one drink equals one 12 oz bottle of beer (355 mL), one 5 oz glass of wine (148 mL), or one 1 oz glass of hard liquor (44 mL). General information  Avoid eating more than 2,300 mg of salt a day. If you have hypertension, you may need to reduce your sodium intake to 1,500 mg a day.  Work with your health care provider to maintain a healthy body weight or to lose weight. Ask what an ideal weight is for you.  Get at least 30 minutes of exercise that causes your heart to beat faster (aerobic exercise) most days of the week. Activities may include walking, swimming, or biking.  Work with your health care provider or dietitian to adjust your eating plan to your individual calorie needs. What foods should I eat? Fruits All fresh, dried, or frozen fruit. Canned fruit in natural juice (without added sugar). Vegetables Fresh or frozen vegetables (raw, steamed, roasted, or grilled). Low-sodium or reduced-sodium tomato and vegetable juice. Low-sodium or reduced-sodium tomato sauce and tomato paste.  Low-sodium or reduced-sodium canned vegetables. Grains Whole-grain or whole-wheat bread. Whole-grain or whole-wheat pasta. Tonette Koehne rice. Orpah Cobb. Bulgur. Whole-grain and low-sodium cereals. Pita bread. Low-fat, low-sodium crackers. Whole-wheat flour tortillas. Meats and other proteins Skinless chicken or [place]. Ground chicken or [place]. Pork with fat trimmed off. Fish and seafood. Egg whites. Dried beans, peas, or lentils. Unsalted nuts, nut butters, and seeds. Unsalted canned beans. Lean cuts of beef with fat trimmed off. Low-sodium, lean precooked or cured meat, such as sausages or meat loaves. Dairy Low-fat (1%) or fat-free (skim) milk. Reduced-fat, low-fat, or fat-free cheeses. Nonfat, low-sodium ricotta or cottage cheese. Low-fat or nonfat yogurt. Low-fat, low-sodium cheese. Fats and oils Soft margarine without trans fats. Vegetable oil. Reduced-fat, low-fat, or light mayonnaise and salad dressings (reduced-sodium). Canola, safflower, olive, avocado, soybean, and sunflower oils. Avocado. Seasonings and condiments Herbs. Spices. Seasoning mixes without salt. Other foods Unsalted popcorn and pretzels. Fat-free sweets. The items listed above may not be a complete list of foods and beverages you can eat. Contact a dietitian for more information. What foods should I avoid? Fruits Canned fruit in a light or heavy syrup. Fried fruit. Fruit in cream or butter sauce. Vegetables Creamed or fried vegetables. Vegetables in a cheese sauce. Regular canned vegetables (not low-sodium or reduced-sodium). Regular  canned tomato sauce and paste (not low-sodium or reduced-sodium). Regular tomato and vegetable juice (not low-sodium or reduced-sodium). Rosita Fire. Olives. Grains Baked goods made with fat, such as croissants, muffins, or some breads. Dry pasta or rice meal packs. Meats and other proteins Fatty cuts of meat. Ribs. Fried meat. Tomasa Blase. Bologna, salami, and other precooked or cured meats, such as  sausages or meat loaves. Fat from the back of a pig (fatback). Bratwurst. Salted nuts and seeds. Canned beans with added salt. Canned or smoked fish. Whole eggs or egg yolks. Chicken or [place] with skin. Dairy Whole or 2% milk, cream, and half-and-half. Whole or full-fat cream cheese. Whole-fat or sweetened yogurt. Full-fat cheese. Nondairy creamers. Whipped toppings. Processed cheese and cheese spreads. Fats and oils Butter. Stick margarine. Lard. Shortening. Ghee. Bacon fat. Tropical oils, such as coconut, palm kernel, or palm oil. Seasonings and condiments Onion salt, garlic salt, seasoned salt, table salt, and sea salt. Worcestershire sauce. Tartar sauce. Barbecue sauce. Teriyaki sauce. Soy sauce, including reduced-sodium. Steak sauce. Canned and packaged gravies. Fish sauce. Oyster sauce. Cocktail sauce. Store-bought horseradish. Ketchup. Mustard. Meat flavorings and tenderizers. Bouillon cubes. Hot sauces. Pre-made or packaged marinades. Pre-made or packaged taco seasonings. Relishes. Regular salad dressings. Other foods Salted popcorn and pretzels. The items listed above may not be a complete list of foods and beverages you should avoid. Contact a dietitian for more information. Where to find more information  National Heart, Lung, and Blood Institute: PopSteam.is  American Heart Association: www.heart.org  Academy of Nutrition and Dietetics: www.eatright.org  National Kidney Foundation: www.kidney.org Summary  The DASH eating plan is a healthy eating plan that has been shown to reduce high blood pressure (hypertension). It may also reduce your risk for type 2 diabetes, heart disease, and stroke.  When on the DASH eating plan, aim to eat more fresh fruits and vegetables, whole grains, lean proteins, low-fat dairy, and heart-healthy fats.  With the DASH eating plan, you should limit salt (sodium) intake to 2,300 mg a day. If you have hypertension, you may need to reduce your  sodium intake to 1,500 mg a day.  Work with your health care provider or dietitian to adjust your eating plan to your individual calorie needs. This information is not intended to replace advice given to you by your health care provider. Make sure you discuss any questions you have with your health care provider. Document Revised: 04/08/2019 Document Reviewed: 04/08/2019 Elsevier Patient Education  2021 [employer].

## 2020-09-26 ENCOUNTER — Other Ambulatory Visit: Payer: Self-pay

## 2020-10-03 ENCOUNTER — Other Ambulatory Visit: Payer: Self-pay

## 2020-10-03 ENCOUNTER — Encounter: Payer: Self-pay | Admitting: Cardiology

## 2020-10-03 ENCOUNTER — Telehealth: Payer: Self-pay

## 2020-10-03 ENCOUNTER — Ambulatory Visit: Payer: HMO | Admitting: Cardiology

## 2020-10-03 VITALS — BP 130/78 | HR 90 | Ht 65.0 in | Wt 193.2 lb

## 2020-10-03 DIAGNOSIS — E782 Mixed hyperlipidemia: Secondary | ICD-10-CM | POA: Diagnosis not present

## 2020-10-03 DIAGNOSIS — E088 Diabetes mellitus due to underlying condition with unspecified complications: Secondary | ICD-10-CM | POA: Diagnosis not present

## 2020-10-03 DIAGNOSIS — I10 Essential (primary) hypertension: Secondary | ICD-10-CM

## 2020-10-03 DIAGNOSIS — I2510 Atherosclerotic heart disease of native coronary artery without angina pectoris: Secondary | ICD-10-CM | POA: Diagnosis not present

## 2020-10-03 NOTE — Progress Notes (Signed)
Cardiology Office Note:    Date:  10/03/2020   ID:  Mitchell Hammond, DOB Sep 20, 1955, MRN [id number]  PCP:  Abigail Miyamoto, MD  Cardiologist:  Garwin Brothers, MD   Referring MD: Abigail Miyamoto,*    ASSESSMENT:    1. Coronary artery disease involving native coronary artery of native heart without angina pectoris   2. Essential hypertension   3. Mixed hyperlipidemia   4. Diabetes mellitus due to underlying condition with unspecified complications (HCC)    PLAN:    In order of problems listed above:  1. Coronary artery disease: Secondary prevention stressed with patient.  Importance of compliance with diet medication stressed any vocalized understanding.  He cannot do much in terms of exercise because of orthopedic issues mentioned below. 2. Essential hypertension: Blood pressure stable and diet was emphasized.  Lifestyle modification urged. 3. Mixed dyslipidemia and diabetes mellitus: I discussed my findings with the patient at length.  His lipids need to be better.  Diet was emphasized.  He will be back in 3 months for follow-up lipid check.  I mentioned to him also about elevated hemoglobin A1c and the risks of obesity and dyslipidemia and diabetes and he understands. 4. Obesity: Weight reduction stressed and he promises to do better.  Regular weight check at home was encouraged. 5. Patient will be seen in follow-up appointment in 6 months or earlier if the patient has any concerns    Medication Adjustments/Labs and Tests Ordered: Current medicines are reviewed at length with the patient today.  Concerns regarding medicines are outlined above.  No orders of the defined types were placed in this encounter.  No orders of the defined types were placed in this encounter.    No chief complaint on file.    History of Present Illness:    Mitchell Hammond is a 65 y.o. male.  Patient has past medical history of coronary artery disease, essential hypertension dyslipidemia  and diabetes mellitus.  He denies any problems at this time and takes care of activities of daily living.  No chest pain orthopnea or PND.  He leads a sedentary lifestyle because of orthopedic issues involving his right and his knees.  At the time of my evaluation, the patient is alert awake oriented and in no distress.  Past Medical History:  Diagnosis Date  . Angina pectoris (HCC) 12/26/2015  . CAD (coronary artery disease)   . Chip fracture of triquetral bone of right wrist, closed, initial encounter 12/09/2016  . Chronic back pain   . Contracture of tendon sheath 08/20/2016  . 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 Diabetes mellitus due to underlying condition with unspecified complications (HCC) 12/26/2015  . Diabetic polyneuropathy associated with type 2 diabetes mellitus (HCC) 08/20/2016  . Discitis   . Essential hypertension 12/26/2015  . Fracture, tibial plateau 12/18/2014  . Hypertension   . 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 Metatarsalgia of both feet 08/20/2016  . MI (myocardial infarction) (HCC)    x 2, Multi-link Vision 3.5 x 15 mm BMS in 2009 (mid LAD), balloon 2015  . Mixed hyperlipidemia 06/14/2020  . Obesity (BMI 30.0-34.9) 06/26/2020  . Tibial plateau fracture 12/18/2014  . Vitamin D insufficiency     Past Surgical History:  Procedure Laterality Date  . BACK SURGERY     2014  . CARDIAC CATHETERIZATION     x 2, 2009 & 2015  . COLONOSCOPY    . CORONARY  ANGIOPLASTY     stent - 2008, balloon - 2015  . FASCIOTOMY Right 12/19/2014   Procedure: ANTERIOR COMPARTMENTAL FASCIOTOMY;  Surgeon: Myrene Galas, MD;  Location: [facility] OR;  Service: Orthopedics;  Laterality: Right;  . HAND SURGERY Left   . HARDWARE REMOVAL Right 07/24/2015   Procedure: HARDWARE REMOVAL RIGHT TIBIA;  Surgeon: Myrene Galas, MD;  Location: [facility] OR;  Service: Orthopedics;  Laterality: Right;  . KNEE ARTHROSCOPY Right 07/24/2015   Procedure: RIGHT ARTHROSCOPY KNEE WITH PARTIAL SYNOVECTOMY  AND MEDIAL MENISCECTOMY;  Surgeon: Myrene Galas, MD;  Location: [facility] OR;  Service: Orthopedics;  Laterality: Right;  . LEFT HEART CATH AND CORONARY ANGIOGRAPHY N/A 07/02/2020   Procedure: LEFT HEART CATH AND CORONARY ANGIOGRAPHY;  Surgeon: Lennette Bihari, MD;  Location: [facility] INVASIVE CV LAB;  Service: Cardiovascular;  Laterality: N/A;  . ORIF TIBIA PLATEAU Right 12/19/2014   Procedure: OPEN REDUCTION INTERNAL FIXATION (ORIF) RIGHT TIBIAL PLATEAU;  Surgeon: Myrene Galas, MD;  Location: [facility] OR;  Service: Orthopedics;  Laterality: Right;  . TONSILLECTOMY    . VASECTOMY      Current Medications: Current Meds  Medication Sig  . aspirin EC 81 MG tablet Take 81 mg by mouth daily.  Marland Kitchen atorvastatin (LIPITOR) 40 MG tablet Take 1 tablet by mouth once daily (Patient taking differently: Take 40 mg by mouth daily.)  . cholecalciferol (VITAMIN D3) 25 MCG (1000 UNIT) tablet Take 1,000 Units by mouth daily.  . fenofibrate (TRICOR) 145 MG tablet Take 145 mg by mouth daily.  Marland Kitchen gabapentin (NEURONTIN) 600 MG tablet Take 1 tablet (600 mg total) by mouth in the morning, at noon, in the evening, and at bedtime.  . insulin degludec (TRESIBA FLEXTOUCH) 100 UNIT/ML FlexTouch Pen Inject 40 Units into the skin daily.  . metFORMIN (GLUCOPHAGE) 1000 MG tablet Take 1 tablet by mouth twice daily  . metoprolol succinate (TOPROL-XL) 25 MG 24 hr tablet Take 1 tablet (25 mg total) by mouth daily.  . mupirocin ointment (BACTROBAN) 2 % Apply topically 2 (two) times daily.  . nitroGLYCERIN (NITROSTAT) 0.4 MG SL tablet Place 1 tablet (0.4 mg total) under the tongue every 5 (five) minutes as needed for chest pain.  . Semaglutide,0.25 or 0.5MG /DOS, (OZEMPIC, 0.25 OR 0.5 MG/DOSE,) 2 MG/1.5ML SOPN Inject 0.5 mg into the skin once a week.  Marland Kitchen tiZANidine (ZANAFLEX) 2 MG tablet Take 2 mg by mouth at bedtime.  . vitamin C (VITAMIN C) 500 MG tablet Take 1 tablet (500 mg total) by mouth 2 (two) times daily.  Marland Kitchen zolpidem (AMBIEN) 5 MG tablet Take 1  tablet (5 mg total) by mouth at bedtime as needed for sleep.     Allergies:   Patient has no known allergies.   Social History   Socioeconomic History  . Marital status: Married    Spouse name: Not on file  . Number of children: 2  . Years of education: Not on file  . Highest education level: Not on file  Occupational History  . Occupation: disabled  Tobacco Use  . Smoking status: Former Smoker    Packs/day: 1.00    Types: Cigarettes    Quit date: 02/20/2007    Years since quitting: 13.6  . Smokeless tobacco: Never Used  Vaping Use  . Vaping Use: Never used  Substance and Sexual Activity  . Alcohol use: No    Alcohol/week: 0.0 standard drinks    Comment: None in years  . Drug use: No  . Sexual activity: Yes  Partners: Female  Other Topics Concern  . Not on file  Social History Narrative   Pt lives with wife and daughter in [place].    Social Determinants of Health   Financial Resource Strain: Not on file  Food Insecurity: No Food Insecurity  . Worried About Programme researcher, broadcasting/film/video in the Last Year: Never true  . Ran Out of Food in the Last Year: Never true  Transportation Needs: No Transportation Needs  . Lack of Transportation (Medical): No  . Lack of Transportation (Non-Medical): No  Physical Activity: Not on file  Stress: Not on file  Social Connections: Not on file     Family History: The patient's family history includes CAD in his mother; Diabetes in his mother; Liver disease in his father.  ROS:   Please see the history of present illness.    All other systems reviewed and are negative.  EKGs/Labs/Other Studies Reviewed:    The following studies were reviewed today: Lennette Bihari, MD (Primary)      Procedures  LEFT HEART CATH AND CORONARY ANGIOGRAPHY   Conclusion    Prox RCA to Mid RCA lesion is 10% stenosed.  Dist RCA lesion is 20% stenosed.  Mid LM to Dist LM lesion is 10% stenosed.  Prox Cx lesion is 30% stenosed.  1st Mrg  lesion is 20% stenosed.  Prox LAD lesion is 10% stenosed.  Mid LAD-1 lesion is 30% stenosed.  Previously placed Mid LAD-2 stent (unknown type) is widely patent.  The left ventricular ejection fraction is 55-65% by visual estimate.  The left ventricular systolic function is normal.  LV end diastolic pressure is normal.   Mild nonobstructive multivessel CAD with luminal irregularities and widely patent mid LAD stent.  Normal LV function without focal segmental wall motion abnormalities.  EF estimate 55 to 60%.  LVEDP 8 mmHg.  RECOMMENDATION: Medical therapy with optimal blood pressure control and lipid-lowering therapy with target LDL less than 70.  Patient will follow up with Dr. Tomie China.       Recent Labs: 08/29/2020: ALT 92; BUN 14; Creatinine, Ser 1.07; Hemoglobin 15.9; Platelets 197; Potassium 4.9; Sodium 144  Recent Lipid Panel    Component Value Date/Time   CHOL 167 08/29/2020 1059   TRIG 88 08/29/2020 1059   HDL 39 (L) 08/29/2020 1059   CHOLHDL 4.3 08/29/2020 1059   LDLCALC 111 (H) 08/29/2020 1059    Physical Exam:    VS:  BP 130/78   Pulse 90   Ht 5\' 5"  (1.651 m)   Wt 193 lb 3.2 oz (87.6 kg)   SpO2 97%   BMI 32.15 kg/m     Wt Readings from Last 3 Encounters:  10/03/20 193 lb 3.2 oz (87.6 kg)  08/29/20 195 lb (88.5 kg)  07/24/20 198 lb 6.4 oz (90 kg)     GEN: Patient is in no acute distress HEENT: Normal NECK: No JVD; No carotid bruits LYMPHATICS: No lymphadenopathy CARDIAC: Hear sounds regular, 2/6 systolic murmur at the apex. RESPIRATORY:  Clear to auscultation without rales, wheezing or rhonchi  ABDOMEN: Soft, non-tender, non-distended MUSCULOSKELETAL:  No edema; No deformity  SKIN: Warm and dry NEUROLOGIC:  Alert and oriented x 3 PSYCHIATRIC:  Normal affect   Signed, 09/23/20, MD  10/03/2020 4:10 PM    [facility]

## 2020-10-03 NOTE — Patient Instructions (Signed)
Medication Instructions:  No medication changes. *If you need a refill on your cardiac medications before your next appointment, please call your pharmacy*   Lab Work: Your physician recommends that you return for lab work in: 3 months (8/22). You need to have labs done when you are fasting.  You can come Monday through Friday 8:30 am to 12:00 pm and 1:15 to 4:30. You do not need to make an appointment as the order has already been placed. The labs you are going to have done are BMET, LFT and Lipids.  If you have labs (blood work) drawn today and your tests are completely normal, you will receive your results only by: Marland Kitchen MyChart Message (if you have MyChart) OR . A paper copy in the mail If you have any lab test that is abnormal or we need to change your treatment, we will call you to review the results.   Testing/Procedures: None ordered   Follow-Up: At [facility], you and your health needs are our priority.  As part of our continuing mission to provide you with exceptional heart care, we have created designated Provider Care Teams.  These Care Teams include your primary Cardiologist (physician) and Advanced Practice Providers (APPs -  Physician Assistants and Nurse Practitioners) who all work together to provide you with the care you need, when you need it.  We recommend signing up for the patient portal called "MyChart".  Sign up information is provided on this After Visit Summary.  MyChart is used to connect with patients for Virtual Visits (Telemedicine).  Patients are able to view lab/test results, encounter notes, upcoming appointments, etc.  Non-urgent messages can be sent to your provider as well.   To learn more about what you can do with MyChart, go to [email or web address].    Your next appointment:   6 month(s)  The format for your next appointment:   In Person  Provider:   Belva Crome, MD   Other Instructions NA

## 2020-10-04 NOTE — Chronic Care Management (AMB) (Signed)
    Chronic Care Management Pharmacy Assistant   Name: Bach Rocchi  MRN: [id number] DOB: 14-Feb-1956   Reason for Encounter: Ozempic refill    Medications: Outpatient Encounter Medications as of 10/03/2020  Medication Sig  . aspirin EC 81 MG tablet Take 81 mg by mouth daily.  Marland Kitchen atorvastatin (LIPITOR) 40 MG tablet Take 1 tablet by mouth once daily (Patient taking differently: Take 40 mg by mouth daily.)  . cholecalciferol (VITAMIN D3) 25 MCG (1000 UNIT) tablet Take 1,000 Units by mouth daily.  . fenofibrate (TRICOR) 145 MG tablet Take 145 mg by mouth daily.  Marland Kitchen gabapentin (NEURONTIN) 600 MG tablet Take 1 tablet (600 mg total) by mouth in the morning, at noon, in the evening, and at bedtime.  . insulin degludec (TRESIBA FLEXTOUCH) 100 UNIT/ML FlexTouch Pen Inject 40 Units into the skin daily.  . metFORMIN (GLUCOPHAGE) 1000 MG tablet Take 1 tablet by mouth twice daily  . metoprolol succinate (TOPROL-XL) 25 MG 24 hr tablet Take 1 tablet (25 mg total) by mouth daily.  . mupirocin ointment (BACTROBAN) 2 % Apply topically 2 (two) times daily.  . nitroGLYCERIN (NITROSTAT) 0.4 MG SL tablet Place 1 tablet (0.4 mg total) under the tongue every 5 (five) minutes as needed for chest pain.  . Semaglutide,0.25 or 0.5MG /DOS, (OZEMPIC, 0.25 OR 0.5 MG/DOSE,) 2 MG/1.5ML SOPN Inject 0.5 mg into the skin once a week.  Marland Kitchen tiZANidine (ZANAFLEX) 2 MG tablet Take 2 mg by mouth at bedtime.  . vitamin C (VITAMIN C) 500 MG tablet Take 1 tablet (500 mg total) by mouth 2 (two) times daily.  Marland Kitchen zolpidem (AMBIEN) 5 MG tablet Take 1 tablet (5 mg total) by mouth at bedtime as needed for sleep.   No facility-administered encounter medications on file as of 10/03/2020.    Spoke with patient regarding refill for Ozempic. Patient stated that his last dose will be on 10/10/2020. Acute form  has been sent to pharmacy for a delivery date of 10/10/2020. Patient is aware of this delivery. He stated that he received a voucher for a  sensor from [employer] and when he took it to [facility] to redeem it, they said he had to have a prescription. I advised Juliane Lack, RPh and she will have a nurse send to [facility] and we will deliver this with his Ozempic.  He stated that he went to the cardiologist yesterday and "got a good report". He also reported that his sugars have stablized since he started Ozempic.   Josiah Lobo, CMA  [phone] Clinical Pharmacist Assistant

## 2020-10-10 ENCOUNTER — Other Ambulatory Visit: Payer: Self-pay

## 2020-10-10 MED ORDER — FREESTYLE LIBRE 14 DAY SENSOR MISC: 1.0000 | 2 refills | Status: DC

## 2020-10-23 ENCOUNTER — Telehealth: Payer: HMO

## 2020-11-07 ENCOUNTER — Telehealth: Payer: Self-pay

## 2020-11-07 NOTE — Progress Notes (Signed)
Chronic Care Management Pharmacy Assistant   Name: Mitchell Hammond  MRN: [id number] DOB: 09-Feb-1956   Reason for Encounter: Medication coordination for [facility]  Recent office visits:  09/25/20-[facility], Patient is willing to begin Ozempic  0.5 mg x4 weeks with option to increase to 1 mg weekly if well tolerated. Recommend beginning Tresiba 40 units daily with close follow-up on blood sugar readings to adjust as needed.  Recent consult visits:  10/03/20-Dr Revankar, cardiology, labs ordered, no medication changes, results of heart cath were reviewed.  Follow up 81mos    Hospital visits:  None in previous 6 months  Medications: Outpatient Encounter Medications as of 11/07/2020  Medication Sig   aspirin EC 81 MG tablet Take 81 mg by mouth daily.   atorvastatin (LIPITOR) 40 MG tablet Take 1 tablet by mouth once daily (Patient taking differently: Take 40 mg by mouth daily.)   cholecalciferol (VITAMIN D3) 25 MCG (1000 UNIT) tablet Take 1,000 Units by mouth daily.   Continuous Blood Gluc Sensor (FREESTYLE LIBRE 14 DAY SENSOR) MISC 1 each by Does not apply route every 14 (fourteen) days.   fenofibrate (TRICOR) 145 MG tablet Take 145 mg by mouth daily.   gabapentin (NEURONTIN) 600 MG tablet Take 1 tablet (600 mg total) by mouth in the morning, at noon, in the evening, and at bedtime.   insulin degludec (TRESIBA FLEXTOUCH) 100 UNIT/ML FlexTouch Pen Inject 40 Units into the skin daily.   metFORMIN (GLUCOPHAGE) 1000 MG tablet Take 1 tablet by mouth twice daily   metoprolol succinate (TOPROL-XL) 25 MG 24 hr tablet Take 1 tablet (25 mg total) by mouth daily.   mupirocin ointment (BACTROBAN) 2 % Apply topically 2 (two) times daily.   nitroGLYCERIN (NITROSTAT) 0.4 MG SL tablet Place 1 tablet (0.4 mg total) under the tongue every 5 (five) minutes as needed for chest pain.   Semaglutide,0.25 or 0.5MG /DOS, (OZEMPIC, 0.25 OR 0.5 MG/DOSE,) 2 MG/1.5ML SOPN Inject 0.5 mg into the skin once a week.    tiZANidine (ZANAFLEX) 2 MG tablet Take 2 mg by mouth at bedtime.   vitamin C (VITAMIN C) 500 MG tablet Take 1 tablet (500 mg total) by mouth 2 (two) times daily.   zolpidem (AMBIEN) 5 MG tablet Take 1 tablet (5 mg total) by mouth at bedtime as needed for sleep.   No facility-administered encounter medications on file as of 11/07/2020.   Reviewed chart for medication changes ahead of medication coordination call.  No OVs, Consults, or hospital visits since last care coordination call/Pharmacist visit. (If appropriate, list visit date, provider name)  No medication changes indicated OR if recent visit, treatment plan here.  BP Readings from Last 3 Encounters:  10/03/20 130/78  08/29/20 130/60  07/24/20 130/60    Lab Results  Component Value Date   HGBA1C 8.9 (H) 08/29/2020    Unable to reach patient.     Patient obtains medications through Adherence Packaging  30 Days   Last adherence delivery included:  Patient has had no previous deliveries  Patient declined (meds) last month due to PRN use/additional supply on hand. None  Patient is due for next adherence delivery on: 11/16/20. Called patient and reviewed medications and coordinated delivery.  This delivery to include: [facility] filled due to not being able to reach patient.   Patient will need a short fill of (med), prior to adherence delivery. (To align with sync date or if PRN med)  Coordinated acute fill for Amoxicillin and Guaifenesin per Lucia Gaskins  CPP request (11/09/20)   Patient declined the following medications (meds) due to (reason)  Patient needs refills for .  Confirmed delivery date of , advised patient that pharmacy will contact them the morning of delivery.  Leilani Able, CMA Clinical Pharmacist Assistant [phone]

## 2020-11-08 ENCOUNTER — Ambulatory Visit (INDEPENDENT_AMBULATORY_CARE_PROVIDER_SITE_OTHER): Payer: HMO | Admitting: Legal Medicine

## 2020-11-08 ENCOUNTER — Encounter: Payer: Self-pay | Admitting: Legal Medicine

## 2020-11-08 VITALS — BP 140/60 | HR 102 | Temp 101.7°F | Resp 16 | Ht 65.0 in | Wt 191.0 lb

## 2020-11-08 DIAGNOSIS — R6889 Other general symptoms and signs: Secondary | ICD-10-CM | POA: Diagnosis not present

## 2020-11-08 DIAGNOSIS — J0100 Acute maxillary sinusitis, unspecified: Secondary | ICD-10-CM | POA: Diagnosis not present

## 2020-11-08 DIAGNOSIS — R509 Fever, unspecified: Secondary | ICD-10-CM | POA: Diagnosis not present

## 2020-11-08 LAB — POCT INFLUENZA A/B
Influenza A, POC: NEGATIVE
Influenza B, POC: NEGATIVE

## 2020-11-08 LAB — POC COVID19 BINAXNOW: SARS Coronavirus 2 Ag: NEGATIVE

## 2020-11-08 MED ORDER — TRIAMCINOLONE ACETONIDE 40 MG/ML IJ SUSP
80.0000 mg | Freq: Once | INTRAMUSCULAR | Status: AC
  Administered 2020-11-08: 80 mg via INTRAMUSCULAR

## 2020-11-08 MED ORDER — CHERATUSSIN AC 100-10 MG/5ML PO SOLN: 5.0000 mL | Freq: Three times a day (TID) | ORAL | 0 refills | Status: DC | PRN

## 2020-11-08 MED ORDER — AMOXICILLIN 875 MG PO TABS: 875.0000 mg | ORAL_TABLET | Freq: Two times a day (BID) | ORAL | 0 refills | Status: AC

## 2020-11-08 NOTE — Progress Notes (Signed)
Acute Office Visit  Subjective:    Patient ID: Mitchell Hammond, male    DOB: [birth year], 65 y.o.   MRN: [id number]  Chief Complaint  Patient presents with   Fever   Cough    Dry cough   Nausea    HPI Patient is in today for 4 days with sudden onset of cough, myalgias, temp 100.5.  family has same thing.  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 07/05/2019   Diabetes mellitus due to underlying condition with unspecified complications ([place]) 10/20/8451   Diabetic polyneuropathy associated with type 2 diabetes mellitus ([place]) 08/20/2016   Discitis    Essential hypertension 12/26/2015   Fracture, tibial plateau 12/18/2014   Hypertension    IDDM (insulin dependent diabetes mellitus)    type 2   Metatarsalgia of both feet 08/20/2016   MI (myocardial infarction) ([place])    x 2, Multi-link Vision 3.5 x 15 mm BMS in 2009 (mid LAD), balloon 2015   Mixed hyperlipidemia 06/14/2020   Obesity (BMI 30.0-34.9) 06/26/2020   Tibial plateau fracture 12/18/2014   Vitamin D insufficiency     Past Surgical History:  Procedure Laterality Date   BACK SURGERY     2014   CARDIAC CATHETERIZATION     x 2, 2009 & 2015   COLONOSCOPY     CORONARY ANGIOPLASTY     stent - 2008, balloon - 2015   FASCIOTOMY Right 12/19/2014   Procedure: ANTERIOR COMPARTMENTAL FASCIOTOMY;  Surgeon: Altamese Greenwood, MD;  Location: [place];  Service: Orthopedics;  Laterality: Right;   HAND SURGERY Left    HARDWARE REMOVAL Right 07/24/2015   Procedure: HARDWARE REMOVAL RIGHT TIBIA;  Surgeon: Altamese Soldier, MD;  Location: [place];  Service: Orthopedics;  Laterality: Right;   KNEE ARTHROSCOPY Right 07/24/2015   Procedure: RIGHT ARTHROSCOPY KNEE WITH PARTIAL SYNOVECTOMY AND MEDIAL MENISCECTOMY;   Surgeon: Altamese Lidgerwood, MD;  Location: [place];  Service: Orthopedics;  Laterality: Right;   LEFT HEART CATH AND CORONARY ANGIOGRAPHY N/A 07/02/2020   Procedure: LEFT HEART CATH AND CORONARY ANGIOGRAPHY;  Surgeon: [name], MD;  Location: [place] [facility];  Service: Cardiovascular;  Laterality: N/A;   ORIF TIBIA PLATEAU Right 12/19/2014   Procedure: OPEN REDUCTION INTERNAL FIXATION (ORIF) RIGHT TIBIAL PLATEAU;  Surgeon: Altamese Stanchfield, MD;  Location: [place];  Service: Orthopedics;  Laterality: Right;   TONSILLECTOMY     VASECTOMY      Family History  Problem Relation Age of Onset   Diabetes Mother    CAD Mother    Liver disease Father     Social History   Socioeconomic History   Marital status: Married    Spouse name: Not on file   Number of children: 2   Years of education: Not on file   Highest education level: Not on file  Occupational History   Occupation: disabled  Tobacco Use   Smoking status: Former    Packs/day: 1.00    Pack years: 0.00    Types: Cigarettes    Quit date: 02/20/2007    Years since quitting: 13.7   Smokeless tobacco: Never  Vaping Use   Vaping Use: Never used  Substance and Sexual Activity   Alcohol  use: No    Alcohol/week: 0.0 standard drinks    Comment: None in years   Drug use: No   Sexual activity: Yes    Partners: Female  Other Topics Concern   Not on file  Social History Narrative   Pt lives with wife and daughter in [place].    Social Determinants of Health   Financial Resource Strain: Not on file  Food Insecurity: No Food Insecurity   Worried About Charity fundraiser in the Last Year: Never true   Ran Out of Food in the Last Year: Never true  Transportation Needs: No Transportation Needs   Lack of Transportation (Medical): No   Lack of Transportation (Non-Medical): No  Physical Activity: Not on file  Stress: Not on file  Social Connections: Not on file  Intimate Partner Violence: Not on file    Outpatient Medications  Prior to Visit  Medication Sig Dispense Refill   aspirin EC 81 MG tablet Take 81 mg by mouth daily.     atorvastatin (LIPITOR) 40 MG tablet Take 1 tablet by mouth once daily (Patient taking differently: Take 40 mg by mouth daily.) 90 tablet 2   cholecalciferol (VITAMIN D3) 25 MCG (1000 UNIT) tablet Take 1,000 Units by mouth daily.     Continuous Blood Gluc Sensor (FREESTYLE LIBRE 14 DAY SENSOR) MISC 1 each by Does not apply route every 14 (fourteen) days. 6 each 2   fenofibrate (TRICOR) 145 MG tablet Take 145 mg by mouth daily.     gabapentin (NEURONTIN) 600 MG tablet Take 1 tablet (600 mg total) by mouth in the morning, at noon, in the evening, and at bedtime. 360 tablet 2   insulin degludec (TRESIBA FLEXTOUCH) 100 UNIT/ML FlexTouch Pen Inject 40 Units into the skin daily. 15 mL 3   metFORMIN (GLUCOPHAGE) 1000 MG tablet Take 1 tablet by mouth twice daily 180 tablet 2   metoprolol succinate (TOPROL-XL) 25 MG 24 hr tablet Take 1 tablet (25 mg total) by mouth daily. 90 tablet 2   mupirocin ointment (BACTROBAN) 2 % Apply topically 2 (two) times daily. 22 g 6   nitroGLYCERIN (NITROSTAT) 0.4 MG SL tablet Place 1 tablet (0.4 mg total) under the tongue every 5 (five) minutes as needed for chest pain. 25 tablet 6   ONETOUCH VERIO test strip 1 each daily.     Semaglutide,0.25 or 0.5MG/DOS, (OZEMPIC, 0.25 OR 0.5 MG/DOSE,) 2 MG/1.5ML SOPN Inject 0.5 mg into the skin once a week. 3 mL 6   tiZANidine (ZANAFLEX) 2 MG tablet Take 2 mg by mouth at bedtime.     vitamin C (VITAMIN C) 500 MG tablet Take 1 tablet (500 mg total) by mouth 2 (two) times daily. 60 tablet 2   zolpidem (AMBIEN) 5 MG tablet Take 1 tablet (5 mg total) by mouth at bedtime as needed for sleep. 30 tablet 3   No facility-administered medications prior to visit.    No Known Allergies  Review of Systems  Constitutional:  Positive for fever. Negative for chills and fatigue.  HENT:  Positive for sinus pain. Negative for congestion, ear pain  and sore throat.   Respiratory:  Positive for cough. Negative for shortness of breath.   Cardiovascular:  Negative for chest pain.  Gastrointestinal:  Negative for abdominal pain, constipation, diarrhea, nausea and vomiting.  Endocrine: Negative for polydipsia, polyphagia and polyuria.  Genitourinary:  Negative for dysuria and frequency.  Musculoskeletal:  Negative for arthralgias and myalgias.  Neurological:  Negative for dizziness and  headaches.  Psychiatric/Behavioral:  Negative for dysphoric mood.        No dysphoria      Objective:    Physical Exam Vitals reviewed.  Constitutional:      General: He is in acute distress.     Appearance: Normal appearance.  HENT:     Head: Normocephalic.     Right Ear: Tympanic membrane, ear canal and external ear normal.     Left Ear: Tympanic membrane, ear canal and external ear normal.     Mouth/Throat:     Mouth: Mucous membranes are dry.     Pharynx: Oropharynx is clear.  Eyes:     Extraocular Movements: Extraocular movements intact.     Conjunctiva/sclera: Conjunctivae normal.     Pupils: Pupils are equal, round, and reactive to light.  Cardiovascular:     Rate and Rhythm: Normal rate and regular rhythm.     Pulses: Normal pulses.     Heart sounds: Normal heart sounds.    No gallop.  Pulmonary:     Effort: Pulmonary effort is normal.     Breath sounds: Rhonchi present.  Musculoskeletal:        General: Tenderness present.     Cervical back: Normal range of motion and neck supple.  Skin:    General: Skin is warm.     Capillary Refill: Capillary refill takes less than 2 seconds.  Neurological:     Mental Status: He is alert.    BP 140/60   Pulse (!) 102   Temp (!) 101.7 F (38.7 C)   Resp 16   Ht 5' 5"  (1.651 m)   Wt 191 lb (86.6 kg)   SpO2 94%   BMI 31.78 kg/m  Wt Readings from Last 3 Encounters:  11/08/20 191 lb (86.6 kg)  10/03/20 193 lb 3.2 oz (87.6 kg)  08/29/20 195 lb (88.5 kg)    Health Maintenance Due   Topic Date Due   Hepatitis C Screening  Never done   TETANUS/TDAP  Never done   COLONOSCOPY (Pts 45-59yr Insurance coverage will need to be confirmed)  Never done   Zoster Vaccines- Shingrix (1 of 2) Never done   OPHTHALMOLOGY EXAM  07/25/2020   COVID-19 Vaccine (4 - Booster for Moderna series) 09/05/2020    There are no preventive care reminders to display for this patient.   Lab Results  Component Value Date   TSH 2.829 12/18/2014   Lab Results  Component Value Date   WBC 6.2 08/29/2020   HGB 15.9 08/29/2020   HCT 47.8 08/29/2020   MCV 84 08/29/2020   PLT 197 08/29/2020   Lab Results  Component Value Date   NA 144 08/29/2020   K 4.9 08/29/2020   CO2 20 08/29/2020   GLUCOSE 125 (H) 08/29/2020   BUN 14 08/29/2020   CREATININE 1.07 08/29/2020   BILITOT 0.7 08/29/2020   ALKPHOS 77 08/29/2020   AST 86 (H) 08/29/2020   ALT 92 (H) 08/29/2020   PROT 7.1 08/29/2020   ALBUMIN 4.7 08/29/2020   CALCIUM 9.5 08/29/2020   ANIONGAP 12 07/24/2015   EGFR 77 08/29/2020   Lab Results  Component Value Date   CHOL 167 08/29/2020   Lab Results  Component Value Date   HDL 39 (L) 08/29/2020   Lab Results  Component Value Date   LDLCALC 111 (H) 08/29/2020   Lab Results  Component Value Date   TRIG 88 08/29/2020   Lab Results  Component Value Date  CHOLHDL 4.3 08/29/2020   Lab Results  Component Value Date   HGBA1C 8.9 (H) 08/29/2020       Assessment & Plan:  Diagnoses and all orders for this visit: Flu-like symptoms -     Influenza A/BA and B negative flu Fever, unspecified fever cause -     POC COVID-19 Rapid covid test negative  Acute non-recurrent maxillary sinusitis -     amoxicillin (AMOXIL) 875 MG tablet; Take 1 tablet (875 mg total) by mouth 2 (two) times daily for 10 days. -     guaiFENesin-codeine (CHERATUSSIN AC) 100-10 MG/5ML syrup; Take 5 mLs by mouth 3 (three) times daily as needed. -     triamcinolone acetonide (KENALOG-40) injection 80 mg   Patient has sinusitis with allergies and chronic cough.  Gave kenalog 40 mg IM       I spent 20 minutes dedicated to the care of this patient on the date of this encounter to include face-to-face time with the patient, as well as:   Follow-up: Return if symptoms worsen or fail to improve.  An After Visit Summary was printed and given to the patient.  Reinaldo Meeker, MD [facility] [phone]

## 2020-11-14 ENCOUNTER — Encounter: Payer: Self-pay | Admitting: Legal Medicine

## 2020-11-14 ENCOUNTER — Ambulatory Visit (INDEPENDENT_AMBULATORY_CARE_PROVIDER_SITE_OTHER): Payer: HMO | Admitting: Legal Medicine

## 2020-11-14 VITALS — BP 120/64 | HR 72 | Temp 97.7°F | Resp 17 | Ht 65.0 in | Wt 190.0 lb

## 2020-11-14 DIAGNOSIS — J028 Acute pharyngitis due to other specified organisms: Secondary | ICD-10-CM | POA: Diagnosis not present

## 2020-11-14 DIAGNOSIS — J180 Bronchopneumonia, unspecified organism: Secondary | ICD-10-CM | POA: Insufficient documentation

## 2020-11-14 LAB — POCT RAPID STREP A (OFFICE): Rapid Strep A Screen: POSITIVE — AB

## 2020-11-14 MED ORDER — PREDNISONE 10 MG (21) PO TBPK: ORAL_TABLET | ORAL | 0 refills | Status: DC

## 2020-11-14 MED ORDER — AZITHROMYCIN 250 MG PO TABS: ORAL_TABLET | ORAL | 0 refills | Status: AC

## 2020-11-14 MED ORDER — ALBUTEROL SULFATE HFA 108 (90 BASE) MCG/ACT IN AERS: 2.0000 | INHALATION_SPRAY | Freq: Four times a day (QID) | RESPIRATORY_TRACT | 0 refills | Status: DC | PRN

## 2020-11-14 NOTE — Progress Notes (Signed)
Established Patient Office Visit  Subjective:  Patient ID: Mitchell Hammond, male    DOB: January 19, 1956  Age: 65 y.o. MRN: [id number]  CC:  Chief Complaint  Patient presents with   Cough    HPI [name] presents for cough.  He was treated last week for cough with amoxicillin , and steroids. He is coughing at night. Some clear phlegm.  Unable to sleep. He got better and 3 days ago, it returned.  He was treated with doneb nebulizer that helped his breathing.  Past Medical History:  Diagnosis Date   Angina pectoris ([place]) 12/26/2015   CAD (coronary artery disease)    Chip fracture of triquetral bone of right wrist, closed, initial encounter 12/09/2016   Chronic back pain    Contracture of tendon sheath 08/20/2016   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 07/05/2019   Diabetes mellitus due to underlying condition with unspecified complications ([name]) 7/0/2637   Diabetic polyneuropathy associated with type 2 diabetes mellitus ([name]) 08/20/2016   Discitis    Essential hypertension 12/26/2015   Fracture, tibial plateau 12/18/2014   Hypertension    IDDM (insulin dependent diabetes mellitus)    type 2   Metatarsalgia of both feet 08/20/2016   MI (myocardial infarction) ([place])    x 2, Multi-link Vision 3.5 x 15 mm BMS in 2009 (mid LAD), balloon 2015   Mixed hyperlipidemia 06/14/2020   Obesity (BMI 30.0-34.9) 06/26/2020   Tibial plateau fracture 12/18/2014   Vitamin D insufficiency     Past Surgical History:  Procedure Laterality Date   BACK SURGERY     2014   CARDIAC CATHETERIZATION     x 2, 2009 & 2015   COLONOSCOPY     CORONARY ANGIOPLASTY     stent - 2008, balloon - 2015   FASCIOTOMY Right 12/19/2014   Procedure: ANTERIOR COMPARTMENTAL FASCIOTOMY;  Surgeon: [name], MD;  Location: [facility];  Service: Orthopedics;  Laterality: Right;   HAND SURGERY Left    HARDWARE REMOVAL Right 07/24/2015   Procedure: HARDWARE REMOVAL RIGHT TIBIA;  Surgeon: [name], MD;  Location: [facility];  Service:  Orthopedics;  Laterality: Right;   KNEE ARTHROSCOPY Right 07/24/2015   Procedure: RIGHT ARTHROSCOPY KNEE WITH PARTIAL SYNOVECTOMY AND MEDIAL MENISCECTOMY;  Surgeon: [name], MD;  Location: [facility];  Service: Orthopedics;  Laterality: Right;   LEFT HEART CATH AND CORONARY ANGIOGRAPHY N/A 07/02/2020   Procedure: LEFT HEART CATH AND CORONARY ANGIOGRAPHY;  Surgeon: [name], MD;  Location: [facility];  Service: Cardiovascular;  Laterality: N/A;   ORIF TIBIA PLATEAU Right 12/19/2014   Procedure: OPEN REDUCTION INTERNAL FIXATION (ORIF) RIGHT TIBIAL PLATEAU;  Surgeon: Altamese Cotter, MD;  Location: [facility];  Service: Orthopedics;  Laterality: Right;   TONSILLECTOMY     VASECTOMY      Family History  Problem Relation Age of Onset   Diabetes Mother    CAD Mother    Liver disease Father     Social History   Socioeconomic History   Marital status: Married    Spouse name: Not on file   Number of children: 2   Years of education: Not on file   Highest education level: Not on file  Occupational History   Occupation: disabled  Tobacco Use   Smoking status: Former    Packs/day: 1.00    Pack years: 0.00    Types: Cigarettes    Quit date: 02/20/2007    Years since quitting: 108.7  Smokeless tobacco: Never  Vaping Use   Vaping Use: Never used  Substance and Sexual Activity   Alcohol use: No    Alcohol/week: 0.0 standard drinks    Comment: None in years   Drug use: No   Sexual activity: Yes    Partners: Female  Other Topics Concern   Not on file  Social History Narrative   Pt lives with wife and daughter in [place].    Social Determinants of Health   Financial Resource Strain: Not on file  Food Insecurity: No Food Insecurity   Worried About Charity fundraiser in the Last Year: Never true   Ran Out of Food in the Last Year: Never true  Transportation Needs: No Transportation Needs   Lack of Transportation (Medical): No   Lack of Transportation (Non-Medical): No   Physical Activity: Not on file  Stress: Not on file  Social Connections: Not on file  Intimate Partner Violence: Not on file    Outpatient Medications Prior to Visit  Medication Sig Dispense Refill   amoxicillin (AMOXIL) 875 MG tablet Take 1 tablet (875 mg total) by mouth 2 (two) times daily for 10 days. 20 tablet 0   aspirin EC 81 MG tablet Take 81 mg by mouth daily.     atorvastatin (LIPITOR) 40 MG tablet Take 1 tablet by mouth once daily (Patient taking differently: Take 40 mg by mouth daily.) 90 tablet 2   cholecalciferol (VITAMIN D3) 25 MCG (1000 UNIT) tablet Take 1,000 Units by mouth daily.     Continuous Blood Gluc Sensor (FREESTYLE LIBRE 14 DAY SENSOR) MISC 1 each by Does not apply route every 14 (fourteen) days. 6 each 2   fenofibrate (TRICOR) 145 MG tablet Take 145 mg by mouth daily.     gabapentin (NEURONTIN) 600 MG tablet Take 1 tablet (600 mg total) by mouth in the morning, at noon, in the evening, and at bedtime. 360 tablet 2   guaiFENesin-codeine (CHERATUSSIN AC) 100-10 MG/5ML syrup Take 5 mLs by mouth 3 (three) times daily as needed. 120 mL 0   insulin degludec (TRESIBA FLEXTOUCH) 100 UNIT/ML FlexTouch Pen Inject 40 Units into the skin daily. 15 mL 3   metFORMIN (GLUCOPHAGE) 1000 MG tablet Take 1 tablet by mouth twice daily 180 tablet 2   metoprolol succinate (TOPROL-XL) 25 MG 24 hr tablet Take 1 tablet (25 mg total) by mouth daily. 90 tablet 2   mupirocin ointment (BACTROBAN) 2 % Apply topically 2 (two) times daily. 22 g 6   nitroGLYCERIN (NITROSTAT) 0.4 MG SL tablet Place 1 tablet (0.4 mg total) under the tongue every 5 (five) minutes as needed for chest pain. 25 tablet 6   ONETOUCH VERIO test strip 1 each daily.     Semaglutide,0.25 or 0.5MG/DOS, (OZEMPIC, 0.25 OR 0.5 MG/DOSE,) 2 MG/1.5ML SOPN Inject 0.5 mg into the skin once a week. 3 mL 6   tiZANidine (ZANAFLEX) 2 MG tablet Take 2 mg by mouth at bedtime.     vitamin C (VITAMIN C) 500 MG tablet Take 1 tablet (500 mg  total) by mouth 2 (two) times daily. 60 tablet 2   zolpidem (AMBIEN) 5 MG tablet Take 1 tablet (5 mg total) by mouth at bedtime as needed for sleep. 30 tablet 3   No facility-administered medications prior to visit.    No Known Allergies  ROS Review of Systems  Constitutional:  Negative for chills, fatigue and fever.  HENT:  Negative for congestion, ear pain and sore throat.  Respiratory:  Negative for cough and shortness of breath.   Cardiovascular:  Negative for chest pain.  Gastrointestinal:  Negative for abdominal pain, constipation, diarrhea, nausea and vomiting.  Endocrine: Negative for polydipsia, polyphagia and polyuria.  Genitourinary:  Negative for dysuria and frequency.  Musculoskeletal:  Negative for arthralgias and myalgias.  Neurological:  Negative for dizziness and headaches.  Psychiatric/Behavioral:  Negative for dysphoric mood.        No dysphoria     Objective:    Physical Exam Vitals reviewed.  Constitutional:      Appearance: Normal appearance.  HENT:     Right Ear: Tympanic membrane normal.     Left Ear: Tympanic membrane normal.     Nose: Congestion present.     Mouth/Throat:     Mouth: Mucous membranes are moist.     Pharynx: Oropharynx is clear. Posterior oropharyngeal erythema present.  Eyes:     Extraocular Movements: Extraocular movements intact.     Conjunctiva/sclera: Conjunctivae normal.     Pupils: Pupils are equal, round, and reactive to light.  Cardiovascular:     Rate and Rhythm: Normal rate and regular rhythm.     Pulses: Normal pulses.     Heart sounds: Normal heart sounds. No murmur heard.   No gallop.  Pulmonary:     Effort: Pulmonary effort is normal.     Breath sounds: Rhonchi present.  Musculoskeletal:        General: Normal range of motion.  Skin:    General: Skin is warm.  Neurological:     Mental Status: He is alert.    BP 120/64   Pulse 72   Temp 97.7 F (36.5 C)   Resp 17   Ht _0  (1.651 m)   Wt 190 lb  (86.2 kg)   SpO2 98%   BMI 31.62 kg/m  Wt Readings from Last 3 Encounters:  11/14/20 190 lb (86.2 kg)  11/08/20 191 lb (86.6 kg)  10/03/20 193 lb 3.2 oz (87.6 kg)     Health Maintenance Due  Topic Date Due   Hepatitis C Screening  Never done   TETANUS/TDAP  Never done   COLONOSCOPY (Pts 45-43yr Insurance coverage will need to be confirmed)  Never done   Zoster Vaccines- Shingrix (1 of 2) Never done   OPHTHALMOLOGY EXAM  07/25/2020   COVID-19 Vaccine (4 - Booster for Moderna series) 09/05/2020    There are no preventive care reminders to display for this patient.  Lab Results  Component Value Date   TSH 2.829 12/18/2014   Lab Results  Component Value Date   WBC 6.2 08/29/2020   HGB 15.9 08/29/2020   HCT 47.8 08/29/2020   MCV 84 08/29/2020   PLT 197 08/29/2020   Lab Results  Component Value Date   NA 144 08/29/2020   K 4.9 08/29/2020   CO2 20 08/29/2020   GLUCOSE 125 (H) 08/29/2020   BUN 14 08/29/2020   CREATININE 1.07 08/29/2020   BILITOT 0.7 08/29/2020   ALKPHOS 77 08/29/2020   AST 86 (H) 08/29/2020   ALT 92 (H) 08/29/2020   PROT 7.1 08/29/2020   ALBUMIN 4.7 08/29/2020   CALCIUM 9.5 08/29/2020   ANIONGAP 12 07/24/2015   EGFR 77 08/29/2020   Lab Results  Component Value Date   CHOL 167 08/29/2020   Lab Results  Component Value Date   HDL 39 (L) 08/29/2020   Lab Results  Component Value Date   LDLCALC 111 (H) 08/29/2020   Lab  Results  Component Value Date   TRIG 88 08/29/2020   Lab Results  Component Value Date   CHOLHDL 4.3 08/29/2020   Lab Results  Component Value Date   HGBA1C 8.9 (H) 08/29/2020      Assessment & Plan:  Diagnoses and all orders for this visit: Pharyngitis due to other organism -     POCT rapid strep A -     azithromycin (ZITHROMAX) 250 MG tablet; Take 2 tablets on day 1, then 1 tablet daily on days 2 through 5 -     albuterol (VENTOLIN HFA) 108 (90 Base) MCG/ACT inhaler; Inhale 2 puffs into the lungs every 6 (six)  hours as needed for wheezing or shortness of breath. -     predniSONE (STERAPRED UNI-PAK 21 TAB) 10 MG (21) TBPK tablet; Take 6ills first day , then 5 pills day 2 and then cut down one pill day until gone Patient has positive strep test Bronchial pneumonia  Patient having severe cough suggestive of bronchopneumonia, get chest x-ray.  Sent home on antibiotics, prednisone and albuterol.     Follow-up: Return if symptoms worsen or fail to improve.    Reinaldo Meeker, MD

## 2020-11-29 ENCOUNTER — Ambulatory Visit: Payer: HMO | Admitting: Legal Medicine

## 2020-12-07 ENCOUNTER — Telehealth: Payer: Self-pay

## 2020-12-07 NOTE — Progress Notes (Signed)
Chronic Care Management Pharmacy Assistant   Name: Quantavis Obryant  MRN: [id number] DOB: May 23, 1955   Reason for Encounter: Medication coordination for [facility]  Recent office visits:  11/14/20-Dr Marina Goodell PCP, cough, started Ventolin inhaler, Zpack and Prednisone, follow up PRN   Recent consult visits:  10/03/20-Dr Revankar, cardiology, labs ordered, no medication changes, results of heart cath were reviewed.  Follow up 3mos    Hospital visits:  None in previous 6 months  Medications: Outpatient Encounter Medications as of 12/07/2020  Medication Sig   albuterol (VENTOLIN HFA) 108 (90 Base) MCG/ACT inhaler Inhale 2 puffs into the lungs every 6 (six) hours as needed for wheezing or shortness of breath.   aspirin EC 81 MG tablet Take 81 mg by mouth daily.   atorvastatin (LIPITOR) 40 MG tablet Take 1 tablet by mouth once daily (Patient taking differently: Take 40 mg by mouth daily.)   cholecalciferol (VITAMIN D3) 25 MCG (1000 UNIT) tablet Take 1,000 Units by mouth daily.   Continuous Blood Gluc Sensor (FREESTYLE LIBRE 14 DAY SENSOR) MISC 1 each by Does not apply route every 14 (fourteen) days.   fenofibrate (TRICOR) 145 MG tablet Take 145 mg by mouth daily.   gabapentin (NEURONTIN) 600 MG tablet Take 1 tablet (600 mg total) by mouth in the morning, at noon, in the evening, and at bedtime.   guaiFENesin-codeine (CHERATUSSIN AC) 100-10 MG/5ML syrup Take 5 mLs by mouth 3 (three) times daily as needed.   insulin degludec (TRESIBA FLEXTOUCH) 100 UNIT/ML FlexTouch Pen Inject 40 Units into the skin daily.   metFORMIN (GLUCOPHAGE) 1000 MG tablet Take 1 tablet by mouth twice daily   metoprolol succinate (TOPROL-XL) 25 MG 24 hr tablet Take 1 tablet (25 mg total) by mouth daily.   mupirocin ointment (BACTROBAN) 2 % Apply topically 2 (two) times daily.   nitroGLYCERIN (NITROSTAT) 0.4 MG SL tablet Place 1 tablet (0.4 mg total) under the tongue every 5 (five) minutes as needed for chest pain.   ONETOUCH  VERIO test strip 1 each daily.   predniSONE (STERAPRED UNI-PAK 21 TAB) 10 MG (21) TBPK tablet Take 6ills first day , then 5 pills day 2 and then cut down one pill day until gone   Semaglutide,0.25 or 0.5MG /DOS, (OZEMPIC, 0.25 OR 0.5 MG/DOSE,) 2 MG/1.5ML SOPN Inject 0.5 mg into the skin once a week.   tiZANidine (ZANAFLEX) 2 MG tablet Take 2 mg by mouth at bedtime.   vitamin C (VITAMIN C) 500 MG tablet Take 1 tablet (500 mg total) by mouth 2 (two) times daily.   zolpidem (AMBIEN) 5 MG tablet Take 1 tablet (5 mg total) by mouth at bedtime as needed for sleep.   No facility-administered encounter medications on file as of 12/07/2020.   Reviewed chart for medication changes ahead of medication coordination call.  No OVs, Consults, or hospital visits since last care coordination call/Pharmacist visit. (If appropriate, list visit date, provider name)  No medication changes indicated OR if recent visit, treatment plan here.  BP Readings from Last 3 Encounters:  11/14/20 120/64  11/08/20 140/60  10/03/20 130/78    Lab Results  Component Value Date   HGBA1C 8.9 (H) 08/29/2020     Patient obtains medications through Adherence Packaging  30 Days   Last adherence delivery included:  No previus deliveries have been made  Patient declined (meds) last month due to PRN use/additional supply on hand. No previous deliveries have been made   Patient is due for next adherence delivery on:  12/18/20. Called patient and reviewed medications and coordinated delivery.  This delivery to include: Zolpidem 5mg     at bedtime Gabapentin 600mg    4 times per day Metformin 1000mg    Breakfast and Evening meal Metoprolol 25mg   Evening meal Fenofibrate 145mg   Bedtime Ozempic  0.5mg     weekly  Freestyle sensor Tresiba 40 units daily Nitrostat 0.4mg   PRN    Patient declined the following medications (meds) due to (reason) Atorvastatin 40mg   filled 11/16/20-90ds Test strips filled 11/17/20  Patient needs  refills for . Nitrostat 0.4mg  PRN   Confirmed delivery date of 12/18/20, advised patient that pharmacy will contact them the morning of delivery.  , CMA Clinical Pharmacist Assistant [phone]

## 2020-12-17 ENCOUNTER — Telehealth: Payer: Self-pay

## 2020-12-17 NOTE — Progress Notes (Signed)
    Chronic Care Management Pharmacy Assistant   Name: Mitchell Hammond  MRN: [id number] DOB: 1955/07/12   Reason for Encounter: Medication Review for Ozempic PAP    Medications: Outpatient Encounter Medications as of 12/17/2020  Medication Sig   albuterol (VENTOLIN HFA) 108 (90 Base) MCG/ACT inhaler Inhale 2 puffs into the lungs every 6 (six) hours as needed for wheezing or shortness of breath.   aspirin EC 81 MG tablet Take 81 mg by mouth daily.   atorvastatin (LIPITOR) 40 MG tablet Take 1 tablet by mouth once daily (Patient taking differently: Take 40 mg by mouth daily.)   cholecalciferol (VITAMIN D3) 25 MCG (1000 UNIT) tablet Take 1,000 Units by mouth daily.   Continuous Blood Gluc Sensor (FREESTYLE LIBRE 14 DAY SENSOR) MISC 1 each by Does not apply route every 14 (fourteen) days.   fenofibrate (TRICOR) 145 MG tablet Take 145 mg by mouth daily.   gabapentin (NEURONTIN) 600 MG tablet Take 1 tablet (600 mg total) by mouth in the morning, at noon, in the evening, and at bedtime.   guaiFENesin-codeine (CHERATUSSIN AC) 100-10 MG/5ML syrup Take 5 mLs by mouth 3 (three) times daily as needed.   insulin degludec (TRESIBA FLEXTOUCH) 100 UNIT/ML FlexTouch Pen Inject 40 Units into the skin daily.   metFORMIN (GLUCOPHAGE) 1000 MG tablet Take 1 tablet by mouth twice daily   metoprolol succinate (TOPROL-XL) 25 MG 24 hr tablet Take 1 tablet (25 mg total) by mouth daily.   mupirocin ointment (BACTROBAN) 2 % Apply topically 2 (two) times daily.   nitroGLYCERIN (NITROSTAT) 0.4 MG SL tablet Place 1 tablet (0.4 mg total) under the tongue every 5 (five) minutes as needed for chest pain.   ONETOUCH VERIO test strip 1 each daily.   predniSONE (STERAPRED UNI-PAK 21 TAB) 10 MG (21) TBPK tablet Take 6ills first day , then 5 pills day 2 and then cut down one pill day until gone   Semaglutide,0.25 or 0.5MG /DOS, (OZEMPIC, 0.25 OR 0.5 MG/DOSE,) 2 MG/1.5ML SOPN Inject 0.5 mg into the skin once a week.   tiZANidine  (ZANAFLEX) 2 MG tablet Take 2 mg by mouth at bedtime.   vitamin C (VITAMIN C) 500 MG tablet Take 1 tablet (500 mg total) by mouth 2 (two) times daily.   zolpidem (AMBIEN) 5 MG tablet Take 1 tablet (5 mg total) by mouth at bedtime as needed for sleep.   No facility-administered encounter medications on file as of 12/17/2020.   Lucia Gaskins, CPP requested an Ozempic PAP to be completed for the patient.  Form and supporting documents have been completed and uploaded for signatures and approval.  The patient knows to come by the office on 12/24/20 to sign forms and bring proof of income.    Leilani Able, CMA Clinical Pharmacist Assistant [phone]

## 2021-01-01 ENCOUNTER — Other Ambulatory Visit: Payer: Self-pay | Admitting: Legal Medicine

## 2021-01-01 DIAGNOSIS — E1142 Type 2 diabetes mellitus with diabetic polyneuropathy: Secondary | ICD-10-CM

## 2021-01-07 ENCOUNTER — Telehealth: Payer: Self-pay

## 2021-01-07 NOTE — Chronic Care Management (AMB) (Signed)
Chronic Care Management Pharmacy Assistant   Name: Mitchell Hammond  MRN: [id number] DOB: 09-26-1955   Reason for Encounter: Medication Review/ medication coordination  Recent office visits:  None  Recent consult visits:  None  Hospital visits:  None in previous 6 months  Medications: Outpatient Encounter Medications as of 01/07/2021  Medication Sig   albuterol (VENTOLIN HFA) 108 (90 Base) MCG/ACT inhaler Inhale 2 puffs into the lungs every 6 (six) hours as needed for wheezing or shortness of breath.   aspirin EC 81 MG tablet Take 81 mg by mouth daily.   atorvastatin (LIPITOR) 40 MG tablet Take 1 tablet by mouth once daily (Patient taking differently: Take 40 mg by mouth daily.)   cholecalciferol (VITAMIN D3) 25 MCG (1000 UNIT) tablet Take 1,000 Units by mouth daily.   Continuous Blood Gluc Sensor (FREESTYLE LIBRE 14 DAY SENSOR) MISC 1 each by Does not apply route every 14 (fourteen) days.   fenofibrate (TRICOR) 145 MG tablet Take 145 mg by mouth daily.   gabapentin (NEURONTIN) 600 MG tablet Take 1 tablet (600 mg total) by mouth in the morning, at noon, in the evening, and at bedtime.   guaiFENesin-codeine (CHERATUSSIN AC) 100-10 MG/5ML syrup Take 5 mLs by mouth 3 (three) times daily as needed.   metFORMIN (GLUCOPHAGE) 1000 MG tablet Take 1 tablet by mouth twice daily   metoprolol succinate (TOPROL-XL) 25 MG 24 hr tablet Take 1 tablet (25 mg total) by mouth daily.   mupirocin ointment (BACTROBAN) 2 % Apply topically 2 (two) times daily.   nitroGLYCERIN (NITROSTAT) 0.4 MG SL tablet Place 1 tablet (0.4 mg total) under the tongue every 5 (five) minutes as needed for chest pain.   ONETOUCH VERIO test strip 1 each daily.   predniSONE (STERAPRED UNI-PAK 21 TAB) 10 MG (21) TBPK tablet Take 6ills first day , then 5 pills day 2 and then cut down one pill day until gone   Semaglutide,0.25 or 0.5MG /DOS, (OZEMPIC, 0.25 OR 0.5 MG/DOSE,) 2 MG/1.5ML SOPN Inject 0.5 mg into the skin once a week.    tiZANidine (ZANAFLEX) 2 MG tablet Take 2 mg by mouth at bedtime.   TRESIBA FLEXTOUCH 100 UNIT/ML FlexTouch Pen INJECT 40 UNITS into THE SKIN DAILY   vitamin C (VITAMIN C) 500 MG tablet Take 1 tablet (500 mg total) by mouth 2 (two) times daily.   zolpidem (AMBIEN) 5 MG tablet Take 1 tablet (5 mg total) by mouth at bedtime as needed for sleep.   No facility-administered encounter medications on file as of 01/07/2021.   Reviewed chart for medication changes ahead of medication coordination call.  No OVs, Consults, or hospital visits since last care coordination call/Pharmacist visit. (If appropriate, list visit date, provider name)  No medication changes indicated OR if recent visit, treatment plan here.  BP Readings from Last 3 Encounters:  11/14/20 120/64  11/08/20 140/60  10/03/20 130/78    Lab Results  Component Value Date   HGBA1C 8.9 (H) 08/29/2020     Patient obtains medications through Adherence Packaging  30 Days   Last adherence delivery included:  Zolpidem 5mg     at bedtime Gabapentin 600mg    4 times per day Metformin 1000mg    Breakfast and Evening meal Metoprolol 25mg   Evening meal Fenofibrate 145mg   Bedtime Ozempic  0.5mg     weekly  Freestyle sensor Tresiba 40 units daily Nitrostat 0.4mg   PRN    Patient declined (meds) last month: Atorvastatin 40mg   filled 11/16/20-90ds Test strips filled 11/17/20  Patient  is due for next adherence delivery on: 01-16-2021 432 Called patient and reviewed medications and coordinated delivery.  This delivery to include: Zolpidem 5mg  at bedtime Gabapentin 600mg  breakfast, lunch, evening meals and bedtime Metformin 1000mg   breakfast and Evening meal Metoprolol 25mg   Evening meal Fenofibrate 145mg   Bedtime Ozempic  0.5mg   weekly  Freestyle sensor every 14 days Tresiba 40 units daily  Patient states he just realized Insulin was low and he will be out tomorrow. Acute form sent  Patient declined the following medications: Maybe    No refills needed  Confirmed delivery on 01-16-2021 advised patient that pharmacy will contact them the morning of delivery.  NOTES:  Patient wants to know the copay of Ozempic this month because last month it was 432. This month copay is 218.14. Patient states he has a sample at [facility] but hasn't been able to get it due to falling and not being able to drive. Patient will come to office to pick sample up Monday. Patient assistance form is ready for signature and income verification.  [facility] Clinical Pharmacist Assistant [phone]

## 2021-02-07 ENCOUNTER — Other Ambulatory Visit: Payer: Self-pay

## 2021-02-07 ENCOUNTER — Telehealth: Payer: Self-pay

## 2021-02-07 MED ORDER — ZOLPIDEM TARTRATE 5 MG PO TABS: 5.0000 mg | ORAL_TABLET | Freq: Every evening | ORAL | 3 refills | Status: DC | PRN

## 2021-02-07 NOTE — Chronic Care Management (AMB) (Signed)
Chronic Care Management Pharmacy Assistant   Name: Mitchell Hammond  MRN: [id number] DOB: [birth year]  Reason for Encounter: Medication Review/ Medication Coordination Call   Recent office visits:  None  Recent consult visits:  None  Hospital visits:  None in previous 6 months  Reviewed chart for medication changes ahead of medication coordination call.  No OVs, Consults, or hospital visits since last care coordination call/Pharmacist visit. No medication changes indicated.  BP Readings from Last 3 Encounters:  11/14/20 120/64  11/08/20 140/60  10/03/20 130/78    Lab Results  Component Value Date   HGBA1C 8.9 (H) 08/29/2020     Patient obtains medications through Adherence Packaging  30 Days   Last adherence delivery included:  Zolpidem 5mg - 1 tablet daily (bedtime) Gabapentin 600mg - 1 tablet four times daily ( breakfast, lunch, evening meals and bedtime) Metformin 1000mg - 1 tablet twice daily ( breakfast and Evening meal) Metoprolol 25mg  - 1 tablet daily ( Evening meal) Fenofibrate 145mg  - 1 tablet daily ( Bedtime) Freestyle sensor- use 1 sensor every 14 days Tresiba- Inject 40 units into skin once daily  Patient declined the following medication last month: Ozempic due to cost, patient assistance in progress.   Patient is due for next adherence delivery on: 02/14/2021. 02/07/2021- Called patient twice to review medications and coordinate delivery, no answer left message to return call.  Short fill of Tresiba- Inject 40 units into skin once daily, prior to adherence delivery sent 02/06/2021.   Patient needs refills for Fenofibrate, Zolpidem- request sent to PCP team.  Unable to speak with patient, pharmacy contacted patient and will deliver medications once confirmed.   Medications: Outpatient Encounter Medications as of 02/07/2021  Medication Sig   albuterol (VENTOLIN HFA) 108 (90 Base) MCG/ACT inhaler Inhale 2 puffs into the lungs every 6 (six) hours as needed  for wheezing or shortness of breath.   aspirin EC 81 MG tablet Take 81 mg by mouth daily.   atorvastatin (LIPITOR) 40 MG tablet Take 1 tablet by mouth once daily (Patient taking differently: Take 40 mg by mouth daily.)   cholecalciferol (VITAMIN D3) 25 MCG (1000 UNIT) tablet Take 1,000 Units by mouth daily.   Continuous Blood Gluc Sensor (FREESTYLE LIBRE 14 DAY SENSOR) MISC 1 each by Does not apply route every 14 (fourteen) days.   fenofibrate (TRICOR) 145 MG tablet Take 145 mg by mouth daily.   gabapentin (NEURONTIN) 600 MG tablet Take 1 tablet (600 mg total) by mouth in the morning, at noon, in the evening, and at bedtime.   guaiFENesin-codeine (CHERATUSSIN AC) 100-10 MG/5ML syrup Take 5 mLs by mouth 3 (three) times daily as needed.   metFORMIN (GLUCOPHAGE) 1000 MG tablet Take 1 tablet by mouth twice daily   metoprolol succinate (TOPROL-XL) 25 MG 24 hr tablet Take 1 tablet (25 mg total) by mouth daily.   mupirocin ointment (BACTROBAN) 2 % Apply topically 2 (two) times daily.   nitroGLYCERIN (NITROSTAT) 0.4 MG SL tablet Place 1 tablet (0.4 mg total) under the tongue every 5 (five) minutes as needed for chest pain.   ONETOUCH VERIO test strip 1 each daily.   predniSONE (STERAPRED UNI-PAK 21 TAB) 10 MG (21) TBPK tablet Take 6ills first day , then 5 pills day 2 and then cut down one pill day until gone   Semaglutide,0.25 or 0.5MG /DOS, (OZEMPIC, 0.25 OR 0.5 MG/DOSE,) 2 MG/1.5ML SOPN Inject 0.5 mg into the skin once a week.   tiZANidine (ZANAFLEX) 2 MG tablet Take 2 mg by  mouth at bedtime.   TRESIBA FLEXTOUCH 100 UNIT/ML FlexTouch Pen INJECT 40 UNITS into THE SKIN DAILY   vitamin C (VITAMIN C) 500 MG tablet Take 1 tablet (500 mg total) by mouth 2 (two) times daily.   zolpidem (AMBIEN) 5 MG tablet Take 1 tablet (5 mg total) by mouth at bedtime as needed for sleep.   No facility-administered encounter medications on file as of 02/07/2021.   Billee Cashing, CMA Clinical Pharmacist  Assistant [phone]

## 2021-02-11 ENCOUNTER — Other Ambulatory Visit: Payer: Self-pay | Admitting: Legal Medicine

## 2021-03-03 ENCOUNTER — Other Ambulatory Visit: Payer: Self-pay | Admitting: Legal Medicine

## 2021-03-03 DIAGNOSIS — E088 Diabetes mellitus due to underlying condition with unspecified complications: Secondary | ICD-10-CM

## 2021-03-06 ENCOUNTER — Telehealth: Payer: Self-pay

## 2021-03-06 ENCOUNTER — Other Ambulatory Visit: Payer: Self-pay | Admitting: Family Medicine

## 2021-03-06 DIAGNOSIS — J028 Acute pharyngitis due to other specified organisms: Secondary | ICD-10-CM

## 2021-03-06 DIAGNOSIS — I25118 Atherosclerotic heart disease of native coronary artery with other forms of angina pectoris: Secondary | ICD-10-CM

## 2021-03-06 MED ORDER — ALBUTEROL SULFATE HFA 108 (90 BASE) MCG/ACT IN AERS: 2.0000 | INHALATION_SPRAY | Freq: Four times a day (QID) | RESPIRATORY_TRACT | 3 refills | Status: DC | PRN

## 2021-03-06 MED ORDER — NITROGLYCERIN 0.4 MG SL SUBL: 0.4000 mg | SUBLINGUAL_TABLET | SUBLINGUAL | 1 refills | Status: DC | PRN

## 2021-03-06 NOTE — Chronic Care Management (AMB) (Signed)
Chronic Care Management Pharmacy Assistant   Name: Mitchell Hammond  MRN: [id number] DOB: 07-29-1955   Reason for Encounter: Medication Coordination Call for Upstream    Recent office visits:  None since 02/07/21  Recent consult visits:  None since 02/07/21  Hospital visits:  None since 02/07/21  Medications: Outpatient Encounter Medications as of 03/06/2021  Medication Sig   albuterol (VENTOLIN HFA) 108 (90 Base) MCG/ACT inhaler Inhale 2 puffs into the lungs every 6 (six) hours as needed for wheezing or shortness of breath.   aspirin EC 81 MG tablet Take 81 mg by mouth daily.   atorvastatin (LIPITOR) 40 MG tablet Take 1 tablet by mouth once daily (Patient taking differently: Take 40 mg by mouth daily.)   cholecalciferol (VITAMIN D3) 25 MCG (1000 UNIT) tablet Take 1,000 Units by mouth daily.   Continuous Blood Gluc Sensor (FREESTYLE LIBRE 14 DAY SENSOR) MISC 1 each by Does not apply route every 14 (fourteen) days.   fenofibrate (TRICOR) 145 MG tablet TAKE ONE TABLET BY MOUTH EVERYDAY AT BEDTIME   gabapentin (NEURONTIN) 600 MG tablet TAKE ONE TABLET BY MOUTH FOUR TIMES DAILY   guaiFENesin-codeine (CHERATUSSIN AC) 100-10 MG/5ML syrup Take 5 mLs by mouth 3 (three) times daily as needed.   metFORMIN (GLUCOPHAGE) 1000 MG tablet Take 1 tablet by mouth twice daily   metoprolol succinate (TOPROL-XL) 25 MG 24 hr tablet Take 1 tablet (25 mg total) by mouth daily.   mupirocin ointment (BACTROBAN) 2 % Apply topically 2 (two) times daily.   nitroGLYCERIN (NITROSTAT) 0.4 MG SL tablet Place 1 tablet (0.4 mg total) under the tongue every 5 (five) minutes as needed for chest pain.   ONETOUCH VERIO test strip 1 each daily.   predniSONE (STERAPRED UNI-PAK 21 TAB) 10 MG (21) TBPK tablet Take 6ills first day , then 5 pills day 2 and then cut down one pill day until gone   Semaglutide,0.25 or 0.5MG /DOS, (OZEMPIC, 0.25 OR 0.5 MG/DOSE,) 2 MG/1.5ML SOPN Inject 0.5 mg into the skin once a week.   tiZANidine  (ZANAFLEX) 2 MG tablet Take 2 mg by mouth at bedtime.   TRESIBA FLEXTOUCH 100 UNIT/ML FlexTouch Pen INJECT 40 UNITS into THE SKIN DAILY   vitamin C (VITAMIN C) 500 MG tablet Take 1 tablet (500 mg total) by mouth 2 (two) times daily.   zolpidem (AMBIEN) 5 MG tablet Take 1 tablet (5 mg total) by mouth at bedtime as needed for sleep.   No facility-administered encounter medications on file as of 03/06/2021.   Reviewed chart for medication changes ahead of medication coordination call.  No OVs, Consults, or hospital visits since last care coordination call/Pharmacist visit. (If appropriate, list visit date, provider name)  No medication changes indicated OR if recent visit, treatment plan here.  BP Readings from Last 3 Encounters:  11/14/20 120/64  11/08/20 140/60  10/03/20 130/78    Lab Results  Component Value Date   HGBA1C 8.9 (H) 08/29/2020     Patient obtains medications through Adherence Packaging  30 Days   Last adherence delivery included:  Zolpidem 5mg - 1 tablet daily (bedtime) Gabapentin 600mg - 1 tablet four times daily ( breakfast, lunch, evening meals and bedtime) Metformin 1000mg - 1 tablet twice daily ( breakfast and Evening meal) Metoprolol 25mg  - 1 tablet daily ( Evening meal) Fenofibrate 145mg  - 1 tablet daily ( Bedtime) Freestyle sensor- use 1 sensor every 14 days Tresiba- Inject 40 units into skin once daily  Patient declined (meds) last month:None   Patient  is due for next adherence delivery on: 03/18/21 . Called patient and reviewed medications and coordinated delivery.  This delivery to include: Zolpidem 5mg - 1 tablet daily (bedtime) Gabapentin 600mg - 1 tablet four times daily ( breakfast, lunch, evening meals and bedtime) Metformin 1000mg - 1 tablet twice daily ( breakfast and Evening meal) Metoprolol 25mg  - 1 tablet daily ( Evening meal) Fenofibrate 145mg  - 1 tablet daily ( Bedtime) Freestyle sensor- use 1 sensor every 14 days Tresiba 100units- Inject  40 units into skin once daily Atorvastatin 40 mg 1 at bedtime Albuterol Inhaler 108 2 puffs into the lungs every 6 (six) hours as needed  Nitroglycerin 0.4 1 tablet (0.4 mg total) under the tongue every 5 (five) minutes as needed  Meds on list not included in delivery: Prednisone 10 mg (Completed Course) Guaifenesin-Codeine 100-10 mg (Completed Course) Test Strips- Pt is good until next deliver  Mupirocin 2%- Pt does not need  Ozempic 0.25- Under Patient Assistance  Tizanidine 2 mg - Not taking anymore  Vitamin D - Pt gets OTC  Vitamin C 500 mg- Pt gets OTC Aspirin 81 mg- Pt gets OTC   Patient will need a short fill of (Tresiba 100 units), prior to adherence delivery. (To align with sync date or if PRN med) Pt only has a week left on insulin   Patient needs refills: Sent provider a refill request Nitroglycerin 0.4 mg Albuterol 108 inhaler  Confirmed delivery date of 03/18/21, advised patient that pharmacy will contact them the morning of delivery.   , CMA Clinical Pharmacist Assistant  [phone]

## 2021-03-07 NOTE — Telephone Encounter (Signed)
Compliant on meds, f/u 1 month 

## 2021-03-20 ENCOUNTER — Other Ambulatory Visit: Payer: Self-pay | Admitting: Legal Medicine

## 2021-03-20 ENCOUNTER — Telehealth: Payer: Self-pay

## 2021-03-20 MED ORDER — AMOXICILLIN 875 MG PO TABS: 875.0000 mg | ORAL_TABLET | Freq: Two times a day (BID) | ORAL | 0 refills | Status: AC

## 2021-03-20 NOTE — Telephone Encounter (Signed)
Patient called stating that he has a broken tooth.  He made an appointment with the dentist however can't be seen for three weeks.  Patient states that they told him to call PCP to get an ABT for infection because he is diabetic until he can be seen.  Please call in to [facility] on [address]

## 2021-03-20 NOTE — Telephone Encounter (Signed)
Sent in lp 

## 2021-03-25 NOTE — Telephone Encounter (Signed)
Patient was informed. He did pick up the antibiotic.

## 2021-04-04 ENCOUNTER — Telehealth: Payer: Self-pay

## 2021-04-04 NOTE — Chronic Care Management (AMB) (Signed)
Chronic Care Management Pharmacy Assistant   Name: Mitchell Hammond  MRN: [id number] DOB: 03-26-1956   Reason for Encounter: Medication Coordination for Upstream     Recent office visits:  03/20/21 Orders Only. Brent Bulla MD. Ordered Amoxicillin 875 mg for 10 days.   Recent consult visits:  None   Hospital visits:  None   Medications: Outpatient Encounter Medications as of 04/04/2021  Medication Sig   albuterol (VENTOLIN HFA) 108 (90 Base) MCG/ACT inhaler Inhale 2 puffs into the lungs every 6 (six) hours as needed for wheezing or shortness of breath.   aspirin EC 81 MG tablet Take 81 mg by mouth daily.   atorvastatin (LIPITOR) 40 MG tablet Take 1 tablet by mouth once daily (Patient taking differently: Take 40 mg by mouth daily.)   cholecalciferol (VITAMIN D3) 25 MCG (1000 UNIT) tablet Take 1,000 Units by mouth daily.   Continuous Blood Gluc Sensor (FREESTYLE LIBRE 14 DAY SENSOR) MISC 1 each by Does not apply route every 14 (fourteen) days.   fenofibrate (TRICOR) 145 MG tablet TAKE ONE TABLET BY MOUTH EVERYDAY AT BEDTIME   gabapentin (NEURONTIN) 600 MG tablet TAKE ONE TABLET BY MOUTH FOUR TIMES DAILY   guaiFENesin-codeine (CHERATUSSIN AC) 100-10 MG/5ML syrup Take 5 mLs by mouth 3 (three) times daily as needed.   metFORMIN (GLUCOPHAGE) 1000 MG tablet Take 1 tablet by mouth twice daily   metoprolol succinate (TOPROL-XL) 25 MG 24 hr tablet Take 1 tablet (25 mg total) by mouth daily.   mupirocin ointment (BACTROBAN) 2 % Apply topically 2 (two) times daily.   nitroGLYCERIN (NITROSTAT) 0.4 MG SL tablet Place 1 tablet (0.4 mg total) under the tongue every 5 (five) minutes as needed for chest pain.   ONETOUCH VERIO test strip 1 each daily.   predniSONE (STERAPRED UNI-PAK 21 TAB) 10 MG (21) TBPK tablet Take 6ills first day , then 5 pills day 2 and then cut down one pill day until gone   Semaglutide,0.25 or 0.5MG /DOS, (OZEMPIC, 0.25 OR 0.5 MG/DOSE,) 2 MG/1.5ML SOPN Inject 0.5 mg into the  skin once a week.   tiZANidine (ZANAFLEX) 2 MG tablet Take 2 mg by mouth at bedtime.   TRESIBA FLEXTOUCH 100 UNIT/ML FlexTouch Pen INJECT 40 UNITS into THE SKIN DAILY   vitamin C (VITAMIN C) 500 MG tablet Take 1 tablet (500 mg total) by mouth 2 (two) times daily.   zolpidem (AMBIEN) 5 MG tablet Take 1 tablet (5 mg total) by mouth at bedtime as needed for sleep.   No facility-administered encounter medications on file as of 04/04/2021.   Reviewed chart for medication changes ahead of medication coordination call.  No OVs, Consults, or hospital visits since last care coordination call/Pharmacist visit. (If appropriate, list visit date, provider name)  No medication changes indicated OR if recent visit, treatment plan here.  BP Readings from Last 3 Encounters:  11/14/20 120/64  11/08/20 140/60  10/03/20 130/78    Lab Results  Component Value Date   HGBA1C 8.9 (H) 08/29/2020     Patient obtains medications through Adherence Packaging  30 Days   Last adherence delivery included:  Zolpidem 5mg - 1 tablet daily (bedtime) Gabapentin 600mg - 1 tablet four times daily ( breakfast, lunch, evening meals and bedtime) Metformin 1000mg - 1 tablet twice daily ( breakfast and Evening meal) Metoprolol 25mg  - 1 tablet daily ( Evening meal) Fenofibrate 145mg  - 1 tablet daily ( Bedtime) Freestyle sensor- use 1 sensor every 14 days Tresiba 100units- Inject 40 units into skin once  daily Atorvastatin 40 mg 1 at bedtime Albuterol Inhaler 108 2 puffs into the lungs every 6 (six) hours as needed  Nitroglycerin 0.4 1 tablet (0.4 mg total) under the tongue every 5 (five) minutes as needed  Patient declined (meds) last month  Prednisone 10 mg (Completed Course) Guaifenesin-Codeine 100-10 mg (Completed Course) Test Strips- Pt is good until next deliver  Mupirocin 2%- Pt does not need  Ozempic 0.25- Under Patient Assistance  Tizanidine 2 mg - Not taking anymore  Vitamin D - Pt gets OTC  Vitamin C 500  mg- Pt gets OTC Aspirin 81 mg- Pt gets OTC  Patient is due for next adherence delivery on: 04/16/21 . Called patient and reviewed medications and coordinated delivery.  This delivery to include: Gabapentin 600 mg 1 B 1L 1 EM 1 BT Zolpidem 5 mg 1 at bedtime prn (Bottles) Fenofibrate 145 mg 1 BT Metformin 1000 mg 1 B 1 EM Atorvastatin 40 mg 1 BT Metoprolol ER 25 mg 1 EM Freestyle Sensors change every 14 days Tresiba Flextouch 100 units Inject 40 units daily  Nitroglycerin 0.4 mg 1 every 5 mins prn  Albuterol Inhaler 2 puffs every 6 hours prn Test Strips Onetouch Verio  Bactroban 2% ointment   Patient declined the following medications Ozempic 2 mg Gets PAP  Patient needs refills  Test Strips Onetouch Verio  Bactroban 2% ointment   Confirmed delivery date of 04/16/21, advised patient that pharmacy will contact them the morning of delivery.   Roxana Hires, CMA Clinical Pharmacist Assistant  [phone]

## 2021-04-05 ENCOUNTER — Other Ambulatory Visit: Payer: Self-pay

## 2021-04-05 DIAGNOSIS — L739 Follicular disorder, unspecified: Secondary | ICD-10-CM

## 2021-04-05 MED ORDER — ONETOUCH VERIO VI STRP: 1.0000 | ORAL_STRIP | Freq: Every day | 2 refills | Status: DC

## 2021-04-05 MED ORDER — MUPIROCIN 2 % EX OINT: TOPICAL_OINTMENT | Freq: Two times a day (BID) | CUTANEOUS | 6 refills | Status: DC

## 2021-04-05 NOTE — Telephone Encounter (Signed)
Compliant on meds, f/u 1 month 

## 2021-04-24 ENCOUNTER — Encounter: Payer: Self-pay | Admitting: Legal Medicine

## 2021-04-24 ENCOUNTER — Ambulatory Visit (INDEPENDENT_AMBULATORY_CARE_PROVIDER_SITE_OTHER): Payer: HMO | Admitting: Legal Medicine

## 2021-04-24 ENCOUNTER — Other Ambulatory Visit: Payer: Self-pay

## 2021-04-24 VITALS — BP 140/64 | HR 60 | Temp 98.1°F | Resp 16 | Ht 65.0 in | Wt 195.0 lb

## 2021-04-24 DIAGNOSIS — E782 Mixed hyperlipidemia: Secondary | ICD-10-CM | POA: Diagnosis not present

## 2021-04-24 DIAGNOSIS — I10 Essential (primary) hypertension: Secondary | ICD-10-CM

## 2021-04-24 DIAGNOSIS — Z6831 Body mass index (BMI) 31.0-31.9, adult: Secondary | ICD-10-CM | POA: Insufficient documentation

## 2021-04-24 DIAGNOSIS — E559 Vitamin D deficiency, unspecified: Secondary | ICD-10-CM

## 2021-04-24 DIAGNOSIS — E1142 Type 2 diabetes mellitus with diabetic polyneuropathy: Secondary | ICD-10-CM

## 2021-04-24 DIAGNOSIS — I2510 Atherosclerotic heart disease of native coronary artery without angina pectoris: Secondary | ICD-10-CM | POA: Diagnosis not present

## 2021-04-24 DIAGNOSIS — Z6832 Body mass index (BMI) 32.0-32.9, adult: Secondary | ICD-10-CM | POA: Insufficient documentation

## 2021-04-24 MED ORDER — PREGABALIN 75 MG PO CAPS: 75.0000 mg | ORAL_CAPSULE | Freq: Two times a day (BID) | ORAL | 5 refills | Status: DC

## 2021-04-24 MED ORDER — OZEMPIC (0.25 OR 0.5 MG/DOSE) 2 MG/1.5ML ~~LOC~~ SOPN: 0.5000 mg | PEN_INJECTOR | SUBCUTANEOUS | 6 refills | Status: DC

## 2021-04-24 NOTE — Progress Notes (Signed)
Subjective:  Patient ID: Mitchell Hammond, male    DOB: [birth year]  Age: 65 y.o. MRN: [id number]  Chief Complaint  Patient presents with   Diabetes   Hypertension   Hyperlipidemia    HPI: chronic visit  Patient present with type 2 diabetes.  Specifically, this is type 2, insulin requiring diabetes, complicated by neuropathy.  Compliance with treatment has been good; patient take medicines as directed, maintains diet and exercise regimen, follows up as directed, and is keeping glucose diary.  Date of  diagnosis 2010.  Depression screen has been performed.Tobacco screen nonsmoker. Current medicines for diabetes tresiba 40 units, not taking ozempic.  Patient is on none for renal protection and atorvastatin for cholesterol control.  Patient performs foot exams daily and last ophthalmologic exam was had checked.   Patient presents for follow up of hypertension.  Patient tolerating metoprolol well with side effects.  Patient was diagnosed with hypertension 2010 so has been treated for hypertension for 12 years.Patient is working on maintaining diet and exercise regimen and follows up as directed. Complication include none.   Patient presents with hyperlipidemia.  Compliance with treatment has been good; patient takes medicines as directed, maintains low cholesterol diet, follows up as directed, and maintains exercise regimen.  Patient is using atorvastatin without problems.    Current Outpatient Medications on File Prior to Visit  Medication Sig Dispense Refill   albuterol (VENTOLIN HFA) 108 (90 Base) MCG/ACT inhaler Inhale 2 puffs into the lungs every 6 (six) hours as needed for wheezing or shortness of breath. 8 g 3   aspirin EC 81 MG tablet Take 81 mg by mouth daily.     atorvastatin (LIPITOR) 40 MG tablet Take 1 tablet by mouth once daily (Patient taking differently: Take 40 mg by mouth daily.) 90 tablet 2   cholecalciferol (VITAMIN D3) 25 MCG (1000 UNIT) tablet Take 1,000 Units by mouth daily.      Continuous Blood Gluc Sensor (FREESTYLE LIBRE 14 DAY SENSOR) MISC 1 each by Does not apply route every 14 (fourteen) days. 6 each 2   fenofibrate (TRICOR) 145 MG tablet TAKE ONE TABLET BY MOUTH EVERYDAY AT BEDTIME 90 tablet 2   gabapentin (NEURONTIN) 600 MG tablet TAKE ONE TABLET BY MOUTH FOUR TIMES DAILY 360 tablet 5   guaiFENesin-codeine (CHERATUSSIN AC) 100-10 MG/5ML syrup Take 5 mLs by mouth 3 (three) times daily as needed. 120 mL 0   metFORMIN (GLUCOPHAGE) 1000 MG tablet Take 1 tablet by mouth twice daily 180 tablet 2   metoprolol succinate (TOPROL-XL) 25 MG 24 hr tablet Take 1 tablet (25 mg total) by mouth daily. 90 tablet 2   mupirocin ointment (BACTROBAN) 2 % Apply topically 2 (two) times daily. 22 g 6   nitroGLYCERIN (NITROSTAT) 0.4 MG SL tablet Place 1 tablet (0.4 mg total) under the tongue every 5 (five) minutes as needed for chest pain. 25 tablet 1   ONETOUCH VERIO test strip 1 each by Other route daily. 100 each 2   TRESIBA FLEXTOUCH 100 UNIT/ML FlexTouch Pen INJECT 40 UNITS into THE SKIN DAILY 15 mL 3   vitamin C (VITAMIN C) 500 MG tablet Take 1 tablet (500 mg total) by mouth 2 (two) times daily. 60 tablet 2   zolpidem (AMBIEN) 5 MG tablet Take 1 tablet (5 mg total) by mouth at bedtime as needed for sleep. 30 tablet 3   No current facility-administered medications on file prior to visit.   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 07/05/2019   Diabetes mellitus due to underlying condition with unspecified complications (HCC) 12/26/2015   Diabetic polyneuropathy associated with type 2 diabetes mellitus (HCC) 08/20/2016   Discitis    Essential hypertension 12/26/2015   Fracture, tibial plateau 12/18/2014   Hypertension    IDDM  (insulin dependent diabetes mellitus)    type 2   Metatarsalgia of both feet 08/20/2016   MI (myocardial infarction) (HCC)    x 2, Multi-link Vision 3.5 x 15 mm BMS in 2009 (mid LAD), balloon 2015   Mixed hyperlipidemia 06/14/2020   Obesity (BMI 30.0-34.9) 06/26/2020   Tibial plateau fracture 12/18/2014   Vitamin D insufficiency    Past Surgical History:  Procedure Laterality Date   BACK SURGERY     2014   CARDIAC CATHETERIZATION     x 2, 2009 & 2015   COLONOSCOPY     CORONARY ANGIOPLASTY     stent - 2008, balloon - 2015   FASCIOTOMY Right 12/19/2014   Procedure: ANTERIOR COMPARTMENTAL FASCIOTOMY;  Surgeon: Myrene Galas, MD;  Location: [facility] OR;  Service: Orthopedics;  Laterality: Right;   HAND SURGERY Left    HARDWARE REMOVAL Right 07/24/2015   Procedure: HARDWARE REMOVAL RIGHT TIBIA;  Surgeon: Myrene Galas, MD;  Location: [facility] OR;  Service: Orthopedics;  Laterality: Right;   KNEE ARTHROSCOPY Right 07/24/2015   Procedure: RIGHT ARTHROSCOPY KNEE WITH PARTIAL SYNOVECTOMY AND MEDIAL MENISCECTOMY;  Surgeon: Myrene Galas, MD;  Location: [facility] OR;  Service: Orthopedics;  Laterality: Right;   LEFT HEART CATH AND CORONARY ANGIOGRAPHY N/A 07/02/2020   Procedure: LEFT HEART CATH AND CORONARY ANGIOGRAPHY;  Surgeon: Lennette Bihari, MD;  Location: [facility] IN[facility] LAB;  Service: Cardiovascular;  Laterality: N/A;   ORIF TIBIA PLATEAU Right 12/19/2014   Procedure: OPEN REDUCTION INTERNAL FIXATION (ORIF) RIGHT TIBIAL PLATEAU;  Surgeon: Myrene Galas, MD;  Location: [facility] OR;  Service: Orthopedics;  Laterality: Right;   TONSILLECTOMY     VASECTOMY      Family History  Problem Relation Age of Onset   Diabetes Mother    CAD Mother    Liver disease Father    Social History   Socioeconomic History   Marital status: Married    Spouse name: Not on file   Number of children: 2   Years of education: Not on file   Highest education level: Not on file  Occupational History   Occupation: disabled  Tobacco Use    Smoking status: Former    Packs/day: 1.00    Types: Cigarettes    Quit date: 02/20/2007    Years since quitting: 14.1   Smokeless tobacco: Never  Vaping Use   Vaping Use: Never used  Substance and Sexual Activity   Alcohol use: No    Alcohol/week: 0.0 standard drinks    Comment: None in years   Drug use: No   Sexual activity: Yes    Partners: Female  Other Topics Concern   Not on file  Social History Narrative   Pt lives with wife and daughter in [place].    Social Determinants of Corporate investment banker Strain: Not on file  Food Insecurity: No Food Insecurity   Worried About Running Out of Food in the Last Year:  Never true   Ran Out of Food in the Last Year: Never true  Transportation Needs: No Transportation Needs   Lack of Transportation (Medical): No   Lack of Transportation (Non-Medical): No  Physical Activity: Not on file  Stress: Not on file  Social Connections: Not on file    Review of Systems  Constitutional:  Negative for chills, fatigue, fever and unexpected weight change.  HENT:  Negative for congestion, ear pain, sinus pain and sore throat.   Respiratory:  Negative for cough and shortness of breath.   Cardiovascular:  Negative for chest pain and palpitations.  Gastrointestinal:  Negative for abdominal pain, blood in stool, constipation, diarrhea, nausea and vomiting.  Endocrine: Negative for polydipsia.  Genitourinary:  Negative for dysuria.  Musculoskeletal:  Negative for back pain.  Skin:  Negative for rash.  Neurological:  Negative for headaches.    Objective:  BP 140/64   Pulse 60   Temp 98.1 F (36.7 C)   Resp 16   Ht 5\' 5"  (1.651 m)   Wt 195 lb (88.5 kg)   SpO2 98%   BMI 32.45 kg/m   BP/Weight 04/24/2021 11/14/2020 11/08/2020  Systolic BP 140 120 140  Diastolic BP 64 64 60  Wt. (Lbs) 195 190 191  BMI 32.45 31.62 31.78    Physical Exam Vitals reviewed.  Constitutional:      General: He is not in acute distress.    Appearance:  Normal appearance. He is obese.  HENT:     Right Ear: Tympanic membrane normal.     Left Ear: Tympanic membrane normal.     Nose: Nose normal.     Mouth/Throat:     Mouth: Mucous membranes are moist.     Pharynx: Oropharynx is clear.  Eyes:     Extraocular Movements: Extraocular movements intact.     Conjunctiva/sclera: Conjunctivae normal.     Pupils: Pupils are equal, round, and reactive to light.  Cardiovascular:     Rate and Rhythm: Normal rate and regular rhythm.     Pulses: Normal pulses.     Heart sounds: No murmur heard.   No gallop.  Pulmonary:     Effort: Pulmonary effort is normal. No respiratory distress.     Breath sounds: No wheezing.  Abdominal:     General: Abdomen is flat. Bowel sounds are normal. There is no distension.     Tenderness: There is no abdominal tenderness.  Musculoskeletal:     Cervical back: Normal range of motion.  Skin:    General: Skin is warm.     Capillary Refill: Capillary refill takes less than 2 seconds.  Neurological:     General: No focal deficit present.     Mental Status: He is alert. Mental status is at baseline.     Sensory: Sensory deficit (feet) present.  Psychiatric:        Mood and Affect: Mood normal.        Thought Content: Thought content normal.    Diabetic Foot Exam - Simple   Simple Foot Form Diabetic Foot exam was performed with the following findings: Yes 04/24/2021  3:09 PM  Visual Inspection No deformities, no ulcerations, no other skin breakdown bilaterally: Yes Sensation Testing See comments: Yes Pulse Check Posterior Tibialis and Dorsalis pulse intact bilaterally: Yes Comments Decreased sensation      Lab Results  Component Value Date   WBC 6.2 08/29/2020   HGB 15.9 08/29/2020   HCT 47.8 08/29/2020   PLT 197 08/29/2020  GLUCOSE 125 (H) 08/29/2020   CHOL 167 08/29/2020   TRIG 88 08/29/2020   HDL 39 (L) 08/29/2020   LDLCALC 111 (H) 08/29/2020   ALT 92 (H) 08/29/2020   AST 86 (H) 08/29/2020    NA 144 08/29/2020   K 4.9 08/29/2020   CL 107 (H) 08/29/2020   CREATININE 1.07 08/29/2020   BUN 14 08/29/2020   CO2 20 08/29/2020   TSH 2.829 12/18/2014   INR 1.20 12/18/2014   HGBA1C 8.9 (H) 08/29/2020   MICROALBUR 80 06/14/2020      Assessment & Plan:   Problem List Items Addressed This Visit       Cardiovascular and Mediastinum   CAD (coronary artery disease) - Primary An individual plan was formulated based on patient history and exam, labs and evidence based data. Patient has not had recent angina or nitroglycerin use. continue present treatment.     Essential hypertension   Relevant Orders   Comprehensive metabolic panel   CBC with Differential/Platelet An individual hypertension care plan was established and reinforced today.  The patient's status was assessed using clinical findings on exam and labs or diagnostic tests. The patient's success at meeting treatment goals on disease specific evidence-based guidelines and found to be well controlled. SELF MANAGEMENT: The patient and I together assessed ways to personally work towards obtaining the recommended goals. RECOMMENDATIONS: avoid decongestants found in common cold remedies, decrease consumption of alcohol, perform routine monitoring of BP with home BP cuff, exercise, reduction of dietary salt, take medicines as prescribed, try not to miss doses and quit smoking.  Regular exercise and maintaining a healthy weight is needed.  Stress reduction may help. A CLINICAL SUMMARY including written plan identify barriers to care unique to individual due to social or financial issues.  We attempt to mutually creat solutions for individual and family understanding.      Endocrine   Diabetic polyneuropathy associated with type 2 diabetes mellitus (HCC)   Relevant Medications   Semaglutide,0.25 or 0.5MG /DOS, (OZEMPIC, 0.25 OR 0.5 MG/DOSE,) 2 MG/1.5ML SOPN   pregabalin (LYRICA) 75 MG capsule   Other Relevant Orders   Hemoglobin  A1c An individual care plan for diabetes was established and reinforced today.  The patient's status was assessed using clinical findings on exam, labs and diagnostic testing. Patient success at meeting goals based on disease specific evidence-based guidelines and found to be good controlled. Medications were assessed and patient's understanding of the medical issues , including barriers were assessed. Recommend adherence to a diabetic diet, a graduated exercise program, HgbA1c level is checked quarterly, and urine microalbumin performed yearly .  Annual mono-filament sensation testing performed. Lower blood pressure and control hyperlipidemia is important. Get annual eye exams and annual flu shots and smoking cessation discussed.  Self management goals were discussed.      Other   Vitamin D insufficiency   Relevant Orders   Vitamin D, 25-hydroxy Patient has low vitamin D   Mixed hyperlipidemia   Relevant Orders   Lipid panel AN INDIVIDUAL CARE PLAN for hyperlipidemia/ cholesterol was established and reinforced today.  The patient's status was assessed using clinical findings on exam, lab and other diagnostic tests. The patient's disease status was assessed based on evidence-based guidelines and found to be well controlled. MEDICATIONS were reviewed. SELF MANAGEMENT GOALS have been discussed and patient's success at attaining the goal of low cholesterol was assessed. RECOMMENDATION given include regular exercise 3 days a week and low cholesterol/low fat diet. CLINICAL SUMMARY including written plan  to identify barriers unique to the patient due to social or economic  reasons was discussed.    BMI 32.0-32.9,adult An individualize plan was formulated for obesity using patient history and physical exam to encourage weight loss.  An evidence based program was formulated.  Patient is to cut portion size with meals and to plan physical exercise 3 days a week at least 20 minutes.  Weight watchers and  other programs are helpful.  Planned amount of weight loss 10 lbs.   .  Meds ordered this encounter  Medications   Semaglutide,0.25 or 0.5MG /DOS, (OZEMPIC, 0.25 OR 0.5 MG/DOSE,) 2 MG/1.5ML SOPN    Sig: Inject 0.5 mg into the skin once a week.    Dispense:  3 mL    Refill:  6   pregabalin (LYRICA) 75 MG capsule    Sig: Take 1 capsule (75 mg total) by mouth 2 (two) times daily.    Dispense:  60 capsule    Refill:  5     Orders Placed This Encounter  Procedures   Comprehensive metabolic panel   Hemoglobin A1c   Lipid panel   CBC with Differential/Platelet   Vitamin D, 25-hydroxy  30 minute visit, review of records    Follow-up: Return in about 4 months (around 08/23/2021) for fasting.  An After Visit Summary was printed and given to the patient.  Brent Bulla, MD [facility] [phone]

## 2021-04-25 LAB — CBC WITH DIFFERENTIAL/PLATELET
Basophils Absolute: 0.1 10*3/uL (ref 0.0–0.2)
Basos: 1 %
EOS (ABSOLUTE): 0.1 10*3/uL (ref 0.0–0.4)
Eos: 2 %
Hematocrit: 44.6 % (ref 37.5–51.0)
Hemoglobin: 14.8 g/dL (ref 13.0–17.7)
Immature Grans (Abs): 0 10*3/uL (ref 0.0–0.1)
Immature Granulocytes: 0 %
Lymphocytes Absolute: 1.1 10*3/uL (ref 0.7–3.1)
Lymphs: 14 %
MCH: 28.8 pg (ref 26.6–33.0)
MCHC: 33.2 g/dL (ref 31.5–35.7)
MCV: 87 fL (ref 79–97)
Monocytes Absolute: 0.5 10*3/uL (ref 0.1–0.9)
Monocytes: 7 %
Neutrophils Absolute: 5.9 10*3/uL (ref 1.4–7.0)
Neutrophils: 76 %
Platelets: 191 10*3/uL (ref 150–450)
RBC: 5.14 x10E6/uL (ref 4.14–5.80)
RDW: 13 % (ref 11.6–15.4)
WBC: 7.8 10*3/uL (ref 3.4–10.8)

## 2021-04-25 LAB — LIPID PANEL
Chol/HDL Ratio: 3.5 ratio (ref 0.0–5.0)
Cholesterol, Total: 125 mg/dL (ref 100–199)
HDL: 36 mg/dL — ABNORMAL LOW (ref >39)
LDL Chol Calc (NIH): 75 mg/dL (ref 0–99)
Triglycerides: 65 mg/dL (ref 0–149)
VLDL Cholesterol Cal: 14 mg/dL (ref 5–40)

## 2021-04-25 LAB — COMPREHENSIVE METABOLIC PANEL
ALT: 34 IU/L (ref 0–44)
AST: 30 IU/L (ref 0–40)
Albumin/Globulin Ratio: 2.4 — ABNORMAL HIGH (ref 1.2–2.2)
Albumin: 4.4 g/dL (ref 3.8–4.8)
Alkaline Phosphatase: 66 IU/L (ref 44–121)
BUN/Creatinine Ratio: 13 (ref 10–24)
BUN: 13 mg/dL (ref 8–27)
Bilirubin Total: 0.7 mg/dL (ref 0.0–1.2)
CO2: 22 mmol/L (ref 20–29)
Calcium: 9.4 mg/dL (ref 8.6–10.2)
Chloride: 106 mmol/L (ref 96–106)
Creatinine, Ser: 1 mg/dL (ref 0.76–1.27)
Globulin, Total: 1.8 g/dL (ref 1.5–4.5)
Glucose: 180 mg/dL — ABNORMAL HIGH (ref 70–99)
Potassium: 4.4 mmol/L (ref 3.5–5.2)
Sodium: 141 mmol/L (ref 134–144)
Total Protein: 6.2 g/dL (ref 6.0–8.5)
eGFR: 82 mL/min/{1.73_m2} (ref >59)

## 2021-04-25 LAB — HEMOGLOBIN A1C
Est. average glucose Bld gHb Est-mCnc: 214 mg/dL
Hgb A1c MFr Bld: 9.1 % — ABNORMAL HIGH (ref 4.8–5.6)

## 2021-04-25 LAB — VITAMIN D 25 HYDROXY (VIT D DEFICIENCY, FRACTURES): Vit D, 25-Hydroxy: 30.6 ng/mL (ref 30.0–100.0)

## 2021-04-25 LAB — CARDIOVASCULAR RISK ASSESSMENT

## 2021-04-25 NOTE — Progress Notes (Signed)
Glucose 180, kidney tests normal, liver tests normal, A1c 9.1,high, need < 8, increase ozempic dose 0.5mg ,  cholesterol normal, CBC normal, vitamin D 30.6 normal,  lp

## 2021-05-07 ENCOUNTER — Telehealth: Payer: Self-pay

## 2021-05-07 NOTE — Progress Notes (Signed)
Chronic Care Management Pharmacy Assistant   Name: Mitchell Hammond  MRN: [id number] DOB: 1956-03-27   Reason for Encounter: Medication Coordination for [facility]    Recent office visits:  04/24/21 Brent Bulla MD. Seen for CAD. Started on Pregabalin 75 mg 2 times daily. D/C Prednisone 10 mg and Tizanidine HCI 2 mg.   Recent consult visits:  None   Hospital visits:  None   Medications: Outpatient Encounter Medications as of 05/07/2021  Medication Sig   albuterol (VENTOLIN HFA) 108 (90 Base) MCG/ACT inhaler Inhale 2 puffs into the lungs every 6 (six) hours as needed for wheezing or shortness of breath.   aspirin EC 81 MG tablet Take 81 mg by mouth daily.   atorvastatin (LIPITOR) 40 MG tablet Take 1 tablet by mouth once daily (Patient taking differently: Take 40 mg by mouth daily.)   cholecalciferol (VITAMIN D3) 25 MCG (1000 UNIT) tablet Take 1,000 Units by mouth daily.   Continuous Blood Gluc Sensor (FREESTYLE LIBRE 14 DAY SENSOR) MISC 1 each by Does not apply route every 14 (fourteen) days.   fenofibrate (TRICOR) 145 MG tablet TAKE ONE TABLET BY MOUTH EVERYDAY AT BEDTIME   gabapentin (NEURONTIN) 600 MG tablet TAKE ONE TABLET BY MOUTH FOUR TIMES DAILY   guaiFENesin-codeine (CHERATUSSIN AC) 100-10 MG/5ML syrup Take 5 mLs by mouth 3 (three) times daily as needed.   metFORMIN (GLUCOPHAGE) 1000 MG tablet Take 1 tablet by mouth twice daily   metoprolol succinate (TOPROL-XL) 25 MG 24 hr tablet Take 1 tablet (25 mg total) by mouth daily.   mupirocin ointment (BACTROBAN) 2 % Apply topically 2 (two) times daily.   nitroGLYCERIN (NITROSTAT) 0.4 MG SL tablet Place 1 tablet (0.4 mg total) under the tongue every 5 (five) minutes as needed for chest pain.   ONETOUCH VERIO test strip 1 each by Other route daily.   pregabalin (LYRICA) 75 MG capsule Take 1 capsule (75 mg total) by mouth 2 (two) times daily.   Semaglutide,0.25 or 0.5MG /DOS, (OZEMPIC, 0.25 OR 0.5 MG/DOSE,) 2 MG/1.5ML SOPN Inject 0.5  mg into the skin once a week.   TRESIBA FLEXTOUCH 100 UNIT/ML FlexTouch Pen INJECT 40 UNITS into THE SKIN DAILY   vitamin C (VITAMIN C) 500 MG tablet Take 1 tablet (500 mg total) by mouth 2 (two) times daily.   zolpidem (AMBIEN) 5 MG tablet Take 1 tablet (5 mg total) by mouth at bedtime as needed for sleep.   No facility-administered encounter medications on file as of 05/07/2021.    Reviewed chart for medication changes ahead of medication coordination call.  No OVs, Consults, or hospital visits since last care coordination call/Pharmacist visit. (If appropriate, list visit date, provider name)  No medication changes indicated OR if recent visit, treatment plan here.  BP Readings from Last 3 Encounters:  04/24/21 140/64  11/14/20 120/64  11/08/20 140/60    Lab Results  Component Value Date   HGBA1C 9.1 (H) 04/24/2021     Patient obtains medications through Adherence Packaging  30 Days   Last adherence delivery included:  Gabapentin 600 mg 1 B 1L 1 EM 1 BT Zolpidem 5 mg 1 at bedtime prn (Bottles) Fenofibrate 145 mg 1 BT Metformin 1000 mg 1 B 1 EM Atorvastatin 40 mg 1 BT Metoprolol ER 25 mg 1 EM Freestyle Sensors change every 14 days Tresiba Flextouch 100 units Inject 40 units daily  Nitroglycerin 0.4 mg 1 every 5 mins prn  Albuterol Inhaler 2 puffs every 6 hours prn Test Strips Onetouch  Verio  Bactroban 2% ointment   Patient declined (meds) last month  Ozempic 2 mg Gets PAP  Patient is due for next adherence delivery on: 05/17/21. Called patient and reviewed medications and coordinated delivery.  This delivery to include: Gabapentin 600 mg 1 B 1L 1 EM 1 BT Zolpidem 5 mg 1 at bedtime prn (Bottles) Fenofibrate 145 mg 1 BT Metformin 1000 mg 1 B 1 EM Atorvastatin 40 mg 1 BT Metoprolol ER 25 mg 1 EM Freestyle Sensors change every 14 days Tresiba Flextouch 100 units Inject 40 units daily  Nitroglycerin 0.4 mg 1 every 5 mins prn  Albuterol Inhaler 2 puffs every 6  hours prn Bactroban 2% ointment   Patient declined the following medications Test Strips Onetouch Verio Not due until 05/29/21 Ozempic 2 mg Gets PAP  Patient needs refills for - Sent provider a refill request  Tresiba Flextouch 100 units. Metformin 1000 mg Atorvastatin 40 mg Nitroglycerin 0.4 mg   Acute fill for the following: Test Strips Onetouch Verio Not due until 05/29/21  Confirmed delivery date of 05/17/21, advised patient that pharmacy will contact them the morning of delivery.  Roxana Hires, CMA Clinical Pharmacist Assistant  [phone]   I have made several attempts at calling pt. I was unsuccessful. We will coordinate the delivery as scheduled.

## 2021-05-09 ENCOUNTER — Other Ambulatory Visit: Payer: Self-pay

## 2021-05-09 DIAGNOSIS — E088 Diabetes mellitus due to underlying condition with unspecified complications: Secondary | ICD-10-CM

## 2021-05-09 DIAGNOSIS — I25118 Atherosclerotic heart disease of native coronary artery with other forms of angina pectoris: Secondary | ICD-10-CM

## 2021-05-09 DIAGNOSIS — E785 Hyperlipidemia, unspecified: Secondary | ICD-10-CM

## 2021-05-09 DIAGNOSIS — E1142 Type 2 diabetes mellitus with diabetic polyneuropathy: Secondary | ICD-10-CM

## 2021-05-09 MED ORDER — ATORVASTATIN CALCIUM 40 MG PO TABS: 40.0000 mg | ORAL_TABLET | Freq: Every day | ORAL | 2 refills | Status: DC

## 2021-05-09 MED ORDER — NITROGLYCERIN 0.4 MG SL SUBL: 0.4000 mg | SUBLINGUAL_TABLET | SUBLINGUAL | 1 refills | Status: DC | PRN

## 2021-05-09 MED ORDER — METFORMIN HCL 1000 MG PO TABS: 1000.0000 mg | ORAL_TABLET | Freq: Two times a day (BID) | ORAL | 2 refills | Status: DC

## 2021-05-09 MED ORDER — TRESIBA FLEXTOUCH 100 UNIT/ML ~~LOC~~ SOPN: 40.0000 [IU] | PEN_INJECTOR | Freq: Every day | SUBCUTANEOUS | 3 refills | Status: DC

## 2021-05-15 ENCOUNTER — Telehealth: Payer: Self-pay

## 2021-05-15 ENCOUNTER — Other Ambulatory Visit: Payer: Self-pay

## 2021-05-15 NOTE — Chronic Care Management (AMB) (Addendum)
° ° °  Chronic Care Management Pharmacy Assistant   Name: Mitchell Hammond  MRN: [id number] DOB: May 25, 1955   Reason for Encounter: Medication Review/ patient concerns    Medications: Outpatient Encounter Medications as of 05/15/2021  Medication Sig   albuterol (VENTOLIN HFA) 108 (90 Base) MCG/ACT inhaler Inhale 2 puffs into the lungs every 6 (six) hours as needed for wheezing or shortness of breath.   aspirin EC 81 MG tablet Take 81 mg by mouth daily.   atorvastatin (LIPITOR) 40 MG tablet Take 1 tablet (40 mg total) by mouth daily.   cholecalciferol (VITAMIN D3) 25 MCG (1000 UNIT) tablet Take 1,000 Units by mouth daily.   Continuous Blood Gluc Sensor (FREESTYLE LIBRE 14 DAY SENSOR) MISC 1 each by Does not apply route every 14 (fourteen) days.   fenofibrate (TRICOR) 145 MG tablet TAKE ONE TABLET BY MOUTH EVERYDAY AT BEDTIME   gabapentin (NEURONTIN) 600 MG tablet TAKE ONE TABLET BY MOUTH FOUR TIMES DAILY   guaiFENesin-codeine (CHERATUSSIN AC) 100-10 MG/5ML syrup Take 5 mLs by mouth 3 (three) times daily as needed.   insulin degludec (TRESIBA FLEXTOUCH) 100 UNIT/ML FlexTouch Pen Inject 40 Units into the skin daily.   metFORMIN (GLUCOPHAGE) 1000 MG tablet Take 1 tablet (1,000 mg total) by mouth 2 (two) times daily.   metoprolol succinate (TOPROL-XL) 25 MG 24 hr tablet Take 1 tablet (25 mg total) by mouth daily.   mupirocin ointment (BACTROBAN) 2 % Apply topically 2 (two) times daily.   nitroGLYCERIN (NITROSTAT) 0.4 MG SL tablet Place 1 tablet (0.4 mg total) under the tongue every 5 (five) minutes as needed for chest pain.   ONETOUCH VERIO test strip 1 each by Other route daily.   pregabalin (LYRICA) 75 MG capsule Take 1 capsule (75 mg total) by mouth 2 (two) times daily.   Semaglutide,0.25 or 0.5MG /DOS, (OZEMPIC, 0.25 OR 0.5 MG/DOSE,) 2 MG/1.5ML SOPN Inject 0.5 mg into the skin once a week.   vitamin C (VITAMIN C) 500 MG tablet Take 1 tablet (500 mg total) by mouth 2 (two) times daily.   zolpidem  (AMBIEN) 5 MG tablet Take 1 tablet (5 mg total) by mouth at bedtime as needed for sleep.   No facility-administered encounter medications on file as of 05/15/2021.   05-15-2021: Patient contacted [facility] pharmacy wanting to know if he should take gabapentin and lyrica together. Pharmacist stated medications were written by 2 different doctors but in epic it only shows PCP prescribed both medications. Sent message to [name] CMA [name] to clarify.  05-16-2021: CMA [name] response "Patient did not want to take lyrica. He did not feel good after he took it. He wants to continue with gabapentin". Left patient a voicemail to return call just to make sure everything is ok.   [name] [facility] Clinical Pharmacist Assistant [phone]

## 2021-05-21 ENCOUNTER — Other Ambulatory Visit: Payer: Self-pay

## 2021-05-21 DIAGNOSIS — E1142 Type 2 diabetes mellitus with diabetic polyneuropathy: Secondary | ICD-10-CM

## 2021-05-21 MED ORDER — TRESIBA FLEXTOUCH 100 UNIT/ML ~~LOC~~ SOPN: 40.0000 [IU] | PEN_INJECTOR | Freq: Every day | SUBCUTANEOUS | 3 refills | Status: DC

## 2021-05-28 ENCOUNTER — Telehealth: Payer: Self-pay

## 2021-05-28 NOTE — Progress Notes (Signed)
° ° °  Chronic Care Management Pharmacy Assistant   Name: Ocean Schildt  MRN: [id number] DOB: 1955/06/29   Reason for Encounter: I spoke with pt in regards to him wanted his medications through [facility] instead of staying with [facility]. Pt explained to me that when I call him to do his sync call, he was unable to speak with me due to his wife having surgery. He stated he called back while I was on vacation and spoke with someone that he could not recall their name. He stated they informed him that his Evaristo Bury was on back order and could not deliver until a few days after his delivery date. Pt confirmed he did get his delivery in December 2022 but not his insulin. He stated he received a call asking if he would be ok to go a few days without his insulin until they can deliver it. Pt agreed it was ok. He stated come that delivery he never received his delivery and stated he went a week without his medication and then finally asked Dr. Sedalia Muta to send it to [facility] instead.   I sincerely apologized for what happened and I understand his frustration. He stated he cannot go without his insulin and felt like the ball dropped with this. He stated he may consider coming back in the future because he thinks we do provider good service but he is ok with [facility] for now.     Medications: Outpatient Encounter Medications as of 05/28/2021  Medication Sig   albuterol (VENTOLIN HFA) 108 (90 Base) MCG/ACT inhaler Inhale 2 puffs into the lungs every 6 (six) hours as needed for wheezing or shortness of breath.   aspirin EC 81 MG tablet Take 81 mg by mouth daily.   atorvastatin (LIPITOR) 40 MG tablet Take 1 tablet (40 mg total) by mouth daily.   cholecalciferol (VITAMIN D3) 25 MCG (1000 UNIT) tablet Take 1,000 Units by mouth daily.   Continuous Blood Gluc Sensor (FREESTYLE LIBRE 14 DAY SENSOR) MISC 1 each by Does not apply route every 14 (fourteen) days.   fenofibrate (TRICOR) 145 MG tablet TAKE ONE TABLET BY MOUTH EVERYDAY  AT BEDTIME   gabapentin (NEURONTIN) 600 MG tablet TAKE ONE TABLET BY MOUTH FOUR TIMES DAILY   guaiFENesin-codeine (CHERATUSSIN AC) 100-10 MG/5ML syrup Take 5 mLs by mouth 3 (three) times daily as needed.   insulin degludec (TRESIBA FLEXTOUCH) 100 UNIT/ML FlexTouch Pen Inject 40 Units into the skin daily.   metFORMIN (GLUCOPHAGE) 1000 MG tablet Take 1 tablet (1,000 mg total) by mouth 2 (two) times daily.   metoprolol succinate (TOPROL-XL) 25 MG 24 hr tablet Take 1 tablet (25 mg total) by mouth daily.   mupirocin ointment (BACTROBAN) 2 % Apply topically 2 (two) times daily.   nitroGLYCERIN (NITROSTAT) 0.4 MG SL tablet Place 1 tablet (0.4 mg total) under the tongue every 5 (five) minutes as needed for chest pain.   ONETOUCH VERIO test strip 1 each by Other route daily.   Semaglutide,0.25 or 0.5MG /DOS, (OZEMPIC, 0.25 OR 0.5 MG/DOSE,) 2 MG/1.5ML SOPN Inject 0.5 mg into the skin once a week.   vitamin C (VITAMIN C) 500 MG tablet Take 1 tablet (500 mg total) by mouth 2 (two) times daily.   zolpidem (AMBIEN) 5 MG tablet Take 1 tablet (5 mg total) by mouth at bedtime as needed for sleep.   No facility-administered encounter medications on file as of 05/28/2021.   Roxana Hires, CMA Clinical Pharmacist Assistant  [phone]

## 2021-06-18 ENCOUNTER — Other Ambulatory Visit: Payer: Self-pay

## 2021-06-18 DIAGNOSIS — E1142 Type 2 diabetes mellitus with diabetic polyneuropathy: Secondary | ICD-10-CM

## 2021-06-18 MED ORDER — OZEMPIC (0.25 OR 0.5 MG/DOSE) 2 MG/1.5ML ~~LOC~~ SOPN: 0.5000 mg | PEN_INJECTOR | SUBCUTANEOUS | 6 refills | Status: DC

## 2021-06-18 NOTE — Telephone Encounter (Signed)
Change in pharmacy. In process of getting PAP.   Lorita Officer, CCMA 06/18/21 9:01 AM

## 2021-08-04 ENCOUNTER — Other Ambulatory Visit: Payer: Self-pay | Admitting: Legal Medicine

## 2021-09-24 ENCOUNTER — Other Ambulatory Visit: Payer: Self-pay | Admitting: Legal Medicine

## 2021-09-24 DIAGNOSIS — E1142 Type 2 diabetes mellitus with diabetic polyneuropathy: Secondary | ICD-10-CM

## 2021-09-26 ENCOUNTER — Other Ambulatory Visit: Payer: Self-pay

## 2021-09-26 DIAGNOSIS — E1142 Type 2 diabetes mellitus with diabetic polyneuropathy: Secondary | ICD-10-CM

## 2021-09-26 MED ORDER — TRESIBA FLEXTOUCH 100 UNIT/ML ~~LOC~~ SOPN: PEN_INJECTOR | SUBCUTANEOUS | 3 refills | Status: DC

## 2021-09-27 ENCOUNTER — Telehealth: Payer: Self-pay

## 2021-09-27 NOTE — Chronic Care Management (AMB) (Signed)
    Chronic Care Management Pharmacy Assistant   Name: Mitchell Hammond  MRN: [id number] DOB: [birth year]  Reason for Encounter: Disease State/Diabetes  Recent office visits:  04-24-2021 Abigail Miyamoto, MD. Glucose= 180, Albumin/Globuline= 2.4. A1C= 9.1. HDL= 36. START lyrica 75 mg twice daily. STOP prednisone and tizanidine.  Recent consult visits:  None  Hospital visits:  None in previous 6 months  Medications: Outpatient Encounter Medications as of 09/27/2021  Medication Sig   albuterol (VENTOLIN HFA) 108 (90 Base) MCG/ACT inhaler Inhale 2 puffs into the lungs every 6 (six) hours as needed for wheezing or shortness of breath.   aspirin EC 81 MG tablet Take 81 mg by mouth daily.   atorvastatin (LIPITOR) 40 MG tablet Take 1 tablet (40 mg total) by mouth daily.   cholecalciferol (VITAMIN D3) 25 MCG (1000 UNIT) tablet Take 1,000 Units by mouth daily.   Continuous Blood Gluc Sensor (FREESTYLE LIBRE 14 DAY SENSOR) MISC USE AS DIRECTED EVERY  14  DAYS   fenofibrate (TRICOR) 145 MG tablet TAKE ONE TABLET BY MOUTH EVERYDAY AT BEDTIME   gabapentin (NEURONTIN) 600 MG tablet TAKE ONE TABLET BY MOUTH FOUR TIMES DAILY   guaiFENesin-codeine (CHERATUSSIN AC) 100-10 MG/5ML syrup Take 5 mLs by mouth 3 (three) times daily as needed.   insulin degludec (TRESIBA FLEXTOUCH) 100 UNIT/ML FlexTouch Pen INJECT 40 UNITS SUBCUTANEOUSLY ONCE DAILY   metFORMIN (GLUCOPHAGE) 1000 MG tablet Take 1 tablet (1,000 mg total) by mouth 2 (two) times daily.   metoprolol succinate (TOPROL-XL) 25 MG 24 hr tablet Take 1 tablet (25 mg total) by mouth daily.   mupirocin ointment (BACTROBAN) 2 % Apply topically 2 (two) times daily.   nitroGLYCERIN (NITROSTAT) 0.4 MG SL tablet Place 1 tablet (0.4 mg total) under the tongue every 5 (five) minutes as needed for chest pain.   ONETOUCH VERIO test strip 1 each by Other route daily.   Semaglutide,0.25 or 0.5MG /DOS, (OZEMPIC, 0.25 OR 0.5 MG/DOSE,) 2 MG/1.5ML SOPN Inject 0.5 mg into  the skin once a week.   vitamin C (VITAMIN C) 500 MG tablet Take 1 tablet (500 mg total) by mouth 2 (two) times daily.   zolpidem (AMBIEN) 5 MG tablet Take 1 tablet (5 mg total) by mouth at bedtime as needed for sleep.   No facility-administered encounter medications on file as of 09/27/2021.  Recent Relevant Labs: Lab Results  Component Value Date/Time   HGBA1C 9.1 (H) 04/24/2021 09:12 AM   HGBA1C 8.9 (H) 08/29/2020 10:59 AM   MICROALBUR 80 06/14/2020 09:41 AM    Kidney Function Lab Results  Component Value Date/Time   CREATININE 1.00 04/24/2021 09:12 AM   CREATININE 1.07 08/29/2020 10:59 AM   GFRNONAA 63 06/26/2020 04:41 PM   GFRAA 72 06/26/2020 04:41 PM    09-27-2021: 1st attempt left VM 10-02-2021: 2nd attempt left VM 10-07-2021 3rd attempt left VM  Star Rating Drugs: Metformin 1000 mg- Last filled 05-10-2021 30 DS Atorvastatin 40 mg- Last filled 05-10-2021 30 DS Ozempic 0.5 mg- Last filled 06-10-2021 90 DS  Malecca [facility] CMA Clinical Pharmacist Assistant [phone]

## 2021-10-08 NOTE — Telephone Encounter (Signed)
Set up follow up appointment for dm with provider of choice lp

## 2021-10-08 NOTE — Telephone Encounter (Signed)
Attempted to call patient, unsuccessful but [facility].  Royce Macadamia, [place] 10/08/21 5:08 PM

## 2021-10-08 NOTE — Telephone Encounter (Signed)
Diabetes (A1c goal <7%) Lab Results  Component Value Date   HGBA1C 9.1 (H) 04/24/2021   HGBA1C 8.9 (H) 08/29/2020   HGBA1C 9.7 (H) 06/14/2020   Lab Results  Component Value Date   MICROALBUR 80 06/14/2020   LDLCALC 75 04/24/2021   CREATININE 1.00 04/24/2021    Lab Results  Component Value Date   NA 141 04/24/2021   K 4.4 04/24/2021   CREATININE 1.00 04/24/2021   EGFR 82 04/24/2021   GFRNONAA 63 06/26/2020   GLUCOSE 180 (H) 04/24/2021    Lab Results  Component Value Date   WBC 7.8 04/24/2021   HGB 14.8 04/24/2021   HCT 44.6 04/24/2021   MCV 87 04/24/2021   PLT 191 04/24/2021   -Uncontrolled -Current medications: Tresiba 40 units Appropriate, Query effective, Safe, Accessible Metformin 1026m BID Appropriate, Query effective, Safe, Accessible Ozempic 0.550mAppropriate, Query effective, Safe, Accessible -Medications previously tried: N/A  -Current home glucose readings fasting glucose:  May 2023: Freestyle 7 day average: 166 post prandial glucose: N/a -Denies hypoglycemic/hyperglycemic symptoms -Current meal patterns:  -Current exercise: N/A -Educated on A1c and blood sugar goals; -Counseled to check feet daily and get yearly eye exams May 2023: Patient was busy outside working, will let PCP know and scheduled f/u with patient for next month -Requires ACE/ARB

## 2021-10-10 NOTE — Telephone Encounter (Signed)
Attempted to call patient, unsuccessful but [facility] Lorita Officer, CCMA 10/10/21 12:06 PM

## 2021-10-11 NOTE — Telephone Encounter (Signed)
Attempted to call patient, unsuccessful but [facility].  Royce Macadamia, [place] 10/11/21 11:08 AM

## 2021-10-17 ENCOUNTER — Telehealth: Payer: Self-pay

## 2021-10-17 NOTE — Chronic Care Management (AMB) (Signed)
[employer] patient assistance program notification:  120- day supply of Ozempic 0.25/.05 mg will be filled on 11/07/2021 and should arrive to the office in 10-14 business days. Patient enrollment will expire on 04/17/2022.  Billee Cashing, CMA Clinical Pharmacist Assistant [phone]

## 2021-10-28 ENCOUNTER — Telehealth: Payer: HMO

## 2021-11-14 ENCOUNTER — Telehealth: Payer: Self-pay

## 2021-11-14 ENCOUNTER — Ambulatory Visit (INDEPENDENT_AMBULATORY_CARE_PROVIDER_SITE_OTHER): Payer: HMO

## 2021-11-14 NOTE — Progress Notes (Signed)
Chronic Care Management Pharmacy Note  11/14/2021 Name:  Mitchell Hammond MRN:  [id number] DOB:  December 05, 1955   Summary:  Pleasant 66 year old male presents for f/u CCM visit. He is currently in the pool with his granddaughter. He moved here from [place] 30 years ago. He enjoyed the [employer] but hasn't been as much of a fan recently due to their bad luck Was using [facility] but has issues getting insulin in December 2022 due to shortage, went back to [place] (See note 05/28/21)  Plan Updates:  Patient needs f/u with PCP ASAP Patient requires ACE/ARB  Subjective: Mitchell Hammond is an 66 y.o. year old male who is a primary patient of Henrene Pastor, Zeb Comfort, MD.  The CCM team was consulted for assistance with disease management and care coordination needs.    Engaged with patient face to face for follow up visit in response to provider referral for pharmacy case management and/or care coordination services.   Consent to Services:  The patient was given the following information about Chronic Care Management services today, agreed to services, and gave verbal consent: 1. CCM service includes personalized support from designated clinical staff supervised by the primary care provider, including individualized plan of care and coordination with other care providers 2. 24/7 contact phone numbers for assistance for urgent and routine care needs. 3. Service will only be billed when office clinical staff spend 20 minutes or more in a month to coordinate care. 4. Only one practitioner may furnish and bill the service in a calendar month. 5.The patient may stop CCM services at any time (effective at the end of the month) by phone call to the office staff. 6. The patient will be responsible for cost sharing (co-pay) of up to 20% of the service fee (after annual deductible is met). Patient agreed to services and consent obtained.  Patient Care Team: Lillard Anes, MD as PCP - General (Family  Medicine) Lane Hacker, [facility] (Pharmacist)  Recent office visits: 08/29/2020 - Cbc normal, glucose 125, kidney tests normal, liver tests remain elevated, consider ultrasound liver, a1c 8.9 very high, LDL cholesterol high at 111, use  your new medicines 07/24/2020 - vascular ultrasound ordered.  06/14/2020 - CBC normal, glucose 173, kidney tests normal, liver tests elevated- we will follow, A1c 9.7, stop glipizide, nurse visit to start ozempic or trulicity, Cholesterol good Recent consult visits: 09/06/2020 - Urgent care for respiratory infection. Steroid and doxycycline.  07/02/2020 - heart catherization  06/26/2020 - cardio - CAD - symptoms are concerning. Ordered left heart catherization. Encourage weight loss and improved blood sugar.  04/23/2020 - cough/congestion - tessalon, mucinex and loratadine.  04/09/2020 - urgent care - knee and back pain - kenalog shot.   Hospital visits: None in previous 6 months  Objective:  Lab Results  Component Value Date   CREATININE 1.00 04/24/2021   BUN 13 04/24/2021   GFRNONAA 63 06/26/2020   GFRAA 72 06/26/2020   NA 141 04/24/2021   K 4.4 04/24/2021   CALCIUM 9.4 04/24/2021   CO2 22 04/24/2021   GLUCOSE 180 (H) 04/24/2021    Lab Results  Component Value Date/Time   HGBA1C 9.1 (H) 04/24/2021 09:12 AM   HGBA1C 8.9 (H) 08/29/2020 10:59 AM   MICROALBUR 80 06/14/2020 09:41 AM    Last diabetic Eye exam: No results found for: "HMDIABEYEEXA"  Last diabetic Foot exam: No results found for: "HMDIABFOOTEX"   Lab Results  Component Value Date   CHOL 125 04/24/2021   HDL  36 (L) 04/24/2021   LDLCALC 75 04/24/2021   TRIG 65 04/24/2021   CHOLHDL 3.5 04/24/2021       Latest Ref Rng & Units 04/24/2021    9:12 AM 08/29/2020   10:59 AM 06/14/2020    8:35 AM  Hepatic Function  Total Protein 6.0 - 8.5 g/dL 6.2  7.1  6.7   Albumin 3.8 - 4.8 g/dL 4.4  4.7  4.5   AST 0 - 40 IU/L 30  86  61   ALT 0 - 44 IU/L 34  92  97   Alk Phosphatase 44 -  121 IU/L 66  77  87   Total Bilirubin 0.0 - 1.2 mg/dL 0.7  0.7  0.7     Lab Results  Component Value Date/Time   TSH 2.829 12/18/2014 11:46 AM       Latest Ref Rng & Units 04/24/2021    9:12 AM 08/29/2020   10:59 AM 06/26/2020    4:41 PM  CBC  WBC 3.4 - 10.8 x10E3/uL 7.8  6.2  6.6   Hemoglobin 13.0 - 17.7 g/dL 14.8  15.9  15.1   Hematocrit 37.5 - 51.0 % 44.6  47.8  44.4   Platelets 150 - 450 x10E3/uL 191  197  197     Lab Results  Component Value Date/Time   VD25OH 30.6 04/24/2021 09:12 AM   VD25OH 22.8 (L) 12/18/2014 11:46 AM    Clinical ASCVD: Yes  The ASCVD Risk score (Arnett DK, et al., 2019) failed to calculate for the following reasons:   The patient has a prior MI or stroke diagnosis       07/24/2020    9:32 AM 07/20/2019    8:12 AM  Depression screen PHQ 2/9  Decreased Interest 2 0  Down, Depressed, Hopeless 1 0  PHQ - 2 Score 3 0  Altered sleeping 3   Tired, decreased energy 1   Change in appetite 0   Feeling bad or failure about yourself  0   Trouble concentrating 1   Moving slowly or fidgety/restless 0   Suicidal thoughts 0   PHQ-9 Score 8   Difficult doing work/chores Somewhat difficult      Social History   Tobacco Use  Smoking Status Former   Packs/day: 1.00   Types: Cigarettes   Quit date: 02/20/2007   Years since quitting: 14.7  Smokeless Tobacco Never   BP Readings from Last 3 Encounters:  04/24/21 140/64  11/14/20 120/64  11/08/20 140/60   Pulse Readings from Last 3 Encounters:  04/24/21 60  11/14/20 72  11/08/20 (!) 102   Wt Readings from Last 3 Encounters:  04/24/21 195 lb (88.5 kg)  11/14/20 190 lb (86.2 kg)  11/08/20 191 lb (86.6 kg)   BMI Readings from Last 3 Encounters:  04/24/21 32.45 kg/m  11/14/20 31.62 kg/m  11/08/20 31.78 kg/m    Assessment/Interventions: Review of patient past medical history, allergies, medications, health status, including review of consultants reports, laboratory and other test data, was  performed as part of comprehensive evaluation and provision of chronic care management services.   SDOH:  (Social Determinants of Health) assessments and interventions performed: Yes SDOH Interventions    Flowsheet Row Most Recent Value  SDOH Interventions   Financial Strain Interventions Intervention Not Indicated  Transportation Interventions Intervention Not Indicated      SDOH Screenings   Alcohol Screen: Low Risk  (07/20/2019)   Alcohol Screen    Last Alcohol Screening Score (AUDIT): 0  Depression (PHQ2-9): Medium Risk (07/24/2020)   Depression (PHQ2-9)    PHQ-2 Score: 8  Financial Resource Strain: Low Risk  (11/14/2021)   Overall Financial Resource Strain (CARDIA)    Difficulty of Paying Living Expenses: Not hard at all  Food Insecurity: No Food Insecurity (08/29/2020)   Hunger Vital Sign    Worried About Running Out of Food in the Last Year: Never true    Ran Out of Food in the Last Year: Never true  Housing: Low Risk  (08/29/2020)   Housing    Last Housing Risk Score: 0  Physical Activity: Not on file  Social Connections: Not on file  Stress: Not on file  Tobacco Use: Medium Risk (04/24/2021)   Patient History    Smoking Tobacco Use: Former    Smokeless Tobacco Use: Never    Passive Exposure: Not on file  Transportation Needs: No Transportation Needs (11/14/2021)   PRAPARE - Hydrologist (Medical): No    Lack of Transportation (Non-Medical): No    CCM Care Plan  No Known Allergies  Medications Reviewed Today     Reviewed by Lane Hacker, [facility] (Pharmacist) on 11/14/21 at 31  Med List Status: <None>   Medication Order Taking? Sig Documenting Provider Last Dose Status Informant  albuterol (VENTOLIN HFA) 108 (90 Base) MCG/ACT inhaler [id number]  Inhale 2 puffs into the lungs every 6 (six) hours as needed for wheezing or shortness of breath. Rochel Brome, MD  Active   aspirin EC 81 MG tablet [id number]  Take 81 mg by mouth daily.  [provider]  Active Self  atorvastatin (LIPITOR) 40 MG tablet [id number] Yes Take 1 tablet (40 mg total) by mouth daily. Lillard Anes, MD Taking Active   cholecalciferol (VITAMIN D3) 25 MCG (1000 UNIT) tablet [id number]  Take 1,000 Units by mouth daily. [provider]  Active   Continuous Blood Gluc Sensor (FREESTYLE LIBRE [place]) [place] [id number]  USE AS DIRECTED EVERY  14  DAYS Lillard Anes, MD  Active   fenofibrate (TRICOR) 145 MG tablet [id number] Yes TAKE ONE TABLET BY MOUTH EVERYDAY AT BEDTIME Lillard Anes, MD Taking Active   gabapentin (NEURONTIN) 600 MG tablet [id number]  TAKE ONE TABLET BY MOUTH FOUR TIMES DAILY Lillard Anes, MD  Active   guaiFENesin-codeine [facility]) 100-10 MG/5ML syrup [id number]  Take 5 mLs by mouth 3 (three) times daily as needed. Lillard Anes, MD  Active   insulin degludec [facility]) 100 UNIT/ML FlexTouch Pen [id number]  INJECT 40 UNITS SUBCUTANEOUSLY ONCE DAILY Lillard Anes, MD  Active   metFORMIN (GLUCOPHAGE) 1000 MG tablet [id number] Yes Take 1 tablet (1,000 mg total) by mouth 2 (two) times daily. Lillard Anes, MD Taking Active   metoprolol succinate (TOPROL-XL) 25 MG 24 hr tablet [id number]  Take 1 tablet (25 mg total) by mouth daily. Lillard Anes, MD  Active   mupirocin ointment (BACTROBAN) 2 % [id number]  Apply topically 2 (two) times daily. Lillard Anes, MD  Active   nitroGLYCERIN (NITROSTAT) 0.4 MG SL tablet [id number]  Place 1 tablet (0.4 mg total) under the tongue every 5 (five) minutes as needed for chest pain. Lillard Anes, MD  Active   [facility] VERIO test strip [id number]  1 each by Other route daily. Lillard Anes, MD  Active   Semaglutide,0.25 or 0.5MG/DOS, (OZEMPIC, 0.25 OR 0.5 MG/DOSE,) 2 MG/1.5ML SOPN [id number]  Inject 0.5 mg into the skin once  a week. Lillard Anes, MD  Active   vitamin C (VITAMIN C) 500  MG tablet [id number]  Take 1 tablet (500 mg total) by mouth 2 (two) times daily. Ainsley Spinner, PA-C  Active   zolpidem (AMBIEN) 5 MG tablet [id number]  Take 1 tablet (5 mg total) by mouth at bedtime as needed for sleep. 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 07/05/2019   Chip fracture of triquetral bone of right wrist, closed, initial encounter 12/09/2016   Contracture of tendon sheath 08/20/2016   Diabetic polyneuropathy associated with type 2 diabetes mellitus ([place]) 08/20/2016   Metatarsalgia of both feet 08/20/2016   Essential hypertension 12/26/2015   Diabetes mellitus due to underlying condition with unspecified complications ([place]) 50/27/7412   Vitamin D insufficiency 12/20/2014   CAD (coronary artery disease)    Chronic back pain     Immunization History  Administered Date(s) Administered   Fluad Quad(high Dose 65+) 06/14/2020   Moderna Sars-Covid-2 Vaccination 09/19/2019, 10/17/2019, 05/07/2020   Pneumococcal Polysaccharide-23 06/14/2020    Conditions to be addressed/monitored:  Hypertension, Hyperlipidemia, Diabetes, Coronary Artery Disease and Chronic pain, Vitamin D insufficiency  Care Plan : CCM Pharmacy Care Plan  Updates made by Lane Hacker, [place] since 11/14/2021 12:00 AM     Problem: CAD, HTN, HLD, DM   Priority: High  Onset Date: 08/29/2020     Long-Range Goal: Disease State Management   Start Date: 08/29/2020  Expected End Date: 08/29/2021  Recent Progress: On track  Priority: High  Note:     Current Barriers:  Unable to independently afford treatment regimen Unable to achieve control of diabetes   Pharmacist Clinical Goal(s):  Patient will verbalize ability to afford treatment regimen achieve  control of diabetes as evidenced by blood sugar readings and a1c through collaboration with PharmD and provider.   Interventions: 1:1 collaboration with Lillard Anes, MD regarding development and update of comprehensive plan of care as evidenced by provider attestation and co-signature Inter-disciplinary care team collaboration (see longitudinal plan of care) Comprehensive medication review performed; medication list updated in electronic medical record  Hypertension (BP goal <130/80) BP Readings from Last 3 Encounters:  04/24/21 140/64  11/14/20 120/64  11/08/20 140/60  -Controlled -Current treatment: metoprolol succinate 25 mg once daily Appropriate, Effective, Safe, Accessible -Medications previously tried: none reported  -Current home readings: not checking -Current dietary habits: light breakfast of cereal or fruit and fast food dinner. Eats nuts occasionally for snack.  -Current exercise habits: stays active but no formal exercise due to leg/back pain.  -Denies hypotensive/hypertensive symptoms -Educated on BP goals and benefits of medications for prevention of heart attack, stroke and kidney damage; Daily salt intake goal < 2300 mg; Exercise goal of 150 minutes per week; Importance of home blood pressure monitoring; -Counseled to monitor BP at home weekly, document, and provide log at future appointments -Counseled on diet and exercise extensively Recommended to continue current medication Counseled on benefits of pool exercise.   Hyperlipidemia: (LDL goal < 55) The ASCVD Risk score (Arnett DK, et al., 2019) failed to calculate for the following reasons:   The patient has a prior MI or stroke diagnosis Lab Results  Component Value Date   CHOL 125 04/24/2021   CHOL 167 08/29/2020   CHOL 151 06/14/2020   Lab Results  Component Value Date   HDL 36 (L) 04/24/2021   HDL 39 (L) 08/29/2020   HDL 38 (L) 06/14/2020   Lab Results  Component Value Date   LDLCALC 75  04/24/2021   LDLCALC 111 (H) 08/29/2020   LDLCALC 96 06/14/2020   Lab Results  Component Value Date   TRIG 65 04/24/2021   TRIG 88 08/29/2020   TRIG 92 06/14/2020   Lab Results  Component Value Date   CHOLHDL 3.5 04/24/2021   CHOLHDL 4.3 08/29/2020   CHOLHDL 4.0 06/14/2020  No results found for: "LDLDIRECT" -Controlled -Current treatment: aspirin ec 81 mg daily Appropriate, Effective, Safe, Accessible Atorvastatin 40 mg daily Appropriate, Effective, Safe, Accessible Fenofibrate 145 mg daily Appropriate, Effective, Safe, Accessible nitrostat prn Appropriate, Effective, Safe, Accessible -Medications previously tried: none reported  -Current dietary patterns: fast food for dinner and fruit/cereal for breakfast. Eats 2 main meals a day. Eats nuts for snacks.  -Current exercise habits: encouraged water aerobics due to back/leg pain.  -Educated on Cholesterol goals;  Benefits of statin for ASCVD risk reduction; Importance of limiting foods high in cholesterol; Exercise goal of 150 minutes per week; -Counseled on diet and exercise extensively Recommended to continue current medication   Diabetes (A1c goal <7%) Lab Results  Component Value Date   HGBA1C 9.1 (H) 04/24/2021   HGBA1C 8.9 (H) 08/29/2020   HGBA1C 9.7 (H) 06/14/2020   Lab Results  Component Value Date   MICROALBUR 80 06/14/2020   LDLCALC 75 04/24/2021   CREATININE 1.00 04/24/2021   Lab Results  Component Value Date   NA 141 04/24/2021   K 4.4 04/24/2021   CREATININE 1.00 04/24/2021   EGFR 82 04/24/2021   GFRNONAA 63 06/26/2020   GLUCOSE 180 (H) 04/24/2021   Lab Results  Component Value Date   WBC 7.8 04/24/2021   HGB 14.8 04/24/2021   HCT 44.6 04/24/2021   MCV 87 04/24/2021   PLT 191 04/24/2021  -Uncontrolled -Current medications: metformin 1000 mg bid Appropriate, Query effective, Safe, Accessible Ozempic 0.25 mg weekly (PAP) Appropriate, Query effective, Safe, Accessible Tresiba 40 units daily  ($0 through [employer]) Appropriate, Query effective, Safe, Accessible -Medications previously tried: ozempic, januvia, glipizide  -Current home glucose readings fasting glucose:  June 2023: Was swimming in the pool, didn't have readings post prandial glucose:  -Denies hypoglycemic/hyperglycemic symptoms -Current meal patterns:  breakfast: cereal or banana with coffee  lunch: typically doesn't eat  dinner: fast food - fried chicken with sides mostly snacks: nuts  drinks: water, sugar free soft drink, coffee, water with sugar free packet -Current exercise: limited due to back/leg pain. Does stay active around the house.  -Educated on A1c and blood sugar goals; Complications of diabetes including kidney damage, retinal damage, and cardiovascular disease; Exercise goal of 150 minutes per week; Benefits of weight loss; Benefits of routine self-monitoring of blood sugar; Continuous glucose monitoring; -Counseled to check feet daily and get yearly eye exams -Counseled on diet and exercise extensively June 2023: Patient neesd f/u with PCP ASAP -Requires ACE/ARB    Patient Goals/Self-Care Activities Patient will:  - take medications as prescribed focus on medication adherence by using pharmacy packaging/delivery check glucose with Freestyle Libre 2, document, and provide at future appointments engage in dietary modifications by limiting carbohydrates.   Follow Up Plan: Telephone follow up appointment with care management team member scheduled for:  November 2023  [place] Constable, Sherian Rein.D. - [phone]       Medication Assistance: None required.  Patient affirms current coverage meets needs. Patient has new coverage through [employer] that makes medications more affordable. Will contact pharmacist if any questions or concerns.   Patient's preferred pharmacy is:  [facility] [address], [place] [address]  [place] [address] Phone: [phone]  Fax: [phone]  [facility] [phone] - [place], [address] AT [place] [address] [place] [address] Phone: [phone] Fax: [phone]  Uses pill box? No - signing up for packaging Pt endorses fair compliance  We discussed: Benefits of medication synchronization, packaging and delivery as well as enhanced pharmacist oversight with [facility]. Patient decided to: Utilize [facility] pharmacy for medication synchronization, packaging and delivery   Verbal consent obtained for [facility] enhanced pharmacy services (medication synchronization, adherence packaging, delivery coordination). A medication sync plan was created to allow patient to get all medications delivered once every 30 to 90 days per patient preference. Patient understands they have freedom to choose pharmacy and clinical pharmacist will coordinate care between all prescribers and [facility].   Care Plan and Follow Up Patient Decision:  Patient agrees to Care Plan and Follow-up.  Plan: Telephone follow up appointment with care management team member scheduled for:  November 2023  [place] Constable, [place].D. - [phone]

## 2021-11-14 NOTE — Patient Instructions (Signed)
Visit Information   Goals Addressed   None    Patient Care Plan: CCM Pharmacy Care Plan     Problem Identified: CAD, HTN, HLD, DM   Priority: High  Onset Date: 08/29/2020     Long-Range Goal: Disease State Management   Start Date: 08/29/2020  Expected End Date: 08/29/2021  Recent Progress: On track  Priority: High  Note:     Current Barriers:  Unable to independently afford treatment regimen Unable to achieve control of diabetes   Pharmacist Clinical Goal(s):  Patient will verbalize ability to afford treatment regimen achieve control of diabetes as evidenced by blood sugar readings and a1c through collaboration with PharmD and provider.   Interventions: 1:1 collaboration with Lillard Anes, MD regarding development and update of comprehensive plan of care as evidenced by provider attestation and co-signature Inter-disciplinary care team collaboration (see longitudinal plan of care) Comprehensive medication review performed; medication list updated in electronic medical record  Hypertension (BP goal <130/80) BP Readings from Last 3 Encounters:  04/24/21 140/64  11/14/20 120/64  11/08/20 140/60  -Controlled -Current treatment: metoprolol succinate 25 mg once daily Appropriate, Effective, Safe, Accessible -Medications previously tried: none reported  -Current home readings: not checking -Current dietary habits: light breakfast of cereal or fruit and fast food dinner. Eats nuts occasionally for snack.  -Current exercise habits: stays active but no formal exercise due to leg/back pain.  -Denies hypotensive/hypertensive symptoms -Educated on BP goals and benefits of medications for prevention of heart attack, stroke and kidney damage; Daily salt intake goal < 2300 mg; Exercise goal of 150 minutes per week; Importance of home blood pressure monitoring; -Counseled to monitor BP at home weekly, document, and provide log at future appointments -Counseled on diet and  exercise extensively Recommended to continue current medication Counseled on benefits of pool exercise.   Hyperlipidemia: (LDL goal < 55) The ASCVD Risk score (Arnett DK, et al., 2019) failed to calculate for the following reasons:   The patient has a prior MI or stroke diagnosis Lab Results  Component Value Date   CHOL 125 04/24/2021   CHOL 167 08/29/2020   CHOL 151 06/14/2020   Lab Results  Component Value Date   HDL 36 (L) 04/24/2021   HDL 39 (L) 08/29/2020   HDL 38 (L) 06/14/2020   Lab Results  Component Value Date   LDLCALC 75 04/24/2021   LDLCALC 111 (H) 08/29/2020   LDLCALC 96 06/14/2020   Lab Results  Component Value Date   TRIG 65 04/24/2021   TRIG 88 08/29/2020   TRIG 92 06/14/2020   Lab Results  Component Value Date   CHOLHDL 3.5 04/24/2021   CHOLHDL 4.3 08/29/2020   CHOLHDL 4.0 06/14/2020  No results found for: "LDLDIRECT" -Controlled -Current treatment: aspirin ec 81 mg daily Appropriate, Effective, Safe, Accessible Atorvastatin 40 mg daily Appropriate, Effective, Safe, Accessible Fenofibrate 145 mg daily Appropriate, Effective, Safe, Accessible nitrostat prn Appropriate, Effective, Safe, Accessible -Medications previously tried: none reported  -Current dietary patterns: fast food for dinner and fruit/cereal for breakfast. Eats 2 main meals a day. Eats nuts for snacks.  -Current exercise habits: encouraged water aerobics due to back/leg pain.  -Educated on Cholesterol goals;  Benefits of statin for ASCVD risk reduction; Importance of limiting foods high in cholesterol; Exercise goal of 150 minutes per week; -Counseled on diet and exercise extensively Recommended to continue current medication   Diabetes (A1c goal <7%) Lab Results  Component Value Date   HGBA1C 9.1 (H) 04/24/2021  HGBA1C 8.9 (H) 08/29/2020   HGBA1C 9.7 (H) 06/14/2020   Lab Results  Component Value Date   MICROALBUR 80 06/14/2020   LDLCALC 75 04/24/2021   CREATININE 1.00  04/24/2021   Lab Results  Component Value Date   NA 141 04/24/2021   K 4.4 04/24/2021   CREATININE 1.00 04/24/2021   EGFR 82 04/24/2021   GFRNONAA 63 06/26/2020   GLUCOSE 180 (H) 04/24/2021   Lab Results  Component Value Date   WBC 7.8 04/24/2021   HGB 14.8 04/24/2021   HCT 44.6 04/24/2021   MCV 87 04/24/2021   PLT 191 04/24/2021  -Uncontrolled -Current medications: metformin 1000 mg bid Appropriate, Query effective, Safe, Accessible Ozempic 0.25 mg weekly (PAP) Appropriate, Query effective, Safe, Accessible Tresiba 40 units daily ($0 through [employer]) Appropriate, Query effective, Safe, Accessible -Medications previously tried: ozempic, januvia, glipizide  -Current home glucose readings fasting glucose:  June 2023: Was swimming in the pool, didn't have readings post prandial glucose:  -Denies hypoglycemic/hyperglycemic symptoms -Current meal patterns:  breakfast: cereal or banana with coffee  lunch: typically doesn't eat  dinner: fast food - fried chicken with sides mostly snacks: nuts  drinks: water, sugar free soft drink, coffee, water with sugar free packet -Current exercise: limited due to back/leg pain. Does stay active around the house.  -Educated on A1c and blood sugar goals; Complications of diabetes including kidney damage, retinal damage, and cardiovascular disease; Exercise goal of 150 minutes per week; Benefits of weight loss; Benefits of routine self-monitoring of blood sugar; Continuous glucose monitoring; -Counseled to check feet daily and get yearly eye exams -Counseled on diet and exercise extensively June 2023: Patient neesd f/u with PCP ASAP -Requires ACE/ARB    Patient Goals/Self-Care Activities Patient will:  - take medications as prescribed focus on medication adherence by using pharmacy packaging/delivery check glucose with Freestyle Libre 2, document, and provide at future appointments engage in dietary modifications by limiting  carbohydrates.   Follow Up Plan: Telephone follow up appointment with care management team member scheduled for: November 2023  [place] Constable, [place].D. - [phone]      Mr. Delashmit was given information about Chronic Care Management services today including:  CCM service includes personalized support from designated clinical staff supervised by his physician, including individualized plan of care and coordination with other care providers 24/7 contact phone numbers for assistance for urgent and routine care needs. Standard insurance, coinsurance, copays and deductibles apply for chronic care management only during months in which we provide at least 20 minutes of these services. Most insurances cover these services at 100%, however patients may be responsible for any copay, coinsurance and/or deductible if applicable. This service may help you avoid the need for more expensive face-to-face services. Only one practitioner may furnish and bill the service in a calendar month. The patient may stop CCM services at any time (effective at the end of the month) by phone call to the office staff.  Patient agreed to services and verbal consent obtained.   The patient verbalized understanding of instructions, educational materials, and care plan provided today and DECLINED offer to receive copy of patient instructions, educational materials, and care plan.  The pharmacy team will reach out to the patient again over the next 60 days.   Lane Hacker, [place]

## 2021-11-14 NOTE — Telephone Encounter (Signed)
Reached out to patient after receiving message from Artelia Laroche. Harrold Donath just spoke with patient and patient was called in May without answer to schedule appointment. Attempt today was unsuccessful as well. Left VM for return call.   Lorita Officer, [place] 11/14/21 2:57 PM

## 2021-11-15 DIAGNOSIS — Z794 Long term (current) use of insulin: Secondary | ICD-10-CM | POA: Diagnosis not present

## 2021-11-15 DIAGNOSIS — I10 Essential (primary) hypertension: Secondary | ICD-10-CM | POA: Diagnosis not present

## 2021-11-15 DIAGNOSIS — E1159 Type 2 diabetes mellitus with other circulatory complications: Secondary | ICD-10-CM | POA: Diagnosis not present

## 2021-11-15 DIAGNOSIS — Z7985 Long-term (current) use of injectable non-insulin antidiabetic drugs: Secondary | ICD-10-CM

## 2021-11-15 DIAGNOSIS — E785 Hyperlipidemia, unspecified: Secondary | ICD-10-CM | POA: Diagnosis not present

## 2021-11-15 DIAGNOSIS — Z7984 Long term (current) use of oral hypoglycemic drugs: Secondary | ICD-10-CM | POA: Diagnosis not present

## 2021-11-18 DIAGNOSIS — R051 Acute cough: Secondary | ICD-10-CM | POA: Diagnosis not present

## 2021-11-18 DIAGNOSIS — J22 Unspecified acute lower respiratory infection: Secondary | ICD-10-CM | POA: Diagnosis not present

## 2021-11-22 ENCOUNTER — Telehealth: Payer: Self-pay

## 2021-11-22 NOTE — Chronic Care Management (AMB) (Signed)
[employer]ovo [employer] patient assistance program notification:  120- day supply of Ozempic 0.25/0.5 mg was filled on 11/12/2021 and should arrive to the office in 10-14 business days. Patient has 1  refill remaining and enrollment will expire on 04/17/2022.  The next refill for patient will be fulfilled on 01/26/2022.  Billee Cashing, CMA Clinical Pharmacist Assistant [phone]

## 2021-11-26 ENCOUNTER — Telehealth: Payer: Self-pay

## 2021-11-26 NOTE — Chronic Care Management (AMB) (Signed)
    Chronic Care Management Pharmacy Assistant   Name: Jermarion Poffenberger  MRN: [id number] DOB: 1956-04-29  Reason for Encounter: Disease State/ Diabetes  Recent office visits:  None  Recent consult visits:  11-18-2021 Anastasio Auerbach, [facility] ([facility] Visit for nasal congestion/cough. START benzonatate 100 mg 3 times daily PRN, Mucinex 600 mg in morning and night, doxycycline 100 mg in morning and night.  Hospital visits:  None in previous 6 months  Medications: Outpatient Encounter Medications as of 11/26/2021  Medication Sig   albuterol (VENTOLIN HFA) 108 (90 Base) MCG/ACT inhaler Inhale 2 puffs into the lungs every 6 (six) hours as needed for wheezing or shortness of breath.   aspirin EC 81 MG tablet Take 81 mg by mouth daily.   atorvastatin (LIPITOR) 40 MG tablet Take 1 tablet (40 mg total) by mouth daily.   cholecalciferol (VITAMIN D3) 25 MCG (1000 UNIT) tablet Take 1,000 Units by mouth daily.   Continuous Blood Gluc Sensor (FREESTYLE LIBRE 14 DAY SENSOR) MISC USE AS DIRECTED EVERY  14  DAYS   fenofibrate (TRICOR) 145 MG tablet TAKE ONE TABLET BY MOUTH EVERYDAY AT BEDTIME   gabapentin (NEURONTIN) 600 MG tablet TAKE ONE TABLET BY MOUTH FOUR TIMES DAILY (Patient not taking: Reported on 11/14/2021)   guaiFENesin-codeine (CHERATUSSIN AC) 100-10 MG/5ML syrup Take 5 mLs by mouth 3 (three) times daily as needed. (Patient not taking: Reported on 11/14/2021)   insulin degludec (TRESIBA FLEXTOUCH) 100 UNIT/ML FlexTouch Pen INJECT 40 UNITS SUBCUTANEOUSLY ONCE DAILY   metFORMIN (GLUCOPHAGE) 1000 MG tablet Take 1 tablet (1,000 mg total) by mouth 2 (two) times daily.   metoprolol succinate (TOPROL-XL) 25 MG 24 hr tablet Take 1 tablet (25 mg total) by mouth daily.   mupirocin ointment (BACTROBAN) 2 % Apply topically 2 (two) times daily.   nitroGLYCERIN (NITROSTAT) 0.4 MG SL tablet Place 1 tablet (0.4 mg total) under the tongue every 5 (five) minutes as needed for chest pain.   ONETOUCH VERIO test  strip 1 each by Other route daily.   Semaglutide,0.25 or 0.5MG /DOS, (OZEMPIC, 0.25 OR 0.5 MG/DOSE,) 2 MG/1.5ML SOPN Inject 0.5 mg into the skin once a week.   vitamin C (VITAMIN C) 500 MG tablet Take 1 tablet (500 mg total) by mouth 2 (two) times daily.   zolpidem (AMBIEN) 5 MG tablet Take 1 tablet (5 mg total) by mouth at bedtime as needed for sleep.   No facility-administered encounter medications on file as of 11/26/2021.  Recent Relevant Labs: Lab Results  Component Value Date/Time   HGBA1C 9.1 (H) 04/24/2021 09:12 AM   HGBA1C 8.9 (H) 08/29/2020 10:59 AM   MICROALBUR 80 06/14/2020 09:41 AM    Kidney Function Lab Results  Component Value Date/Time   CREATININE 1.00 04/24/2021 09:12 AM   CREATININE 1.07 08/29/2020 10:59 AM   GFRNONAA 63 06/26/2020 04:41 PM   GFRAA 72 06/26/2020 04:41 PM     11-26-2021: 1st attempt left VM 11-28-2021: 2nd attempt left VM 11-29-2021: 3rd attempt left VM  Star Rating Drugs: Ozempic 0.25 mg-  Metformin 1000 mg- Last filled 09-06-2021 90 [facility] Atorvastatin 40 mg- Last filled 09-06-2021 90 [facility]  [facility] CMA Clinical Pharmacist Assistant [phone]

## 2021-11-29 ENCOUNTER — Other Ambulatory Visit: Payer: Self-pay | Admitting: Legal Medicine

## 2021-12-09 ENCOUNTER — Telehealth: Payer: Self-pay

## 2021-12-09 NOTE — Telephone Encounter (Signed)
LM for patient to call the office back about getting an appointment set up.

## 2021-12-09 NOTE — Telephone Encounter (Signed)
-----   Message from Blane Ohara, MD sent at 12/07/2021  7:26 PM EDT ----- Regarding: needs fasting appt Patient needs fasting appt with any of [place]. I do not know if he speaks english. Dr Sedalia Muta

## 2021-12-10 ENCOUNTER — Other Ambulatory Visit: Payer: Self-pay | Admitting: Legal Medicine

## 2021-12-10 ENCOUNTER — Other Ambulatory Visit: Payer: Self-pay

## 2021-12-10 DIAGNOSIS — I10 Essential (primary) hypertension: Secondary | ICD-10-CM

## 2021-12-10 MED ORDER — METOPROLOL SUCCINATE ER 25 MG PO TB24: 25.0000 mg | ORAL_TABLET | Freq: Every day | ORAL | 0 refills | Status: DC

## 2021-12-17 ENCOUNTER — Other Ambulatory Visit: Payer: Self-pay

## 2021-12-17 DIAGNOSIS — E088 Diabetes mellitus due to underlying condition with unspecified complications: Secondary | ICD-10-CM

## 2021-12-17 MED ORDER — METFORMIN HCL 1000 MG PO TABS: 1000.0000 mg | ORAL_TABLET | Freq: Two times a day (BID) | ORAL | 0 refills | Status: DC

## 2021-12-19 ENCOUNTER — Other Ambulatory Visit: Payer: Self-pay | Admitting: Legal Medicine

## 2021-12-19 DIAGNOSIS — E785 Hyperlipidemia, unspecified: Secondary | ICD-10-CM

## 2021-12-22 DIAGNOSIS — K047 Periapical abscess without sinus: Secondary | ICD-10-CM | POA: Diagnosis not present

## 2021-12-22 DIAGNOSIS — R6884 Jaw pain: Secondary | ICD-10-CM | POA: Diagnosis not present

## 2021-12-25 ENCOUNTER — Encounter: Payer: Self-pay | Admitting: Nurse Practitioner

## 2021-12-25 ENCOUNTER — Ambulatory Visit (INDEPENDENT_AMBULATORY_CARE_PROVIDER_SITE_OTHER): Payer: HMO | Admitting: Nurse Practitioner

## 2021-12-25 VITALS — BP 136/64 | HR 66 | Temp 97.6°F | Ht 65.0 in | Wt 183.0 lb

## 2021-12-25 DIAGNOSIS — M545 Low back pain, unspecified: Secondary | ICD-10-CM | POA: Diagnosis not present

## 2021-12-25 DIAGNOSIS — G8929 Other chronic pain: Secondary | ICD-10-CM | POA: Diagnosis not present

## 2021-12-25 DIAGNOSIS — E782 Mixed hyperlipidemia: Secondary | ICD-10-CM

## 2021-12-25 DIAGNOSIS — Z125 Encounter for screening for malignant neoplasm of prostate: Secondary | ICD-10-CM | POA: Diagnosis not present

## 2021-12-25 DIAGNOSIS — M79604 Pain in right leg: Secondary | ICD-10-CM | POA: Diagnosis not present

## 2021-12-25 DIAGNOSIS — I2510 Atherosclerotic heart disease of native coronary artery without angina pectoris: Secondary | ICD-10-CM

## 2021-12-25 DIAGNOSIS — E1142 Type 2 diabetes mellitus with diabetic polyneuropathy: Secondary | ICD-10-CM | POA: Diagnosis not present

## 2021-12-25 DIAGNOSIS — I10 Essential (primary) hypertension: Secondary | ICD-10-CM

## 2021-12-25 DIAGNOSIS — Z1211 Encounter for screening for malignant neoplasm of colon: Secondary | ICD-10-CM | POA: Diagnosis not present

## 2021-12-25 DIAGNOSIS — Z23 Encounter for immunization: Secondary | ICD-10-CM

## 2021-12-25 LAB — HM DIABETES EYE EXAM

## 2021-12-25 MED ORDER — MELOXICAM 15 MG PO TABS: 15.0000 mg | ORAL_TABLET | Freq: Every day | ORAL | 1 refills | Status: DC

## 2021-12-25 MED ORDER — LIDOCAINE 5 % EX PTCH: 1.0000 | MEDICATED_PATCH | CUTANEOUS | 1 refills | Status: DC

## 2021-12-25 NOTE — Patient Instructions (Addendum)
Begin Mobic 15 mg daily with food for joint pain Do not take NSAIDs (Ibuprofen, Aleve, etc.) We will call you with lab results and colonoscopy referral Continue medications Recommend routine eye exam Prevnar-20 pneumonia vaccine given today in office Follow-up in 57-months, fasting  Pneumococcal Conjugate Vaccine (Prevnar 20) Suspension for Injection What is this medication? PNEUMOCOCCAL VACCINE (NEU mo KOK al vak SEEN) is a vaccine. It prevents pneumococcus bacterial infections. These bacteria can cause serious infections like pneumonia, meningitis, and blood infections. This vaccine will not treat an infection and will not cause infection. This vaccine is recommended for adults 18 years and older. This medicine may be used for other purposes; ask your health care provider or pharmacist if you have questions. COMMON BRAND NAME(S): Prevnar 20 What should I tell my care team before I take this medication? They need to know if you have any of these conditions: bleeding disorder fever immune system problems an unusual or allergic reaction to pneumococcal vaccine, diphtheria toxoid, other vaccines, other medicines, foods, dyes, or preservatives pregnant or trying to get pregnant breast-feeding How should I use this medication? This vaccine is injected into a muscle. It is given by a health care provider. A copy of Vaccine Information Statements will be given before each vaccination. Be sure to read this information carefully each time. This sheet may change often. Talk to your health care provider about the use of this medicine in children. Special care may be needed. Overdosage: If you think you have taken too much of this medicine contact a poison control center or emergency room at once. NOTE: This medicine is only for you. Do not share this medicine with others. What if I miss a dose? This does not apply. This medicine is not for regular use. What may interact with this  medication? medicines for cancer chemotherapy medicines that suppress your immune function steroid medicines like prednisone or cortisone This list may not describe all possible interactions. Give your health care provider a list of all the medicines, herbs, non-prescription drugs, or dietary supplements you use. Also tell them if you smoke, drink alcohol, or use illegal drugs. Some items may interact with your medicine. What should I watch for while using this medication? Mild fever and pain should go away in 3 days or less. Report any unusual symptoms to your health care provider. What side effects may I notice from receiving this medication? Side effects that you should report to your doctor or health care professional as soon as possible: allergic reactions (skin rash, itching or hives; swelling of the face, lips, or tongue) confusion fast, irregular heartbeat fever over 102 degrees F muscle weakness seizures trouble breathing unusual bruising or bleeding Side effects that usually do not require medical attention (report to your doctor or health care professional if they continue or are bothersome): fever of 102 degrees F or less headache joint pain muscle cramps, pain pain, tender at site where injected This list may not describe all possible side effects. Call your doctor for medical advice about side effects. You may report side effects to FDA at 1-800-FDA-1088. Where should I keep my medication? This vaccine is only given by a health care provider. It will not be stored at home. NOTE: This sheet is a summary. It may not cover all possible information. If you have questions about this medicine, talk to your doctor, pharmacist, or health care provider.  2023 Elsevier/Gold Standard (2020-01-06 00:00:00)   Joint Pain  Joint pain can be caused by  many things. It is likely to go away if you follow instructions from your doctor for taking care of yourself at home. Sometimes, you may  need more treatment. Follow these instructions at home: Managing pain, stiffness, and swelling     If told, put ice on the painful area. To do this: If you have a removable elastic bandage, sling, or splint, take it off as told by your doctor. Put ice in a plastic bag. Place a towel between your skin and the bag. Leave the ice on for 20 minutes, 2-3 times a day. Take off the ice if your skin turns bright red. This is very important. If you cannot feel pain, heat, or cold, you have a greater risk of damage to the area. Move your fingers or toes below the painful joint often. Raise the painful joint above the level of your heart while you are sitting or lying down. If told, put heat on the painful area. Do this as often as told by your doctor. Use the heat source that your doctor recommends, such as a moist heat pack or a heating pad. Place a towel between your skin and the heat source. Leave the heat on for 20-30 minutes. Take off the heat if your skin gets bright red. This is especially important if you are unable to feel pain, heat, or cold. You may have a greater risk of getting burned. Activity Rest the painful joint for as long as told by your doctor. Do not do things that cause pain or make your pain worse. Begin exercising or stretching the affected area, as told by your doctor. Ask your doctor what types of exercise are safe for you. Return to your normal activities when your doctor says that it is safe. If you have an elastic bandage, sling, or splint: Wear it as told by your doctor. Take it only as told by your doctor. Loosen it your fingers or toes below the joint: Tingle. Become numb. Get cold and blue. Keep it clean. Ask your doctor if you should take it off before bathing. If it is not waterproof: Do not let it get wet. Cover it with a watertight covering when you take a bath or shower. General instructions Take over-the-counter and prescription medicines only as told  by your doctor. This may include medicines taken by mouth or applied to the skin. Do not smoke or use any products that contain nicotine or tobacco. If you need help quitting, ask your doctor. Keep all follow-up visits as told by your doctor. This is important. Contact a doctor if: You have pain that gets worse and does not get better with medicine. Your joint pain does not get better in 3 days. You have more bruising or swelling. You have a fever. You lose 10 lb (4.5 kg) or more without trying. Get help right away if: You cannot move the joint. Your fingers or toes tingle, become numb. or get cold and blue. You have a fever along with a joint that is red, warm, and swollen. Summary Joint pain can be caused by many things. It often goes away if you follow instructions from your doctor for taking care of yourself at home. Rest the painful joint for as long as told. Do not do things that cause pain or make your pain worse. Take over-the-counter and prescription medicines only as told by your doctor. This information is not intended to replace advice given to you by your health care provider. Make sure you  discuss any questions you have with your health care provider. Document Revised: 08/17/2019 Document Reviewed: 08/17/2019 Elsevier Patient Education  2023 [employer].

## 2021-12-25 NOTE — Progress Notes (Addendum)
Subjective:  Patient ID: Mitchell Hammond, male    DOB: Dec 17, 1955  Age: 66 y.o. MRN: [id number]  CC: T2DM Hyperlipidemia HTN CAD  HPI: Pt presents for follow-up of T2DM, HTN, and Hyperlipidemia. Pt states he has severe chronic right knee pain that is interfering with quality of life. Reports previous fracture of right leg, required ORIF. He tells me that ortho recommended right TKA which he has postponed. Currently taking Tylenol PRN for pain.   Diabetes Mellitus Type II, follow-up  Lab Results  Component Value Date   HGBA1C 9.1 (H) 04/24/2021   HGBA1C 8.9 (H) 08/29/2020   HGBA1C 9.7 (H) 06/14/2020   Last seen for diabetes 8 months ago.  Management since then includes ozempic and tresiba He reports excellent compliance with treatment. He is not having side effects.   Home blood sugar records: 90-300's  Current insulin regiment: 40 units at night Most Recent Eye Exam: Not UTD  Hypertension, follow-up  BP Readings from Last 3 Encounters:  04/24/21 140/64  11/14/20 120/64  11/08/20 140/60   Wt Readings from Last 3 Encounters:  12/25/21 183 lb (83 kg)  04/24/21 195 lb (88.5 kg)  11/14/20 190 lb (86.2 kg)     BP at that visit was 140/64. Management since that visit includes Metoprolol. He reports excellent compliance with treatment. He is not having side effects.  He is not exercising. He is not adherent to low salt diet.   He does not smoke.  Coronary artery disease, follow up  The patient was last seen for CAD on 04/23/21. He is taking daily aspirin. Taking Tricor and Lipitor He reports good compliance with treatment. He is not having side effects.  He is not having to take nitroglycerine. He is experiencing none. He is not experiencing none. He is able to carry groceries,     is able to climb stairs,      is able to cut grass,      is able to work in the yard without having above symptoms.  His last vist with his cardiologist was 10/03/20 with Dr  Tomie China.  Lipid Panel     Component Value Date/Time   CHOL 125 04/24/2021 0912   TRIG 65 04/24/2021 0912   HDL 36 (L) 04/24/2021 0912   CHOLHDL 3.5 04/24/2021 0912   LDLCALC 75 04/24/2021 0912   Last metabolic panel Lab Results  Component Value Date   GLUCOSE 180 (H) 04/24/2021   NA 141 04/24/2021   K 4.4 04/24/2021   CL 106 04/24/2021   CO2 22 04/24/2021   BUN 13 04/24/2021   CREATININE 1.00 04/24/2021   GFRNONAA 63 06/26/2020   GFRAA 72 06/26/2020   CALCIUM 9.4 04/24/2021   PHOS 4.0 12/18/2014   PROT 6.2 04/24/2021   ALBUMIN 4.4 04/24/2021   LABGLOB 1.8 04/24/2021   AGRATIO 2.4 (H) 04/24/2021   BILITOT 0.7 04/24/2021   ALKPHOS 66 04/24/2021   AST 30 04/24/2021   ALT 34 04/24/2021   ANIONGAP 12 07/24/2015         ---------------------------------------------------------------------------------------------------  Current Outpatient Medications on File Prior to Visit  Medication Sig Dispense Refill   albuterol (VENTOLIN HFA) 108 (90 Base) MCG/ACT inhaler Inhale 2 puffs into the lungs every 6 (six) hours as needed for wheezing or shortness of breath. 8 g 3   aspirin EC 81 MG tablet Take 81 mg by mouth daily.     atorvastatin (LIPITOR) 40 MG tablet Take 1 tablet (40 mg total) by mouth daily. 90  tablet 2   cholecalciferol (VITAMIN D3) 25 MCG (1000 UNIT) tablet Take 1,000 Units by mouth daily.     Continuous Blood Gluc Sensor (FREESTYLE LIBRE 14 DAY SENSOR) MISC USE AS DIRECTED EVERY  14  DAYS 3 each 6   fenofibrate (TRICOR) 145 MG tablet TAKE 1 TABLET BY MOUTH AT BEDTIME 90 tablet 0   gabapentin (NEURONTIN) 600 MG tablet TAKE ONE TABLET BY MOUTH FOUR TIMES DAILY (Patient not taking: Reported on 11/14/2021) 360 tablet 5   guaiFENesin-codeine (CHERATUSSIN AC) 100-10 MG/5ML syrup Take 5 mLs by mouth 3 (three) times daily as needed. (Patient not taking: Reported on 11/14/2021) 120 mL 0   insulin degludec (TRESIBA FLEXTOUCH) 100 UNIT/ML FlexTouch Pen INJECT 40 UNITS  SUBCUTANEOUSLY ONCE DAILY 15 mL 3   metFORMIN (GLUCOPHAGE) 1000 MG tablet Take 1 tablet (1,000 mg total) by mouth 2 (two) times daily. 60 tablet 0   metoprolol succinate (TOPROL-XL) 25 MG 24 hr tablet Take 1 tablet (25 mg total) by mouth daily. 30 tablet 0   mupirocin ointment (BACTROBAN) 2 % Apply topically 2 (two) times daily. 22 g 6   nitroGLYCERIN (NITROSTAT) 0.4 MG SL tablet Place 1 tablet (0.4 mg total) under the tongue every 5 (five) minutes as needed for chest pain. 25 tablet 1   ONETOUCH VERIO test strip 1 each by Other route daily. 100 each 2   Semaglutide,0.25 or 0.5MG /DOS, (OZEMPIC, 0.25 OR 0.5 MG/DOSE,) 2 MG/1.5ML SOPN Inject 0.5 mg into the skin once a week. 3 mL 6   vitamin C (VITAMIN C) 500 MG tablet Take 1 tablet (500 mg total) by mouth 2 (two) times daily. 60 tablet 2   zolpidem (AMBIEN) 5 MG tablet Take 1 tablet (5 mg total) by mouth at bedtime as needed for sleep. 30 tablet 3   No current facility-administered medications on file prior to visit.   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   Diabetes mellitus due to underlying condition with unspecified complications (HCC) 12/26/2015   Diabetic polyneuropathy associated with type 2 diabetes mellitus (HCC) 08/20/2016   Discitis    Essential hypertension 12/26/2015   Fracture, tibial plateau 12/18/2014   Hypertension    IDDM (insulin dependent diabetes mellitus)    type 2   Metatarsalgia of both feet 08/20/2016   MI (myocardial infarction) (HCC)    x 2, Multi-link Vision 3.5 x 15 mm BMS in 2009 (mid LAD), balloon 2015   Mixed hyperlipidemia 06/14/2020   Obesity (BMI 30.0-34.9) 06/26/2020   Tibial plateau fracture 12/18/2014   Vitamin D insufficiency    Past Surgical  History:  Procedure Laterality Date   BACK SURGERY     2014   CARDIAC CATHETERIZATION     x 2, 2009 & 2015   COLONOSCOPY     CORONARY ANGIOPLASTY     stent - 2008, balloon - 2015   FASCIOTOMY Right 12/19/2014   Procedure: ANTERIOR COMPARTMENTAL FASCIOTOMY;  Surgeon: Myrene Galas, MD;  Location: [facility] OR;  Service: Orthopedics;  Laterality: Right;   HAND SURGERY Left    HARDWARE REMOVAL Right 07/24/2015   Procedure: HARDWARE REMOVAL RIGHT TIBIA;  Surgeon: Myrene Galas, MD;  Location: [facility] OR;  Service: Orthopedics;  Laterality: Right;  KNEE ARTHROSCOPY Right 07/24/2015   Procedure: RIGHT ARTHROSCOPY KNEE WITH PARTIAL SYNOVECTOMY AND MEDIAL MENISCECTOMY;  Surgeon: Myrene Galas, MD;  Location: [facility] OR;  Service: Orthopedics;  Laterality: Right;   LEFT HEART CATH AND CORONARY ANGIOGRAPHY N/A 07/02/2020   Procedure: LEFT HEART CATH AND CORONARY ANGIOGRAPHY;  Surgeon: Lennette Bihari, MD;  Location: [facility] INVASIVE CV LAB;  Service: Cardiovascular;  Laterality: N/A;   ORIF TIBIA PLATEAU Right 12/19/2014   Procedure: OPEN REDUCTION INTERNAL FIXATION (ORIF) RIGHT TIBIAL PLATEAU;  Surgeon: Myrene Galas, MD;  Location: [facility] OR;  Service: Orthopedics;  Laterality: Right;   TONSILLECTOMY     VASECTOMY      Family History  Problem Relation Age of Onset   Diabetes Mother    CAD Mother    Liver disease Father    Social History   Socioeconomic History   Marital status: Married    Spouse name: Not on file   Number of children: 2   Years of education: Not on file   Highest education level: Not on file  Occupational History   Occupation: disabled  Tobacco Use   Smoking status: Former    Packs/day: 1.00    Types: Cigarettes    Quit date: 02/20/2007    Years since quitting: 14.8   Smokeless tobacco: Never  Vaping Use   Vaping Use: Never used  Substance and Sexual Activity   Alcohol use: No    Alcohol/week: 0.0 standard drinks of alcohol    Comment: None in years   Drug use: No   Sexual activity: Yes     Partners: Female  Other Topics Concern   Not on file  Social History Narrative   Pt lives with wife and daughter in [place].    Social Determinants of Health   Financial Resource Strain: Low Risk  (11/14/2021)   Overall Financial Resource Strain (CARDIA)    Difficulty of Paying Living Expenses: Not hard at all  Food Insecurity: No Food Insecurity (08/29/2020)   Hunger Vital Sign    Worried About Running Out of Food in the Last Year: Never true    Ran Out of Food in the Last Year: Never true  Transportation Needs: No Transportation Needs (11/14/2021)   PRAPARE - Administrator, Civil Service (Medical): No    Lack of Transportation (Non-Medical): No  Physical Activity: Not on file  Stress: Not on file  Social Connections: Not on file    Review of Systems  Constitutional:  Negative for chills, diaphoresis, fatigue and fever.  HENT:  Negative for congestion, ear pain and sore throat.   Respiratory:  Negative for cough and shortness of breath.   Cardiovascular:  Negative for chest pain and leg swelling.  Gastrointestinal:  Negative for abdominal pain, constipation, diarrhea, nausea and vomiting.  Endocrine: Positive for polyuria (Nocturia).  Genitourinary:  Negative for dysuria and urgency.  Musculoskeletal:  Positive for arthralgias (right knee, chronic) and gait problem. Negative for myalgias.  Neurological:  Negative for dizziness and headaches.  Psychiatric/Behavioral:  Negative for dysphoric mood.      Objective:  BP 136/64   Pulse 66   Temp 97.6 F (36.4 C)   Ht 5\' 5"  (1.651 m)   Wt 183 lb (83 kg)   SpO2 99%   BMI 30.45 kg/m       04/24/2021    8:25 AM 11/14/2020    2:03 PM 11/08/2020   11:01 AM  BP/Weight  Systolic BP 140 120 140  Diastolic BP  64 64 60  Wt. (Lbs) 195 190 191  BMI 32.45 kg/m2 31.62 kg/m2 31.78 kg/m2    Physical Exam Constitutional:      Appearance: Normal appearance.  Eyes:     Pupils: Pupils are equal, round, and reactive to  light.  Neck:     Vascular: No carotid bruit.  Cardiovascular:     Rate and Rhythm: Normal rate and regular rhythm.     Heart sounds: Normal heart sounds. No murmur heard. Pulmonary:     Effort: Pulmonary effort is normal.     Breath sounds: Normal breath sounds.  Abdominal:     General: Bowel sounds are normal.     Palpations: Abdomen is soft. There is no mass.     Tenderness: There is no abdominal tenderness.  Musculoskeletal:        General: No swelling.  Skin:    General: Skin is warm and dry.     Capillary Refill: Capillary refill takes less than 2 seconds.  Neurological:     General: No focal deficit present.     Mental Status: He is alert and oriented to person, place, and time.  Psychiatric:        Mood and Affect: Mood normal.        Behavior: Behavior normal.    Lab Results  Component Value Date   WBC 7.8 04/24/2021   HGB 14.8 04/24/2021   HCT 44.6 04/24/2021   PLT 191 04/24/2021   GLUCOSE 180 (H) 04/24/2021   CHOL 125 04/24/2021   TRIG 65 04/24/2021   HDL 36 (L) 04/24/2021   LDLCALC 75 04/24/2021   ALT 34 04/24/2021   AST 30 04/24/2021   NA 141 04/24/2021   K 4.4 04/24/2021   CL 106 04/24/2021   CREATININE 1.00 04/24/2021   BUN 13 04/24/2021   CO2 22 04/24/2021   TSH 2.829 12/18/2014   INR 1.20 12/18/2014   HGBA1C 9.1 (H) 04/24/2021   MICROALBUR 80 06/14/2020      Assessment & Plan:    1. Essential hypertension-well controlled - CBC with Differential/Platelet - Comprehensive metabolic panel - Cardiovascular Risk Assessment -continue Metoprolol as prescribed -heart healthy diet  2. Mixed hyperlipidemia-well controlled - CBC with Differential/Platelet - Comprehensive metabolic panel - Lipid panel - Cardiovascular Risk Assessment -continue Lipitor 40 mg QD   3. Diabetic polyneuropathy associated with type 2 diabetes mellitus (HCC)-not at goal - CBC with Differential/Platelet - Comprehensive metabolic panel - Hemoglobin A1c -  Microalbumin / creatinine urine ratio - Cardiovascular Risk Assessment -continue Ozempic 0.5 mg weekly -continue Metformin 1, 000 mg BID -continue Tresiba 40 units QD  4. Coronary artery disease involving native coronary artery of native heart without angina pectoris-well controlled - CBC with Differential/Platelet - Comprehensive metabolic panel - Lipid panel -follow up with cardiologist as scheduled -heart healthy diet -continue regular physical acitivity  5. Screening for colon cancer - Ambulatory referral to Gastroenterology  6. Right leg pain - meloxicam (MOBIC) 15 MG tablet; Take 1 tablet (15 mg total) by mouth daily.  Dispense: 90 tablet; Refill: 1  7. Encounter for screening for malignant neoplasm of prostate - PSA  8. Chronic bilateral low back pain without sciatica - lidocaine (LIDODERM) 5 %; Place 1 patch onto the skin daily. Remove & Discard patch within 12 hours or as directed by MD  Dispense: 30 patch; Refill: 1  9. Need for vaccination - Pneumococcal conjugate vaccine 20-valent     Begin Mobic 15 mg daily with food for  joint pain Do not take NSAIDs (Ibuprofen, Aleve, etc.) We will call you with lab results and colonoscopy referral Continue medications Recommend routine eye exam Prevnar-20 pneumonia vaccine given today in office Follow-up in 65-months, fasting  Follow-up: 33-months, fasting  An After Visit Summary was printed and given to the patient.  I, Janie Morning, NP, have reviewed all documentation for this visit. The documentation on 12/25/21 for the exam, diagnosis, procedures, and orders are all accurate and complete.    Signed, Janie Morning, NP [facility] [phone]

## 2021-12-26 LAB — COMPREHENSIVE METABOLIC PANEL
ALT: 26 IU/L (ref 0–44)
AST: 16 IU/L (ref 0–40)
Albumin/Globulin Ratio: 2.6 — ABNORMAL HIGH (ref 1.2–2.2)
Albumin: 4.2 g/dL (ref 3.9–4.9)
Alkaline Phosphatase: 78 IU/L (ref 44–121)
BUN/Creatinine Ratio: 16 (ref 10–24)
BUN: 16 mg/dL (ref 8–27)
Bilirubin Total: 0.5 mg/dL (ref 0.0–1.2)
CO2: 20 mmol/L (ref 20–29)
Calcium: 9.2 mg/dL (ref 8.6–10.2)
Chloride: 105 mmol/L (ref 96–106)
Creatinine, Ser: 0.98 mg/dL (ref 0.76–1.27)
Globulin, Total: 1.6 g/dL (ref 1.5–4.5)
Glucose: 246 mg/dL — ABNORMAL HIGH (ref 70–99)
Potassium: 4.8 mmol/L (ref 3.5–5.2)
Sodium: 138 mmol/L (ref 134–144)
Total Protein: 5.8 g/dL — ABNORMAL LOW (ref 6.0–8.5)
eGFR: 85 mL/min/{1.73_m2} (ref >59)

## 2021-12-26 LAB — CBC WITH DIFFERENTIAL/PLATELET
Basophils Absolute: 0 10*3/uL (ref 0.0–0.2)
Basos: 1 %
EOS (ABSOLUTE): 0.1 10*3/uL (ref 0.0–0.4)
Eos: 2 %
Hematocrit: 40.2 % (ref 37.5–51.0)
Hemoglobin: 13 g/dL (ref 13.0–17.7)
Immature Grans (Abs): 0 10*3/uL (ref 0.0–0.1)
Immature Granulocytes: 0 %
Lymphocytes Absolute: 1 10*3/uL (ref 0.7–3.1)
Lymphs: 17 %
MCH: 28.5 pg (ref 26.6–33.0)
MCHC: 32.3 g/dL (ref 31.5–35.7)
MCV: 88 fL (ref 79–97)
Monocytes Absolute: 0.5 10*3/uL (ref 0.1–0.9)
Monocytes: 8 %
Neutrophils Absolute: 4.4 10*3/uL (ref 1.4–7.0)
Neutrophils: 72 %
Platelets: 171 10*3/uL (ref 150–450)
RBC: 4.56 x10E6/uL (ref 4.14–5.80)
RDW: 13.6 % (ref 11.6–15.4)
WBC: 6 10*3/uL (ref 3.4–10.8)

## 2021-12-26 LAB — LIPID PANEL
Chol/HDL Ratio: 3.5 ratio (ref 0.0–5.0)
Cholesterol, Total: 137 mg/dL (ref 100–199)
HDL: 39 mg/dL — ABNORMAL LOW (ref >39)
LDL Chol Calc (NIH): 80 mg/dL (ref 0–99)
Triglycerides: 98 mg/dL (ref 0–149)
VLDL Cholesterol Cal: 18 mg/dL (ref 5–40)

## 2021-12-26 LAB — CARDIOVASCULAR RISK ASSESSMENT

## 2021-12-26 LAB — HEMOGLOBIN A1C
Est. average glucose Bld gHb Est-mCnc: 197 mg/dL
Hgb A1c MFr Bld: 8.5 % — ABNORMAL HIGH (ref 4.8–5.6)

## 2021-12-26 LAB — MICROALBUMIN / CREATININE URINE RATIO
Creatinine, Urine: 148.2 mg/dL
Microalb/Creat Ratio: 39 mg/g creat — ABNORMAL HIGH (ref 0–29)
Microalbumin, Urine: 58.2 ug/mL

## 2021-12-26 LAB — PSA: Prostate Specific Ag, Serum: 2.4 ng/mL (ref 0.0–4.0)

## 2021-12-27 ENCOUNTER — Telehealth: Payer: Self-pay

## 2021-12-27 NOTE — Telephone Encounter (Signed)
PA submitted and denied via covermymeds pt does not have dx of post herpetic neuralgia.

## 2021-12-30 ENCOUNTER — Telehealth: Payer: Self-pay

## 2021-12-30 ENCOUNTER — Other Ambulatory Visit: Payer: Self-pay | Admitting: Nurse Practitioner

## 2021-12-30 ENCOUNTER — Other Ambulatory Visit: Payer: Self-pay | Admitting: Legal Medicine

## 2021-12-30 DIAGNOSIS — E785 Hyperlipidemia, unspecified: Secondary | ICD-10-CM

## 2021-12-30 DIAGNOSIS — R110 Nausea: Secondary | ICD-10-CM

## 2021-12-30 DIAGNOSIS — Z794 Long term (current) use of insulin: Secondary | ICD-10-CM

## 2021-12-30 MED ORDER — GVOKE HYPOPEN 2-PACK 1 MG/0.2ML ~~LOC~~ SOAJ: 1.0000 | SUBCUTANEOUS | 1 refills | Status: DC | PRN

## 2021-12-30 MED ORDER — SEMAGLUTIDE (1 MG/DOSE) 4 MG/3ML ~~LOC~~ SOPN: 1.0000 mg | PEN_INJECTOR | SUBCUTANEOUS | 1 refills | Status: DC

## 2021-12-30 MED ORDER — LISINOPRIL 2.5 MG PO TABS: 2.5000 mg | ORAL_TABLET | Freq: Every day | ORAL | 1 refills | Status: DC

## 2021-12-30 MED ORDER — ONDANSETRON HCL 4 MG PO TABS: 4.0000 mg | ORAL_TABLET | Freq: Three times a day (TID) | ORAL | 0 refills | Status: DC | PRN

## 2021-12-30 NOTE — Chronic Care Management (AMB) (Signed)
Chronic Care Management Pharmacy Assistant   Name: Mitchell Hammond  MRN: [id number] DOB: [birth year]  Reason for Encounter: Disease State/ Diabetes  Recent office visits:  12-25-2021 Janie Morning, NP. Glucose= 246, Total protein= 5.8. A1C= 8.5. HDL= 39. Microalb/Creat Ratio= 39. Referral placed to gastro. START lidocaine 5 % Place 1 patch onto the skin daily. Remove & Discard patch within 12 hours and meloxicam 15 mg daily.  Recent consult visits:  12-22-2021 Anastasio Auerbach, [facility] (Urgent care). Visit for dental abscess. START amoxicillin 875 mg twice daily for 10 days.  11-18-2021 Anastasio Auerbach, [facility] (Convenient care). Visit for nasal congestion/cough. START benzonatate 100 mg 3 times daily PRN, Mucinex 600 mg in morning and night, doxycycline 100 mg in morning and night.  Hospital visits:  None in previous 6 months  Medications: Outpatient Encounter Medications as of 12/30/2021  Medication Sig   albuterol (VENTOLIN HFA) 108 (90 Base) MCG/ACT inhaler Inhale 2 puffs into the lungs every 6 (six) hours as needed for wheezing or shortness of breath.   aspirin EC 81 MG tablet Take 81 mg by mouth daily.   atorvastatin (LIPITOR) 40 MG tablet Take 1 tablet (40 mg total) by mouth daily.   cholecalciferol (VITAMIN D3) 25 MCG (1000 UNIT) tablet Take 1,000 Units by mouth daily.   Continuous Blood Gluc Sensor (FREESTYLE LIBRE 14 DAY SENSOR) MISC USE AS DIRECTED EVERY  14  DAYS   fenofibrate (TRICOR) 145 MG tablet TAKE 1 TABLET BY MOUTH AT BEDTIME   gabapentin (NEURONTIN) 600 MG tablet TAKE ONE TABLET BY MOUTH FOUR TIMES DAILY (Patient not taking: Reported on 11/14/2021)   guaiFENesin-codeine (CHERATUSSIN AC) 100-10 MG/5ML syrup Take 5 mLs by mouth 3 (three) times daily as needed. (Patient not taking: Reported on 11/14/2021)   insulin degludec (TRESIBA FLEXTOUCH) 100 UNIT/ML FlexTouch Pen INJECT 40 UNITS SUBCUTANEOUSLY ONCE DAILY   lidocaine (LIDODERM) 5 % Place 1 patch onto the skin daily. Remove  & Discard patch within 12 hours or as directed by MD   meloxicam (MOBIC) 15 MG tablet Take 1 tablet (15 mg total) by mouth daily.   metFORMIN (GLUCOPHAGE) 1000 MG tablet Take 1 tablet (1,000 mg total) by mouth 2 (two) times daily.   metoprolol succinate (TOPROL-XL) 25 MG 24 hr tablet Take 1 tablet (25 mg total) by mouth daily.   mupirocin ointment (BACTROBAN) 2 % Apply topically 2 (two) times daily.   nitroGLYCERIN (NITROSTAT) 0.4 MG SL tablet Place 1 tablet (0.4 mg total) under the tongue every 5 (five) minutes as needed for chest pain.   ONETOUCH VERIO test strip 1 each by Other route daily.   Semaglutide,0.25 or 0.5MG /DOS, (OZEMPIC, 0.25 OR 0.5 MG/DOSE,) 2 MG/1.5ML SOPN Inject 0.5 mg into the skin once a week.   vitamin C (VITAMIN C) 500 MG tablet Take 1 tablet (500 mg total) by mouth 2 (two) times daily.   zolpidem (AMBIEN) 5 MG tablet Take 1 tablet (5 mg total) by mouth at bedtime as needed for sleep.   No facility-administered encounter medications on file as of 12/30/2021.  Recent Relevant Labs: Lab Results  Component Value Date/Time   HGBA1C 8.5 (H) 12/25/2021 09:00 AM   HGBA1C 9.1 (H) 04/24/2021 09:12 AM   MICROALBUR 80 06/14/2020 09:41 AM    Kidney Function Lab Results  Component Value Date/Time   CREATININE 0.98 12/25/2021 09:00 AM   CREATININE 1.00 04/24/2021 09:12 AM   GFRNONAA 63 06/26/2020 04:41 PM   GFRAA 72 06/26/2020 04:41 PM  12-30-2021: 1st attempt left VM 01-02-2022: 2nd attempt left VM 01-07-2022: 3rd attempt left VM  Adherence Review: Is the patient currently on a STATIN medication? Yes Is the patient currently on ACE/ARB medication? No Does the patient have >5 day gap between last estimated fill dates? Yes  Care Gaps: Last eye exam / Retinopathy Screening? 12-25-2021 Last Annual Wellness Visit? None Last Diabetic Foot Exam? None   Star Rating Drugs: Ozempic 0.25 mg- Patient assistance Metformin 1000 mg- Last filled 12-17-2021 30 DS  [facility] Atorvastatin 40 mg- Last filled 09-06-2021 90 [facility] 09-06-2021 90 DS. Patient needs refills sent request to pool)  [name] [facility] Clinical Pharmacist Assistant [phone]

## 2021-12-30 NOTE — Chronic Care Management (AMB) (Signed)
error 

## 2022-01-09 ENCOUNTER — Other Ambulatory Visit: Payer: Self-pay

## 2022-01-09 MED ORDER — FREESTYLE LIBRE 14 DAY SENSOR MISC: 6 refills | Status: DC

## 2022-01-19 DIAGNOSIS — J9811 Atelectasis: Secondary | ICD-10-CM | POA: Diagnosis not present

## 2022-01-19 DIAGNOSIS — R079 Chest pain, unspecified: Secondary | ICD-10-CM | POA: Diagnosis not present

## 2022-01-20 DIAGNOSIS — E785 Hyperlipidemia, unspecified: Secondary | ICD-10-CM | POA: Diagnosis not present

## 2022-01-20 DIAGNOSIS — I2510 Atherosclerotic heart disease of native coronary artery without angina pectoris: Secondary | ICD-10-CM | POA: Diagnosis not present

## 2022-01-20 DIAGNOSIS — N179 Acute kidney failure, unspecified: Secondary | ICD-10-CM | POA: Diagnosis not present

## 2022-01-20 DIAGNOSIS — I517 Cardiomegaly: Secondary | ICD-10-CM | POA: Diagnosis not present

## 2022-01-20 DIAGNOSIS — I249 Acute ischemic heart disease, unspecified: Secondary | ICD-10-CM | POA: Diagnosis not present

## 2022-01-20 DIAGNOSIS — E119 Type 2 diabetes mellitus without complications: Secondary | ICD-10-CM | POA: Diagnosis not present

## 2022-01-20 DIAGNOSIS — R079 Chest pain, unspecified: Secondary | ICD-10-CM | POA: Diagnosis not present

## 2022-01-20 DIAGNOSIS — I10 Essential (primary) hypertension: Secondary | ICD-10-CM | POA: Diagnosis not present

## 2022-01-20 DIAGNOSIS — R0602 Shortness of breath: Secondary | ICD-10-CM | POA: Diagnosis not present

## 2022-01-20 DIAGNOSIS — I25119 Atherosclerotic heart disease of native coronary artery with unspecified angina pectoris: Secondary | ICD-10-CM | POA: Diagnosis not present

## 2022-01-21 DIAGNOSIS — R079 Chest pain, unspecified: Secondary | ICD-10-CM | POA: Diagnosis present

## 2022-01-21 DIAGNOSIS — E559 Vitamin D deficiency, unspecified: Secondary | ICD-10-CM | POA: Diagnosis not present

## 2022-01-21 DIAGNOSIS — Z6830 Body mass index (BMI) 30.0-30.9, adult: Secondary | ICD-10-CM | POA: Diagnosis not present

## 2022-01-21 DIAGNOSIS — I25119 Atherosclerotic heart disease of native coronary artery with unspecified angina pectoris: Secondary | ICD-10-CM | POA: Diagnosis not present

## 2022-01-21 DIAGNOSIS — M1990 Unspecified osteoarthritis, unspecified site: Secondary | ICD-10-CM | POA: Diagnosis not present

## 2022-01-21 DIAGNOSIS — I249 Acute ischemic heart disease, unspecified: Secondary | ICD-10-CM | POA: Diagnosis not present

## 2022-01-21 DIAGNOSIS — E119 Type 2 diabetes mellitus without complications: Secondary | ICD-10-CM | POA: Diagnosis not present

## 2022-01-21 DIAGNOSIS — E7800 Pure hypercholesterolemia, unspecified: Secondary | ICD-10-CM | POA: Diagnosis not present

## 2022-01-21 DIAGNOSIS — Z79899 Other long term (current) drug therapy: Secondary | ICD-10-CM | POA: Diagnosis not present

## 2022-01-21 DIAGNOSIS — E785 Hyperlipidemia, unspecified: Secondary | ICD-10-CM | POA: Diagnosis not present

## 2022-01-21 DIAGNOSIS — I517 Cardiomegaly: Secondary | ICD-10-CM | POA: Diagnosis not present

## 2022-01-21 DIAGNOSIS — I10 Essential (primary) hypertension: Secondary | ICD-10-CM | POA: Diagnosis not present

## 2022-01-21 DIAGNOSIS — Z7984 Long term (current) use of oral hypoglycemic drugs: Secondary | ICD-10-CM | POA: Diagnosis not present

## 2022-01-21 DIAGNOSIS — R748 Abnormal levels of other serum enzymes: Secondary | ICD-10-CM | POA: Diagnosis not present

## 2022-01-21 DIAGNOSIS — Z794 Long term (current) use of insulin: Secondary | ICD-10-CM | POA: Diagnosis not present

## 2022-01-21 DIAGNOSIS — Z87891 Personal history of nicotine dependence: Secondary | ICD-10-CM | POA: Diagnosis not present

## 2022-01-21 DIAGNOSIS — I252 Old myocardial infarction: Secondary | ICD-10-CM | POA: Diagnosis not present

## 2022-01-21 DIAGNOSIS — Z20822 Contact with and (suspected) exposure to covid-19: Secondary | ICD-10-CM | POA: Diagnosis not present

## 2022-01-21 DIAGNOSIS — Z7982 Long term (current) use of aspirin: Secondary | ICD-10-CM | POA: Diagnosis not present

## 2022-01-21 DIAGNOSIS — N179 Acute kidney failure, unspecified: Secondary | ICD-10-CM | POA: Diagnosis not present

## 2022-01-21 DIAGNOSIS — E669 Obesity, unspecified: Secondary | ICD-10-CM | POA: Diagnosis not present

## 2022-01-21 DIAGNOSIS — Z955 Presence of coronary angioplasty implant and graft: Secondary | ICD-10-CM | POA: Diagnosis not present

## 2022-01-21 DIAGNOSIS — I2510 Atherosclerotic heart disease of native coronary artery without angina pectoris: Secondary | ICD-10-CM | POA: Diagnosis not present

## 2022-01-22 ENCOUNTER — Telehealth: Payer: Self-pay

## 2022-01-22 ENCOUNTER — Ambulatory Visit (HOSPITAL_COMMUNITY)
Admission: RE | Admit: 2022-01-22 | Discharge: 2022-01-22 | Disposition: A | Discharge status: Home / Self Care | Payer: HMO | Attending: Cardiovascular Disease | Admitting: Cardiovascular Disease

## 2022-01-22 ENCOUNTER — Encounter (HOSPITAL_COMMUNITY)
Admission: RE | Disposition: A | Discharge status: Home / Self Care | Payer: Self-pay | Source: Home / Self Care | Attending: Cardiovascular Disease

## 2022-01-22 DIAGNOSIS — E119 Type 2 diabetes mellitus without complications: Secondary | ICD-10-CM | POA: Insufficient documentation

## 2022-01-22 DIAGNOSIS — Z955 Presence of coronary angioplasty implant and graft: Secondary | ICD-10-CM | POA: Diagnosis not present

## 2022-01-22 DIAGNOSIS — I252 Old myocardial infarction: Secondary | ICD-10-CM | POA: Insufficient documentation

## 2022-01-22 DIAGNOSIS — R079 Chest pain, unspecified: Secondary | ICD-10-CM | POA: Diagnosis not present

## 2022-01-22 DIAGNOSIS — I2510 Atherosclerotic heart disease of native coronary artery without angina pectoris: Secondary | ICD-10-CM | POA: Diagnosis not present

## 2022-01-22 DIAGNOSIS — I10 Essential (primary) hypertension: Secondary | ICD-10-CM | POA: Diagnosis not present

## 2022-01-22 DIAGNOSIS — E785 Hyperlipidemia, unspecified: Secondary | ICD-10-CM | POA: Diagnosis not present

## 2022-01-22 DIAGNOSIS — I249 Acute ischemic heart disease, unspecified: Secondary | ICD-10-CM | POA: Diagnosis not present

## 2022-01-22 HISTORY — PX: LEFT HEART CATH AND CORONARY ANGIOGRAPHY: CATH118249

## 2022-01-22 SURGERY — LEFT HEART CATH AND CORONARY ANGIOGRAPHY
Anesthesia: LOCAL

## 2022-01-22 MED ORDER — SODIUM CHLORIDE 0.9 % IV SOLN: 250.0000 mL | INTRAVENOUS | Status: DC | PRN

## 2022-01-22 MED ORDER — FENTANYL CITRATE (PF) 100 MCG/2ML IJ SOLN
INTRAMUSCULAR | Status: AC
  Filled 2022-01-22: qty 2

## 2022-01-22 MED ORDER — ASPIRIN 81 MG PO TBEC: 81.0000 mg | DELAYED_RELEASE_TABLET | Freq: Every day | ORAL | Status: DC

## 2022-01-22 MED ORDER — SODIUM CHLORIDE 0.9% FLUSH: 3.0000 mL | Freq: Two times a day (BID) | INTRAVENOUS | Status: DC

## 2022-01-22 MED ORDER — SEMAGLUTIDE (1 MG/DOSE) 4 MG/3ML ~~LOC~~ SOPN
1.0000 mg | PEN_INJECTOR | SUBCUTANEOUS | Status: DC
Start: 2022-01-22 — End: 2022-01-22

## 2022-01-22 MED ORDER — HEPARIN SODIUM (PORCINE) 1000 UNIT/ML IJ SOLN
INTRAMUSCULAR | Status: AC
  Filled 2022-01-22: qty 10

## 2022-01-22 MED ORDER — LIDOCAINE HCL (PF) 1 % IJ SOLN
INTRAMUSCULAR | Status: AC
  Filled 2022-01-22: qty 30

## 2022-01-22 MED ORDER — ASPIRIN 81 MG PO CHEW: 81.0000 mg | CHEWABLE_TABLET | ORAL | Status: DC

## 2022-01-22 MED ORDER — HEPARIN (PORCINE) IN NACL 1000-0.9 UT/500ML-% IV SOLN
INTRAVENOUS | Status: DC | PRN
  Administered 2022-01-22 (×2): 500 mL

## 2022-01-22 MED ORDER — LIDOCAINE HCL (PF) 1 % IJ SOLN
INTRAMUSCULAR | Status: DC | PRN
  Administered 2022-01-22: 2 mL via INTRADERMAL

## 2022-01-22 MED ORDER — IOHEXOL 350 MG/ML SOLN
INTRAVENOUS | Status: DC | PRN
  Administered 2022-01-22: 50 mL

## 2022-01-22 MED ORDER — ONDANSETRON HCL 4 MG/2ML IJ SOLN: 4.0000 mg | Freq: Four times a day (QID) | INTRAMUSCULAR | Status: DC | PRN

## 2022-01-22 MED ORDER — NITROGLYCERIN 0.4 MG SL SUBL: 0.4000 mg | SUBLINGUAL_TABLET | SUBLINGUAL | Status: DC | PRN

## 2022-01-22 MED ORDER — ZOLPIDEM TARTRATE 5 MG PO TABS: 5.0000 mg | ORAL_TABLET | Freq: Every evening | ORAL | Status: DC | PRN

## 2022-01-22 MED ORDER — PANTOPRAZOLE SODIUM 40 MG PO TBEC: 40.0000 mg | DELAYED_RELEASE_TABLET | Freq: Every day | ORAL | Status: DC

## 2022-01-22 MED ORDER — ALBUTEROL SULFATE HFA 108 (90 BASE) MCG/ACT IN AERS: 2.0000 | INHALATION_SPRAY | Freq: Four times a day (QID) | RESPIRATORY_TRACT | Status: DC | PRN

## 2022-01-22 MED ORDER — MIDAZOLAM HCL 2 MG/2ML IJ SOLN
INTRAMUSCULAR | Status: DC | PRN
  Administered 2022-01-22: 1 mg via INTRAVENOUS

## 2022-01-22 MED ORDER — ASCORBIC ACID 500 MG PO TABS: 500.0000 mg | ORAL_TABLET | Freq: Two times a day (BID) | ORAL | Status: DC

## 2022-01-22 MED ORDER — SODIUM CHLORIDE 0.9 % WEIGHT BASED INFUSION: 1.0000 mL/kg/h | INTRAVENOUS | Status: DC

## 2022-01-22 MED ORDER — GLUCOSE BLOOD VI STRP: 1.0000 | ORAL_STRIP | Freq: Every day | Status: DC

## 2022-01-22 MED ORDER — GABAPENTIN 600 MG PO TABS: 600.0000 mg | ORAL_TABLET | Freq: Four times a day (QID) | ORAL | Status: DC

## 2022-01-22 MED ORDER — MUPIROCIN 2 % EX OINT: TOPICAL_OINTMENT | Freq: Two times a day (BID) | CUTANEOUS | Status: DC

## 2022-01-22 MED ORDER — HEPARIN (PORCINE) IN NACL 1000-0.9 UT/500ML-% IV SOLN
INTRAVENOUS | Status: AC
  Filled 2022-01-22: qty 1000

## 2022-01-22 MED ORDER — SODIUM CHLORIDE 0.9 % WEIGHT BASED INFUSION: 3.0000 mL/kg/h | INTRAVENOUS | Status: AC

## 2022-01-22 MED ORDER — MELOXICAM 15 MG PO TABS: 15.0000 mg | ORAL_TABLET | Freq: Every day | ORAL | Status: DC

## 2022-01-22 MED ORDER — SODIUM CHLORIDE 0.9 % IV SOLN: INTRAVENOUS | Status: AC

## 2022-01-22 MED ORDER — ONDANSETRON HCL 4 MG PO TABS: 4.0000 mg | ORAL_TABLET | Freq: Three times a day (TID) | ORAL | Status: DC | PRN

## 2022-01-22 MED ORDER — MIDAZOLAM HCL 2 MG/2ML IJ SOLN
INTRAMUSCULAR | Status: AC
  Filled 2022-01-22: qty 2

## 2022-01-22 MED ORDER — VERAPAMIL HCL 2.5 MG/ML IV SOLN
INTRAVENOUS | Status: AC
  Filled 2022-01-22: qty 2

## 2022-01-22 MED ORDER — SODIUM CHLORIDE 0.9% FLUSH: 3.0000 mL | INTRAVENOUS | Status: DC | PRN

## 2022-01-22 MED ORDER — INSULIN DEGLUDEC 100 UNIT/ML ~~LOC~~ SOPN: 40.0000 [IU] | PEN_INJECTOR | SUBCUTANEOUS | Status: DC

## 2022-01-22 MED ORDER — VITAMIN D3 25 MCG PO TABS
1000.0000 [IU] | ORAL_TABLET | Freq: Every day | ORAL | Status: DC
Start: 2022-01-22 — End: 2022-01-22

## 2022-01-22 MED ORDER — LISINOPRIL 2.5 MG PO TABS: 2.5000 mg | ORAL_TABLET | Freq: Every day | ORAL | Status: DC

## 2022-01-22 MED ORDER — HEPARIN SODIUM (PORCINE) 1000 UNIT/ML IJ SOLN
INTRAMUSCULAR | Status: DC | PRN
  Administered 2022-01-22: 4000 [IU] via INTRAVENOUS

## 2022-01-22 MED ORDER — VERAPAMIL HCL 2.5 MG/ML IV SOLN
INTRAVENOUS | Status: DC | PRN
  Administered 2022-01-22: 10 mL via INTRA_ARTERIAL

## 2022-01-22 MED ORDER — FENTANYL CITRATE (PF) 100 MCG/2ML IJ SOLN
INTRAMUSCULAR | Status: DC | PRN
  Administered 2022-01-22: 50 ug via INTRAVENOUS

## 2022-01-22 MED ORDER — ATORVASTATIN CALCIUM 40 MG PO TABS: 40.0000 mg | ORAL_TABLET | Freq: Every day | ORAL | Status: DC

## 2022-01-22 MED ORDER — GLUCAGON 1 MG/0.2ML ~~LOC~~ SOAJ: 1.0000 | SUBCUTANEOUS | Status: DC | PRN

## 2022-01-22 MED ORDER — ACETAMINOPHEN 325 MG PO TABS: 650.0000 mg | ORAL_TABLET | ORAL | Status: DC | PRN

## 2022-01-22 MED ORDER — METOPROLOL SUCCINATE ER 25 MG PO TB24
25.0000 mg | ORAL_TABLET | Freq: Every day | ORAL | Status: DC
Start: 2022-01-22 — End: 2022-01-22

## 2022-01-22 SURGICAL SUPPLY — 11 items
BAND CMPR LRG ZPHR (HEMOSTASIS) ×1
BAND ZEPHYR COMPRESS 30 LONG (HEMOSTASIS) IMPLANT
CATH INFINITI 5FR JK (CATHETERS) IMPLANT
GLIDESHEATH SLEND SS 6F .021 (SHEATH) IMPLANT
GUIDEWIRE INQWIRE 1.5J.035X260 (WIRE) IMPLANT
INQWIRE 1.5J .035X260CM (WIRE) ×1
KIT HEART LEFT (KITS) ×1 IMPLANT
PACK CARDIAC CATHETERIZATION (CUSTOM PROCEDURE TRAY) ×1 IMPLANT
SYR MEDRAD MARK 7 150ML (SYRINGE) ×1 IMPLANT
TRANSDUCER W/STOPCOCK (MISCELLANEOUS) ×1 IMPLANT
TUBING CIL FLEX 10 FLL-RA (TUBING) ×1 IMPLANT

## 2022-01-22 NOTE — Chronic Care Management (AMB) (Signed)
[employer] patient assistance program notification:  120- day supply of Ozempic 0.25/0.5 mg will be filled on 02/08/2022 and should arrive to the office in 10-14 business days.    Billee Cashing, CMA Clinical Pharmacist Assistant [phone]

## 2022-01-22 NOTE — Progress Notes (Signed)
Patient was given discharge instructions. He verbalized understanding. 

## 2022-01-22 NOTE — Progress Notes (Signed)
Received by [facility] to [facility]lab holding area.

## 2022-01-22 NOTE — Discharge Summary (Signed)
Discharge Summary    Patient ID: Mitchell Hammond MRN: [id number]; DOB: 10/18/1955  Admit date: 01/22/2022 Discharge date: 01/22/2022  PCP:  Abigail Miyamoto, MD   [facility] Providers Cardiologist:  Garwin Brothers, MD     Discharge Diagnoses    Active Problems:   Chest pain of uncertain etiology   Diagnostic Studies/Procedures    Cath: 01/22/22    Mid LM to Dist LM lesion is 10% stenosed.   Prox LAD lesion is 10% stenosed.   Mid LAD-1 lesion is 30% stenosed.   Prox Cx lesion is 30% stenosed.   Prox RCA to Mid RCA lesion is 10% stenosed.   Dist RCA lesion is 20% stenosed.   1st Mrg lesion is 20% stenosed.   Non-stenotic Mid LAD-2 lesion was previously treated.   The left ventricular systolic function is normal.   LV end diastolic pressure is mildly elevated.   The left ventricular ejection fraction is 55-65% by visual estimate.   1.  Widely patent LAD stent with no significant restenosis.  Mild nonobstructive coronary artery disease and no significant change since most recent cardiac catheterization in 2022.  The right coronary artery is moderately ectatic. 2.  Normal LV systolic function and mildly elevated left ventricular end-diastolic pressure at 13 mmHg.   Recommendations: The chest pain is likely noncardiac. Continue medical therapy.   Diagnostic Dominance: Right  _____________   History of Present Illness     Mitchell Hammond is a 66 y.o. male with hypertension, hyperlipidemia, diabetes, CAD with mLAD stent '09 (nonobstructive multivessel disease)  He has been followed by Dr. Tomie China as an outpatient.  Underwent cardiac catheterization 06/2020 showing patent mid LAD stent with mild nonobstructive multivessel disease with luminal irregularities.  Normal LV function without focal segmental wall motion abnormality, LVEF of 55 to 60%.  He was treated medically.  Presented to [facility] on 9/3 with complaints of prolonged chest pain, shoulder pain  and neck and jaw pain.  Labs on admission showed sodium 139, potassium 3.4, creatinine 1.3, troponin I 0.03>> 0.03>> 0.04, WBC 8.2, hemoglobin 13.9, hemoglobin A1c 7.9.  EKG showed sinus rhythm, 78 bpm no acute ST/T wave abnormalities.  Chest x-ray with mild bibasilar atelectasis.  CT angio chest with no evidence of PE.  Echocardiogram 9/5 showed LVEF of 55 to 60%, no regional wall motion abnormality, mild LVH, severely dilated left atrium. He was seen by cardiology, Dr. Dulce Sellar with recommendations to transfer to [facility] for further evaluation with cardiac catheterization.  Hospital Course     Underwent cardiac catheterization noted above with widely patent stent with no significant in-stent restenosis.  Mild nonobstructive coronary disease with no significant change since prior cath in 2020. Recommendations to continue medical therapy.  It was felt his chest pain was noncardiac.  He was continued on his home medications without significant change.  Seen by Dr. Kirke Corin and deemed stable for discharge.  Follow-up arranged in the office.  Did the patient have an acute coronary syndrome (MI, NSTEMI, STEMI, etc) this admission?:  No                               Did the patient have a percutaneous coronary intervention (stent / angioplasty)?:  No.    _____________  Discharge Vitals Blood pressure 124/69, pulse 71, resp. rate 16, weight 81.6 kg, SpO2 95 %.  Filed Weights   01/22/22 1136  Weight: 81.6 kg  Labs & Radiologic Studies    CBC No results for input(s): "WBC", "NEUTROABS", "HGB", "HCT", "MCV", "PLT" in the last 72 hours. Basic Metabolic Panel No results for input(s): "NA", "K", "CL", "CO2", "GLUCOSE", "BUN", "CREATININE", "CALCIUM", "MG", "PHOS" in the last 72 hours. Liver Function Tests No results for input(s): "AST", "ALT", "ALKPHOS", "BILITOT", "PROT", "ALBUMIN" in the last 72 hours. No results for input(s): "LIPASE", "AMYLASE" in the last 72 hours. High Sensitivity Troponin:    No results for input(s): "TROPONINIHS" in the last 720 hours.  BNP Invalid input(s): "POCBNP" D-Dimer No results for input(s): "DDIMER" in the last 72 hours. Hemoglobin A1C No results for input(s): "HGBA1C" in the last 72 hours. Fasting Lipid Panel No results for input(s): "CHOL", "HDL", "LDLCALC", "TRIG", "CHOLHDL", "LDLDIRECT" in the last 72 hours. Thyroid Function Tests No results for input(s): "TSH", "T4TOTAL", "T3FREE", "THYROIDAB" in the last 72 hours.  Invalid input(s): "FREET3" _____________  CARDIAC CATHETERIZATION  Result Date: 01/22/2022   Mid LM to Dist LM lesion is 10% stenosed.   Prox LAD lesion is 10% stenosed.   Mid LAD-1 lesion is 30% stenosed.   Prox Cx lesion is 30% stenosed.   Prox RCA to Mid RCA lesion is 10% stenosed.   Dist RCA lesion is 20% stenosed.   1st Mrg lesion is 20% stenosed.   Non-stenotic Mid LAD-2 lesion was previously treated.   The left ventricular systolic function is normal.   LV end diastolic pressure is mildly elevated.   The left ventricular ejection fraction is 55-65% by visual estimate. 1.  Widely patent LAD stent with no significant restenosis.  Mild nonobstructive coronary artery disease and no significant change since most recent cardiac catheterization in 2022.  The right coronary artery is moderately ectatic. 2.  Normal LV systolic function and mildly elevated left ventricular end-diastolic pressure at 13 mmHg. Recommendations: The chest pain is likely noncardiac. Continue medical therapy.   Disposition   Pt is being discharged home today in good condition.  Follow-up Plans & Appointments     Follow-up Information     Revankar, Aundra Dubin, MD Follow up.   Specialty: Cardiology Why: Office will call you with a follow up appt in the next 2-3 business days. Contact information: [address] [place] [address] [phone]                  Discharge Medications   Allergies as of 01/22/2022   No Known Allergies       Medication List     STOP taking these medications    Cheratussin AC 100-10 MG/5ML syrup Generic drug: guaiFENesin-codeine       TAKE these medications    albuterol 108 (90 Base) MCG/ACT inhaler Commonly known as: VENTOLIN HFA Inhale 2 puffs into the lungs every 6 (six) hours as needed for wheezing or shortness of breath.   ascorbic acid 500 MG tablet Commonly known as: VITAMIN C Take 1 tablet (500 mg total) by mouth 2 (two) times daily.   aspirin EC 81 MG tablet Take 81 mg by mouth daily.   atorvastatin 40 MG tablet Commonly known as: LIPITOR TAKE 1 TABLET BY MOUTH AT BEDTIME   cholecalciferol 25 MCG (1000 UNIT) tablet Commonly known as: VITAMIN D3 Take 1,000 Units by mouth daily.   fenofibrate 145 MG tablet Commonly known as: TRICOR TAKE 1 TABLET BY MOUTH AT BEDTIME   FreeStyle Libre 14 Day Sensor Misc USE AS DIRECTED EVERY  14  DAYS   gabapentin 600 MG tablet  Commonly known as: NEURONTIN TAKE ONE TABLET BY MOUTH FOUR TIMES DAILY   Gvoke HypoPen 2-Pack 1 MG/0.2ML Soaj Generic drug: Glucagon Inject 1 Dose into the skin as needed (hypoglycemia).   lidocaine 5 % Commonly known as: Lidoderm Place 1 patch onto the skin daily. Remove & Discard patch within 12 hours or as directed by MD   lisinopril 2.5 MG tablet Commonly known as: Zestril Take 1 tablet (2.5 mg total) by mouth daily.   meloxicam 15 MG tablet Commonly known as: MOBIC Take 1 tablet (15 mg total) by mouth daily.   metFORMIN 1000 MG tablet Commonly known as: GLUCOPHAGE Take 1 tablet (1,000 mg total) by mouth 2 (two) times daily.   metoprolol succinate 25 MG 24 hr tablet Commonly known as: TOPROL-XL Take 1 tablet (25 mg total) by mouth daily.   mupirocin ointment 2 % Commonly known as: BACTROBAN Apply topically 2 (two) times daily.   nitroGLYCERIN 0.4 MG SL tablet Commonly known as: NITROSTAT Place 1 tablet (0.4 mg total) under the tongue every 5 (five) minutes as needed for chest  pain.   ondansetron 4 MG tablet Commonly known as: Zofran Take 1 tablet (4 mg total) by mouth every 8 (eight) hours as needed for nausea or vomiting.   OneTouch Verio test strip Generic drug: glucose blood 1 each by Other route daily.   Semaglutide (1 MG/DOSE) 4 MG/3ML Sopn Inject 1 mg as directed once a week.   Evaristo Bury FlexTouch 100 UNIT/ML FlexTouch Pen Generic drug: insulin degludec INJECT 40 UNITS SUBCUTANEOUSLY ONCE DAILY   zolpidem 5 MG tablet Commonly known as: AMBIEN Take 1 tablet (5 mg total) by mouth at bedtime as needed for sleep.         Outstanding Labs/Studies   N/a   Duration of Discharge Encounter   Greater than 30 minutes including physician time.  Signed, Laverda Page, NP 01/22/2022, 1:09 PM

## 2022-01-22 NOTE — H&P (Signed)
I reviewed the patient's medical records sent from [facility] including cardiology consultation done by Dr. Tomie China.  In brief, he is a 66 year old male with known history of coronary artery disease status post LAD PCI in 2008 after presenting with myocardial infarction.  He also has history of hyperlipidemia and type 2 diabetes.  Most recent cardiac catheterization was done in February 2022 and was personally reviewed by me.  Showed patent LAD stent with no significant obstructive disease. The patient presented to [facility] emergency department with acute onset of substernal chest pain and tightness similar to his previous myocardial infarction.  It radiated to his shoulders and was associated with significant dizziness and presyncope.  EKG showed no ischemic changes.  Troponin was borderline elevated at 0.04 and subsequently 0.05.  Given the patient's previous cardiac history, his convincing symptoms of unstable angina and borderline troponin, he was transferred for left heart catheterization.  His labs there were unremarkable except for mildly elevated creatinine at 1.3 on presentation which improved with hydration with subsequent creatinine of 0.9.

## 2022-01-22 NOTE — Discharge Instructions (Signed)

## 2022-01-23 ENCOUNTER — Encounter: Payer: Self-pay | Admitting: Legal Medicine

## 2022-01-23 ENCOUNTER — Encounter (HOSPITAL_COMMUNITY): Payer: Self-pay | Admitting: Cardiovascular Disease

## 2022-01-23 ENCOUNTER — Telehealth: Payer: Self-pay | Admitting: Cardiology

## 2022-01-23 NOTE — Telephone Encounter (Signed)
LVM on patient's home and cell number to call and schedule appt with RRR/kbl 01/23/2022

## 2022-01-23 NOTE — Telephone Encounter (Signed)
-----   Message from Arty Baumgartner, NP sent at 01/22/2022  1:07 PM EDT ----- Regarding: Follow up appt Needs a hospital follow up appt with Dr. Tomie China in the next 3-4 weeks please.

## 2022-01-27 ENCOUNTER — Ambulatory Visit: Payer: HMO | Attending: Cardiology | Admitting: Cardiology

## 2022-01-27 ENCOUNTER — Encounter: Payer: Self-pay | Admitting: Cardiology

## 2022-01-27 VITALS — BP 148/72 | HR 66 | Ht 65.0 in | Wt 183.4 lb

## 2022-01-27 DIAGNOSIS — E782 Mixed hyperlipidemia: Secondary | ICD-10-CM | POA: Diagnosis not present

## 2022-01-27 DIAGNOSIS — E669 Obesity, unspecified: Secondary | ICD-10-CM | POA: Diagnosis not present

## 2022-01-27 DIAGNOSIS — I10 Essential (primary) hypertension: Secondary | ICD-10-CM | POA: Diagnosis not present

## 2022-01-27 DIAGNOSIS — I25118 Atherosclerotic heart disease of native coronary artery with other forms of angina pectoris: Secondary | ICD-10-CM | POA: Diagnosis not present

## 2022-01-27 DIAGNOSIS — E088 Diabetes mellitus due to underlying condition with unspecified complications: Secondary | ICD-10-CM | POA: Diagnosis not present

## 2022-01-27 MED ORDER — NITROGLYCERIN 0.4 MG SL SUBL: 0.4000 mg | SUBLINGUAL_TABLET | SUBLINGUAL | 11 refills | Status: DC | PRN

## 2022-01-27 NOTE — Progress Notes (Signed)
Cardiology Office Note:    Date:  01/27/2022   ID:  Mitchell Hammond, DOB [birth year], MRN [id number]  PCP:  Abigail Miyamoto, MD  Cardiologist:  Garwin Brothers, MD   Referring MD: Abigail Miyamoto,*    ASSESSMENT:    1. Essential hypertension   2. Coronary artery disease of native artery of native heart with stable angina pectoris (HCC)   3. Mixed hyperlipidemia   4. Diabetes mellitus due to underlying condition with unspecified complications (HCC)   5. Obesity (BMI 30.0-34.9)    PLAN:    In order of problems listed above:  Coronary artery disease: Secondary prevention stressed with the patient.  Importance of compliance with diet medication stressed any vocalized understanding.  He was advised to ambulate to the best of his ability. Essential hypertension: Blood pressure stable and diet was emphasized.  Lifestyle modification urged. Diabetes mellitus: Managed by primary care.  Hemoglobin A1c is elevated and I discussed this with him at length. Mixed dyslipidemia.: On lipid-lowering medications followed by [place].  Lipids were reviewed and I counseled him about this. Renal insufficiency: Patient has had exposure to radiocontrast dye with CT scan and coronary angiography and we will do a Chem-7.  This will be followed by primary care. Patient will be seen in follow-up appointment in 6 months or earlier if the patient has any concerns    Medication Adjustments/Labs and Tests Ordered: Current medicines are reviewed at length with the patient today.  Concerns regarding medicines are outlined above.  No orders of the defined types were placed in this encounter.  No orders of the defined types were placed in this encounter.    No chief complaint on file.    History of Present Illness:    Mitchell Hammond is a 66 y.o. male.  Patient has past medical history of coronary artery disease, essential hypertension, dyslipidemia and diabetes mellitus.  He recently had chest pain and  went to [facility] and was transferred to [facility].  I reviewed records.  He also has renal insufficiency.  He is evaluation was unremarkable and coronary angiography report is discussed below.  CT did not find any pulmonary embolism.  At the time of my evaluation, the patient is alert awake oriented and in no distress.  He cannot ambulate much of because of orthopedic issues affecting the knee.  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   Diabetes mellitus due to underlying condition with unspecified complications (HCC) 12/26/2015   Diabetic polyneuropathy associated with type 2 diabetes mellitus (HCC) 08/20/2016   Discitis    Essential hypertension 12/26/2015   Fracture, tibial plateau 12/18/2014   Hypertension    IDDM (insulin dependent diabetes mellitus)    type 2   Metatarsalgia of both feet 08/20/2016   MI (myocardial infarction) (HCC)    x 2, Multi-link Vision 3.5 x 15 mm BMS in 2009 (mid LAD), balloon 2015   Mixed hyperlipidemia 06/14/2020   Obesity (BMI 30.0-34.9) 06/26/2020   Tibial plateau fracture 12/18/2014   Vitamin D insufficiency     Past Surgical History:  Procedure Laterality Date   BACK SURGERY     2014   CARDIAC CATHETERIZATION     x 2,  2009 & 2015   COLONOSCOPY     CORONARY ANGIOPLASTY     stent - 2008, balloon - 2015   FASCIOTOMY Right 12/19/2014   Procedure: ANTERIOR COMPARTMENTAL FASCIOTOMY;  Surgeon: Myrene Galas, MD;  Location: [facility] OR;  Service: Orthopedics;  Laterality: Right;   HAND SURGERY Left    HARDWARE REMOVAL Right 07/24/2015   Procedure: HARDWARE REMOVAL RIGHT TIBIA;  Surgeon: Myrene Galas, MD;  Location: [facility] OR;  Service: Orthopedics;  Laterality: Right;   KNEE ARTHROSCOPY Right 07/24/2015    Procedure: RIGHT ARTHROSCOPY KNEE WITH PARTIAL SYNOVECTOMY AND MEDIAL MENISCECTOMY;  Surgeon: Myrene Galas, MD;  Location: [facility] OR;  Service: Orthopedics;  Laterality: Right;   LEFT HEART CATH AND CORONARY ANGIOGRAPHY N/A 07/02/2020   Procedure: LEFT HEART CATH AND CORONARY ANGIOGRAPHY;  Surgeon: Lennette Bihari, MD;  Location: [facility] INVASIVE CV LAB;  Service: Cardiovascular;  Laterality: N/A;   LEFT HEART CATH AND CORONARY ANGIOGRAPHY N/A 01/22/2022   Procedure: LEFT HEART CATH AND CORONARY ANGIOGRAPHY;  Surgeon: Iran Ouch, MD;  Location: [facility] INVASIVE CV LAB;  Service: Cardiovascular;  Laterality: N/A;   ORIF TIBIA PLATEAU Right 12/19/2014   Procedure: OPEN REDUCTION INTERNAL FIXATION (ORIF) RIGHT TIBIAL PLATEAU;  Surgeon: Myrene Galas, MD;  Location: [facility] OR;  Service: Orthopedics;  Laterality: Right;   TONSILLECTOMY     VASECTOMY      Current Medications: Current Meds  Medication Sig   aspirin EC 81 MG tablet Take 81 mg by mouth daily.   atorvastatin (LIPITOR) 40 MG tablet TAKE 1 TABLET BY MOUTH AT BEDTIME   cholecalciferol (VITAMIN D3) 25 MCG (1000 UNIT) tablet Take 1,000 Units by mouth daily.   Continuous Blood Gluc Sensor (FREESTYLE LIBRE 14 DAY SENSOR) MISC USE AS DIRECTED EVERY  14  DAYS   fenofibrate (TRICOR) 145 MG tablet TAKE 1 TABLET BY MOUTH AT BEDTIME   Glucagon (GVOKE HYPOPEN 2-PACK) 1 MG/0.2ML SOAJ Inject 1 Dose into the skin as needed (hypoglycemia).   insulin degludec (TRESIBA FLEXTOUCH) 100 UNIT/ML FlexTouch Pen INJECT 40 UNITS SUBCUTANEOUSLY ONCE DAILY   lisinopril (ZESTRIL) 2.5 MG tablet Take 1 tablet (2.5 mg total) by mouth daily.   metFORMIN (GLUCOPHAGE) 1000 MG tablet Take 1 tablet (1,000 mg total) by mouth 2 (two) times daily.   metoprolol succinate (TOPROL-XL) 25 MG 24 hr tablet Take 1 tablet (25 mg total) by mouth daily.   mupirocin ointment (BACTROBAN) 2 % Apply topically 2 (two) times daily.   nitroGLYCERIN (NITROSTAT) 0.4 MG SL tablet Place 1 tablet (0.4 mg total)  under the tongue every 5 (five) minutes as needed for chest pain.   ondansetron (ZOFRAN) 4 MG tablet Take 1 tablet (4 mg total) by mouth every 8 (eight) hours as needed for nausea or vomiting.   ONETOUCH VERIO test strip 1 each by Other route daily.   Semaglutide, 1 MG/DOSE, 4 MG/3ML SOPN Inject 1 mg as directed once a week.   vitamin C (VITAMIN C) 500 MG tablet Take 1 tablet (500 mg total) by mouth 2 (two) times daily.   zolpidem (AMBIEN) 5 MG tablet Take 1 tablet (5 mg total) by mouth at bedtime as needed for sleep.     Allergies:   Patient has no known allergies.   Social History   Socioeconomic History   Marital status: Married    Spouse name: Not on file   Number of children: 2   Years of education: Not on file   Highest education level: Not on file  Occupational  History   Occupation: disabled  Tobacco Use   Smoking status: Former    Packs/day: 1.00    Types: Cigarettes    Quit date: 02/20/2007    Years since quitting: 14.9   Smokeless tobacco: Never  Vaping Use   Vaping Use: Never used  Substance and Sexual Activity   Alcohol use: No    Alcohol/week: 0.0 standard drinks of alcohol    Comment: None in years   Drug use: No   Sexual activity: Yes    Partners: Female  Other Topics Concern   Not on file  Social History Narrative   Pt lives with wife and daughter in [place].    Social Determinants of Health   Financial Resource Strain: Low Risk  (11/14/2021)   Overall Financial Resource Strain (CARDIA)    Difficulty of Paying Living Expenses: Not hard at all  Food Insecurity: No Food Insecurity (08/29/2020)   Hunger Vital Sign    Worried About Running Out of Food in the Last Year: Never true    Ran Out of Food in the Last Year: Never true  Transportation Needs: No Transportation Needs (11/14/2021)   PRAPARE - Hydrologist (Medical): No    Lack of Transportation (Non-Medical): No  Physical Activity: Not on file  Stress: Not on file   Social Connections: Not on file     Family History: The patient's family history includes CAD in his mother; Diabetes in his mother; Liver disease in his father.  ROS:   Please see the history of present illness.    All other systems reviewed and are negative.  EKGs/Labs/Other Studies Reviewed:    The following studies were reviewed today: LEFT HEART CATH AND CORONARY ANGIOGRAPHY   Conclusion      Mid LM to Dist LM lesion is 10% stenosed.   Prox LAD lesion is 10% stenosed.   Mid LAD-1 lesion is 30% stenosed.   Prox Cx lesion is 30% stenosed.   Prox RCA to Mid RCA lesion is 10% stenosed.   Dist RCA lesion is 20% stenosed.   1st Mrg lesion is 20% stenosed.   Non-stenotic Mid LAD-2 lesion was previously treated.   The left ventricular systolic function is normal.   LV end diastolic pressure is mildly elevated.   The left ventricular ejection fraction is 55-65% by visual estimate.   1.  Widely patent LAD stent with no significant restenosis.  Mild nonobstructive coronary artery disease and no significant change since most recent cardiac catheterization in 2022.  The right coronary artery is moderately ectatic. 2.  Normal LV systolic function and mildly elevated left ventricular end-diastolic pressure at 13 mmHg.   Recommendations: The chest pain is likely noncardiac. Continue medical therapy.   Recent Labs: 12/25/2021: ALT 26; BUN 16; Creatinine, Ser 0.98; Hemoglobin 13.0; Platelets 171; Potassium 4.8; Sodium 138  Recent Lipid Panel    Component Value Date/Time   CHOL 137 12/25/2021 0900   TRIG 98 12/25/2021 0900   HDL 39 (L) 12/25/2021 0900   CHOLHDL 3.5 12/25/2021 0900   LDLCALC 80 12/25/2021 0900    Physical Exam:    VS:  BP (!) 148/72   Pulse 66   Ht 5\' 5"  (1.651 m)   Wt 183 lb 6.4 oz (83.2 kg)   SpO2 96%   BMI 30.52 kg/m     Wt Readings from Last 3 Encounters:  01/27/22 183 lb 6.4 oz (83.2 kg)  01/22/22 180 lb (81.6 kg)  12/25/21  183 lb (83 kg)      GEN: Patient is in no acute distress HEENT: Normal NECK: No JVD; No carotid bruits LYMPHATICS: No lymphadenopathy CARDIAC: Hear sounds regular, 2/6 systolic murmur at the apex. RESPIRATORY:  Clear to auscultation without rales, wheezing or rhonchi  ABDOMEN: Soft, non-tender, non-distended MUSCULOSKELETAL:  No edema; No deformity  SKIN: Warm and dry NEUROLOGIC:  Alert and oriented x 3 PSYCHIATRIC:  Normal affect   Signed, Jenean Lindau, MD  01/27/2022 9:31 AM    [place]

## 2022-01-27 NOTE — Patient Instructions (Signed)
Medication Instructions:  Your physician recommends that you continue on your current medications as directed. Please refer to the Current Medication list given to you today.  *If you need a refill on your cardiac medications before your next appointment, please call your pharmacy*   Lab Work: Your physician recommends that you have a BMET done today in the office.  If you have labs (blood work) drawn today and your tests are completely normal, you will receive your results only by: MyChart Message (if you have MyChart) OR A paper copy in the mail If you have any lab test that is abnormal or we need to change your treatment, we will call you to review the results.   Testing/Procedures: None ordered   Follow-Up: At [facility], you and your health needs are our priority.  As part of our continuing mission to provide you with exceptional heart care, we have created designated Provider Care Teams.  These Care Teams include your primary Cardiologist (physician) and Advanced Practice Providers (APPs -  Physician Assistants and Nurse Practitioners) who all work together to provide you with the care you need, when you need it.  We recommend signing up for the patient portal called "MyChart".  Sign up information is provided on this After Visit Summary.  MyChart is used to connect with patients for Virtual Visits (Telemedicine).  Patients are able to view lab/test results, encounter notes, upcoming appointments, etc.  Non-urgent messages can be sent to your provider as well.   To learn more about what you can do with MyChart, go to [email or web address].    Your next appointment:   6 month(s)  The format for your next appointment:   In Person  Provider:   Belva Crome, MD   Other Instructions NA

## 2022-01-28 LAB — BASIC METABOLIC PANEL
BUN/Creatinine Ratio: 14 (ref 10–24)
BUN: 13 mg/dL (ref 8–27)
CO2: 22 mmol/L (ref 20–29)
Calcium: 9.5 mg/dL (ref 8.6–10.2)
Chloride: 102 mmol/L (ref 96–106)
Creatinine, Ser: 0.93 mg/dL (ref 0.76–1.27)
Glucose: 194 mg/dL — ABNORMAL HIGH (ref 70–99)
Potassium: 4.1 mmol/L (ref 3.5–5.2)
Sodium: 137 mmol/L (ref 134–144)
eGFR: 89 mL/min/{1.73_m2} (ref >59)

## 2022-01-30 ENCOUNTER — Telehealth: Payer: Self-pay

## 2022-01-30 NOTE — Telephone Encounter (Signed)
I called patient for TOC and set up an appointment. I left message on voicemail to call us back.

## 2022-02-04 ENCOUNTER — Encounter: Payer: Self-pay | Admitting: Legal Medicine

## 2022-02-04 ENCOUNTER — Ambulatory Visit (INDEPENDENT_AMBULATORY_CARE_PROVIDER_SITE_OTHER): Payer: HMO | Admitting: Legal Medicine

## 2022-02-04 VITALS — BP 118/64 | HR 83 | Temp 97.5°F | Resp 14 | Ht 65.0 in | Wt 181.0 lb

## 2022-02-04 DIAGNOSIS — I10 Essential (primary) hypertension: Secondary | ICD-10-CM

## 2022-02-04 DIAGNOSIS — I2510 Atherosclerotic heart disease of native coronary artery without angina pectoris: Secondary | ICD-10-CM

## 2022-02-04 DIAGNOSIS — K2100 Gastro-esophageal reflux disease with esophagitis, without bleeding: Secondary | ICD-10-CM | POA: Diagnosis not present

## 2022-02-04 DIAGNOSIS — K219 Gastro-esophageal reflux disease without esophagitis: Secondary | ICD-10-CM | POA: Insufficient documentation

## 2022-02-04 MED ORDER — OMEPRAZOLE 40 MG PO CPDR: 40.0000 mg | DELAYED_RELEASE_CAPSULE | Freq: Every day | ORAL | 3 refills | Status: DC

## 2022-02-04 NOTE — Progress Notes (Signed)
Subjective:  Patient ID: Mitchell Hammond, male    DOB: 08-27-1955  Age: 66 y.o. MRN: [id number]  Chief Complaint  Patient presents with   Hypertension   Hospitalization Follow-up    HPI: transition of care and reconciliation of medicines.  Patient admitted 01/22/2022 to [facility] care for acute coronary syndrome.  MI was ruled out and he was treasfered to [facility] for cardiac catheterizion. From cath data:    Mid LM to Dist LM lesion is 10% stenosed.   Prox LAD lesion is 10% stenosed.   Mid LAD-1 lesion is 30% stenosed.   Prox Cx lesion is 30% stenosed.   Prox RCA to Mid RCA lesion is 10% stenosed.   Dist RCA lesion is 20% stenosed.   1st Mrg lesion is 20% stenosed.   Non-stenotic Mid LAD-2 lesion was previously treated.   The left ventricular systolic function is normal.   LV end diastolic pressure is mildly elevated.   The left ventricular ejection fraction is 55-65% by visual estimate.    He was sent home with no chest pain and will get EGD soon.  He is having no problems.  Gall bladder is good 2 stones in gall bladder but no evidence for for wall thickening.Diabetes was good. Current Outpatient Medications on File Prior to Visit  Medication Sig Dispense Refill   albuterol (VENTOLIN HFA) 108 (90 Base) MCG/ACT inhaler Inhale 2 puffs into the lungs every 6 (six) hours as needed for wheezing or shortness of breath. 8 g 3   aspirin EC 81 MG tablet Take 81 mg by mouth daily.     atorvastatin (LIPITOR) 40 MG tablet TAKE 1 TABLET BY MOUTH AT BEDTIME 90 tablet 1   cholecalciferol (VITAMIN D3) 25 MCG (1000 UNIT) tablet Take 1,000 Units by mouth daily.     Continuous Blood Gluc Sensor (FREESTYLE LIBRE 14 DAY SENSOR) MISC USE AS DIRECTED EVERY  14  DAYS 3 each 6   fenofibrate (TRICOR) 145 MG tablet TAKE 1 TABLET BY MOUTH AT BEDTIME 90 tablet 0   gabapentin (NEURONTIN) 600 MG tablet TAKE ONE TABLET BY MOUTH FOUR TIMES DAILY 360 tablet 5   Glucagon (GVOKE HYPOPEN 2-PACK) 1 MG/0.2ML SOAJ  Inject 1 Dose into the skin as needed (hypoglycemia). 0.2 mL 1   insulin degludec (TRESIBA FLEXTOUCH) 100 UNIT/ML FlexTouch Pen INJECT 40 UNITS SUBCUTANEOUSLY ONCE DAILY 15 mL 3   lidocaine (LIDODERM) 5 % Place 1 patch onto the skin daily. Remove & Discard patch within 12 hours or as directed by MD 30 patch 1   lisinopril (ZESTRIL) 2.5 MG tablet Take 1 tablet (2.5 mg total) by mouth daily. 90 tablet 1   meloxicam (MOBIC) 15 MG tablet Take 1 tablet (15 mg total) by mouth daily. 90 tablet 1   metFORMIN (GLUCOPHAGE) 1000 MG tablet Take 1 tablet (1,000 mg total) by mouth 2 (two) times daily. 60 tablet 0   metoprolol succinate (TOPROL-XL) 25 MG 24 hr tablet Take 1 tablet (25 mg total) by mouth daily. 30 tablet 0   mupirocin ointment (BACTROBAN) 2 % Apply topically 2 (two) times daily. 22 g 6   nitroGLYCERIN (NITROSTAT) 0.4 MG SL tablet Place 1 tablet (0.4 mg total) under the tongue every 5 (five) minutes as needed for chest pain. 25 tablet 11   ondansetron (ZOFRAN) 4 MG tablet Take 1 tablet (4 mg total) by mouth every 8 (eight) hours as needed for nausea or vomiting. 20 tablet 0   ONETOUCH VERIO test strip 1  each by Other route daily. 100 each 2   Semaglutide, 1 MG/DOSE, 4 MG/3ML SOPN Inject 1 mg as directed once a week. 3 mL 1   vitamin C (VITAMIN C) 500 MG tablet Take 1 tablet (500 mg total) by mouth 2 (two) times daily. 60 tablet 2   zolpidem (AMBIEN) 5 MG tablet Take 1 tablet (5 mg total) by mouth at bedtime as needed for sleep. 30 tablet 3   BOOSTRIX 5-2.5-18.5 LF-MCG/0.5 injection      FLUZONE HIGH-DOSE QUADRIVALENT 0.7 ML SUSY      SHINGRIX injection      No current facility-administered medications on file prior to visit.   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) 08/20/2016   Discitis    Essential hypertension 12/26/2015   Fracture, tibial plateau 12/18/2014   Hypertension    IDDM (insulin dependent diabetes mellitus)    type 2   Metatarsalgia of both feet 08/20/2016   MI (myocardial infarction) ([place])    x 2, Multi-link Vision 3.5 x 15 mm BMS in 2009 (mid LAD), balloon 2015   Mixed hyperlipidemia 06/14/2020   Obesity (BMI 30.0-34.9) 06/26/2020   Tibial plateau fracture 12/18/2014   Vitamin D insufficiency    Past Surgical History:  Procedure Laterality Date   BACK SURGERY     2014   CARDIAC CATHETERIZATION     x 2, 2009 & 2015   COLONOSCOPY     CORONARY ANGIOPLASTY     stent - 2008, balloon - 2015   FASCIOTOMY Right 12/19/2014   Procedure: ANTERIOR COMPARTMENTAL FASCIOTOMY;  Surgeon: Altamese Clayton, MD;  Location: [place];  Service: Orthopedics;  Laterality: Right;   HAND SURGERY Left    HARDWARE REMOVAL Right 07/24/2015   Procedure: HARDWARE REMOVAL RIGHT TIBIA;  Surgeon: Altamese Aguanga, MD;  Location: [place];  Service: Orthopedics;  Laterality: Right;   KNEE ARTHROSCOPY Right 07/24/2015   Procedure: RIGHT ARTHROSCOPY KNEE WITH PARTIAL SYNOVECTOMY AND MEDIAL MENISCECTOMY;  Surgeon: Altamese [place], MD;  Location: [place];  Service: Orthopedics;  Laterality: Right;   LEFT HEART CATH AND CORONARY ANGIOGRAPHY N/A 07/02/2020   Procedure: LEFT HEART CATH AND CORONARY ANGIOGRAPHY;  Surgeon: [name], MD;  Location: [place] [facility];  Service: Cardiovascular;  Laterality: N/A;   LEFT HEART CATH AND CORONARY ANGIOGRAPHY N/A 01/22/2022   Procedure: LEFT HEART CATH AND CORONARY ANGIOGRAPHY;  Surgeon: Wellington [place], MD;  Location: [place] [facility];  Service: Cardiovascular;  Laterality: N/A;   ORIF TIBIA PLATEAU Right 12/19/2014   Procedure: OPEN REDUCTION  INTERNAL FIXATION (ORIF) RIGHT TIBIAL PLATEAU;  Surgeon: Altamese [place], MD;  Location: [place];  Service: Orthopedics;  Laterality: Right;   TONSILLECTOMY     VASECTOMY      Family History  Problem Relation Age of Onset   Diabetes Mother    CAD Mother    Liver disease Father    Social History   Socioeconomic History   Marital status: Married    Spouse name: Not on file   Number of children: 2  Years of education: Not on file   Highest education level: Not on file  Occupational History   Occupation: disabled  Tobacco Use   Smoking status: Former    Packs/day: 1.00    Types: Cigarettes    Quit date: 02/20/2007    Years since quitting: 14.9   Smokeless tobacco: Never  Vaping Use   Vaping Use: Never used  Substance and Sexual Activity   Alcohol use: No    Alcohol/week: 0.0 standard drinks of alcohol    Comment: None in years   Drug use: No   Sexual activity: Yes    Partners: Female  Other Topics Concern   Not on file  Social History Narrative   Pt lives with wife and daughter in [place].    Social Determinants of Health   Financial Resource Strain: Low Risk  (11/14/2021)   Overall Financial Resource Strain (CARDIA)    Difficulty of Paying Living Expenses: Not hard at all  Food Insecurity: No Food Insecurity (08/29/2020)   Hunger Vital Sign    Worried About Running Out of Food in the Last Year: Never true    Ran Out of Food in the Last Year: Never true  Transportation Needs: No Transportation Needs (11/14/2021)   PRAPARE - Administrator, Civil Service (Medical): No    Lack of Transportation (Non-Medical): No  Physical Activity: Not on file  Stress: Not on file  Social Connections: Not on file    Review of Systems  Constitutional:  Negative for chills, fatigue, fever and unexpected weight change.  HENT:  Negative for congestion, ear pain, sinus pain and sore throat.   Eyes:  Negative for visual disturbance.  Respiratory:  Negative for cough and  shortness of breath.   Cardiovascular:  Negative for chest pain and palpitations.  Gastrointestinal:  Negative for abdominal pain, blood in stool, constipation, diarrhea, nausea and vomiting.  Endocrine: Negative for polydipsia.  Genitourinary:  Negative for dysuria.  Musculoskeletal:  Negative for back pain.  Skin:  Negative for rash.  Neurological:  Negative for headaches.     Objective:  BP 118/64   Pulse 83   Temp (!) 97.5 F (36.4 C)   Resp 14   Ht 5\' 5"  (1.651 m)   Wt 181 lb (82.1 kg)   SpO2 97%   BMI 30.12 kg/m      02/04/2022    3:19 PM 01/27/2022    9:14 AM 01/22/2022    2:30 PM  BP/Weight  Systolic BP 118 148 128  Diastolic BP 64 72 66  Wt. (Lbs) 181 183.4   BMI 30.12 kg/m2 30.52 kg/m2     Physical Exam Vitals reviewed.  Constitutional:      Appearance: Normal appearance.  HENT:     Head: Normocephalic and atraumatic.     Right Ear: Tympanic membrane normal.     Left Ear: Tympanic membrane normal.     Nose: Nose normal.     Mouth/Throat:     Mouth: Mucous membranes are moist.     Pharynx: Oropharynx is clear.  Eyes:     Extraocular Movements: Extraocular movements intact.     Conjunctiva/sclera: Conjunctivae normal.     Pupils: Pupils are equal, round, and reactive to light.  Cardiovascular:     Rate and Rhythm: Normal rate and regular rhythm.     Pulses: Normal pulses.     Heart sounds: Normal heart sounds. No murmur heard.    No gallop.  Pulmonary:  Effort: Pulmonary effort is normal. No respiratory distress.     Breath sounds: Normal breath sounds. No wheezing.  Abdominal:     General: Abdomen is flat. Bowel sounds are normal.     Palpations: Abdomen is soft.     Tenderness: There is abdominal tenderness (epigastric).  Musculoskeletal:        General: Normal range of motion.     Cervical back: Normal range of motion and neck supple.     Right lower leg: No edema.     Left lower leg: No edema.  Skin:    General: Skin is warm and dry.      Capillary Refill: Capillary refill takes less than 2 seconds.     Findings: Lesion present. No rash.  Neurological:     General: No focal deficit present.     Mental Status: He is alert. Mental status is at baseline.     Gait: Gait normal.  Psychiatric:        Mood and Affect: Mood normal.        Thought Content: Thought content normal.         Lab Results  Component Value Date   WBC 6.0 12/25/2021   HGB 13.0 12/25/2021   HCT 40.2 12/25/2021   PLT 171 12/25/2021   GLUCOSE 194 (H) 01/27/2022   CHOL 137 12/25/2021   TRIG 98 12/25/2021   HDL 39 (L) 12/25/2021   LDLCALC 80 12/25/2021   ALT 26 12/25/2021   AST 16 12/25/2021   NA 137 01/27/2022   K 4.1 01/27/2022   CL 102 01/27/2022   CREATININE 0.93 01/27/2022   BUN 13 01/27/2022   CO2 22 01/27/2022   TSH 2.829 12/18/2014   INR 1.20 12/18/2014   HGBA1C 8.5 (H) 12/25/2021   MICROALBUR 80 06/14/2020      Assessment & Plan:   Problem List Items Addressed This Visit       Cardiovascular and Mediastinum   CAD (coronary artery disease) Patient having occasional chest pain- needs to see cardiology    Essential hypertension - Primary An individual hypertension care plan was established and reinforced today.  The patient's status was assessed using clinical findings on exam and labs or diagnostic tests. The patient's success at meeting treatment goals on disease specific evidence-based guidelines and found to be well controlled. SELF MANAGEMENT: The patient and I together assessed ways to personally work towards obtaining the recommended goals. RECOMMENDATIONS: avoid decongestants found in common cold remedies, decrease consumption of alcohol, perform routine monitoring of BP with home BP cuff, exercise, reduction of dietary salt, take medicines as prescribed, try not to miss doses and quit smoking.  Regular exercise and maintaining a healthy weight is needed.  Stress reduction may help. A CLINICAL SUMMARY including written  plan identify barriers to care unique to individual due to social or financial issues.  We attempt to mutually creat solutions for individual and family understanding.      Digestive   GERD (gastroesophageal reflux disease)   Relevant Medications   omeprazole (PRILOSEC) 40 MG capsule He is having GERD pain and possible cause of his new chest pain, he is to get a EGD  .       Follow-up: Return as scheduled with Carollee Herter.  An After Visit Summary was printed and given to the patient.  Brent Bulla, MD [facility] [phone]

## 2022-02-07 ENCOUNTER — Telehealth: Payer: Self-pay

## 2022-02-07 NOTE — Chronic Care Management (AMB) (Signed)
Chronic Care Management Pharmacy Assistant   Name: Mitchell Hammond  MRN: [id number] DOB: [birth year]  Reason for Encounter: Disease State/ Diabetes   Recent office visits:  02-04-2022 Lillard Anes, MD. START omeprazole 40 mg daily.  12-25-2021 Rip Harbour, NP. Glucose= 246, Total protein= 5.8, Albumin/Globulin Ratio= 2.6. A1C= 8.5. HDL= 39. Microalb/Creat Ratio= 39. Referral placed to [facility].START lidocaine 5 % Place 1 patch onto the skin daily. Remove & Discard patch within 12 hours and meloxicam 15 mg daily.   Recent consult visits:  01-27-2022 Revankar, Reita Cliche, MD (Cardiology). Glucose= 194.  01-22-2022 Revankar, Reita Cliche, MD (Cardiology). LEFT HEART CATH AND CORONARY ANGIOGRAPHY procedure completed.  12-22-2021 Janeann Forehand, [facility] (Urgent care). Visit for dental abscess. START amoxicillin 875 mg twice daily for 10 days.  11-18-2021 Janeann Forehand, [facility] ([facility] care). Visit for nasal congestion/cough. START benzonatate 100 mg 3 times daily PRN, Mucinex 600 mg in morning and night, doxycycline 100 mg in morning and night.  Hospital visits:  None in previous 6 months  Medications: Outpatient Encounter Medications as of 02/07/2022  Medication Sig   albuterol (VENTOLIN HFA) 108 (90 Base) MCG/ACT inhaler Inhale 2 puffs into the lungs every 6 (six) hours as needed for wheezing or shortness of breath.   aspirin EC 81 MG tablet Take 81 mg by mouth daily.   atorvastatin (LIPITOR) 40 MG tablet TAKE 1 TABLET BY MOUTH AT BEDTIME   BOOSTRIX 5-2.5-18.5 LF-MCG/0.5 injection    cholecalciferol (VITAMIN D3) 25 MCG (1000 UNIT) tablet Take 1,000 Units by mouth daily.   Continuous Blood Gluc Sensor (FREESTYLE LIBRE 14 DAY SENSOR) MISC USE AS DIRECTED EVERY  14  DAYS   fenofibrate (TRICOR) 145 MG tablet TAKE 1 TABLET BY MOUTH AT BEDTIME   FLUZONE HIGH-DOSE QUADRIVALENT 0.7 ML SUSY    gabapentin (NEURONTIN) 600 MG tablet TAKE ONE TABLET BY MOUTH FOUR TIMES DAILY   Glucagon (GVOKE  HYPOPEN 2-PACK) 1 MG/0.2ML SOAJ Inject 1 Dose into the skin as needed (hypoglycemia).   insulin degludec (TRESIBA FLEXTOUCH) 100 UNIT/ML FlexTouch Pen INJECT 40 UNITS SUBCUTANEOUSLY ONCE DAILY   lidocaine (LIDODERM) 5 % Place 1 patch onto the skin daily. Remove & Discard patch within 12 hours or as directed by MD   lisinopril (ZESTRIL) 2.5 MG tablet Take 1 tablet (2.5 mg total) by mouth daily.   meloxicam (MOBIC) 15 MG tablet Take 1 tablet (15 mg total) by mouth daily.   metFORMIN (GLUCOPHAGE) 1000 MG tablet Take 1 tablet (1,000 mg total) by mouth 2 (two) times daily.   metoprolol succinate (TOPROL-XL) 25 MG 24 hr tablet Take 1 tablet (25 mg total) by mouth daily.   mupirocin ointment (BACTROBAN) 2 % Apply topically 2 (two) times daily.   nitroGLYCERIN (NITROSTAT) 0.4 MG SL tablet Place 1 tablet (0.4 mg total) under the tongue every 5 (five) minutes as needed for chest pain.   omeprazole (PRILOSEC) 40 MG capsule Take 1 capsule (40 mg total) by mouth daily.   ondansetron (ZOFRAN) 4 MG tablet Take 1 tablet (4 mg total) by mouth every 8 (eight) hours as needed for nausea or vomiting.   ONETOUCH VERIO test strip 1 each by Other route daily.   Semaglutide, 1 MG/DOSE, 4 MG/3ML SOPN Inject 1 mg as directed once a week.   SHINGRIX injection    vitamin C (VITAMIN C) 500 MG tablet Take 1 tablet (500 mg total) by mouth 2 (two) times daily.   zolpidem (AMBIEN) 5 MG tablet Take 1  tablet (5 mg total) by mouth at bedtime as needed for sleep.   No facility-administered encounter medications on file as of 02/07/2022.  Recent Relevant Labs: Lab Results  Component Value Date/Time   HGBA1C 8.5 (H) 12/25/2021 09:00 AM   HGBA1C 9.1 (H) 04/24/2021 09:12 AM   MICROALBUR 80 06/14/2020 09:41 AM    Kidney Function Lab Results  Component Value Date/Time   CREATININE 0.93 01/27/2022 09:51 AM   CREATININE 0.98 12/25/2021 09:00 AM   GFRNONAA 63 06/26/2020 04:41 PM   GFRAA 72 06/26/2020 04:41 PM      02-07-2022:  1st attempt left VM 02-10-2022: 2nd attempt left VM 02-11-2022: 3rd attempt left VM  Adherence Review: Is the patient currently on a STATIN medication? Yes Is the patient currently on ACE/ARB medication? Yes Does the patient have >5 day gap between last estimated fill dates? No  Care Gaps: Last eye exam / Retinopathy Screening? 12-25-2021 Last Annual Wellness Visit? None Last Diabetic Foot Exam? None  Star Rating Drugs: Ozempic 0.25 mg- Patient assistance Metformin 1000 mg- Last filled 01-10-2022 60 DS [facility] Atorvastatin 40 mg- Last filled 12-30-2021 90 DS Lisinopril 2.5 mg- Last filled 12-31-2021 90 DS  [place] [facility] CMA Clinical Pharmacist Assistant [phone]

## 2022-02-10 ENCOUNTER — Other Ambulatory Visit: Payer: Self-pay | Admitting: Legal Medicine

## 2022-02-10 DIAGNOSIS — E1142 Type 2 diabetes mellitus with diabetic polyneuropathy: Secondary | ICD-10-CM

## 2022-03-03 ENCOUNTER — Other Ambulatory Visit: Payer: Self-pay | Admitting: Legal Medicine

## 2022-03-06 ENCOUNTER — Telehealth: Payer: Self-pay

## 2022-03-06 NOTE — Chronic Care Management (AMB) (Signed)
Novo Nordisk patient assistance program notification:  Patient has 0  refill remaining on Ozempic 0.25/0.5 mg and enrollment will expire on 05/18/2022.  No further action required, patient due for 2024 re-enrollment.  Pattricia Boss, [place] Pharmacist Assistant [phone]

## 2022-03-12 ENCOUNTER — Telehealth: Payer: Self-pay

## 2022-03-12 NOTE — Chronic Care Management (AMB) (Unsigned)
Chronic Care Management Pharmacy Assistant   Name: Mitchell Hammond  MRN: [id number] DOB: 1955-07-16  Reason for Encounter: Disease State/ Diabetes  Recent office visits:  02-04-2022 Abigail Miyamoto, MD. START omeprazole 40 mg daily.  12-25-2021 Janie Morning, NP. Glucose= 246, Total protein= 5.8, Albumin/Globulin Ratio= 2.6. A1C= 8.5. HDL= 39. Microalb/Creat Ratio= 39. Referral placed to [facility].START lidocaine 5 % Place 1 patch onto the skin daily. Remove & Discard patch within 12 hours and meloxicam 15 mg daily.  Recent consult visits:  01-27-2022 Revankar, Aundra Dubin, MD (Cardiology). Glucose= 194.   01-22-2022 Revankar, Aundra Dubin, MD (Cardiology). LEFT HEART CATH AND CORONARY ANGIOGRAPHY procedure completed.   12-22-2021 Anastasio Auerbach, [facility] (Urgent care). Visit for dental abscess. START amoxicillin 875 mg twice daily for 10 days.   11-18-2021 Anastasio Auerbach, [facility] ([facility] care). Visit for nasal congestion/cough. START benzonatate 100 mg 3 times daily PRN, Mucinex 600 mg in morning and night, doxycycline 100 mg in morning and night.  Hospital visits:  None in previous 6 months  Medications: Outpatient Encounter Medications as of 03/12/2022  Medication Sig   albuterol (VENTOLIN HFA) 108 (90 Base) MCG/ACT inhaler Inhale 2 puffs into the lungs every 6 (six) hours as needed for wheezing or shortness of breath.   aspirin EC 81 MG tablet Take 81 mg by mouth daily.   atorvastatin (LIPITOR) 40 MG tablet TAKE 1 TABLET BY MOUTH AT BEDTIME   BOOSTRIX 5-2.5-18.5 LF-MCG/0.5 injection    cholecalciferol (VITAMIN D3) 25 MCG (1000 UNIT) tablet Take 1,000 Units by mouth daily.   Continuous Blood Gluc Sensor (FREESTYLE LIBRE 14 DAY SENSOR) MISC USE AS DIRECTED EVERY  14  DAYS   fenofibrate (TRICOR) 145 MG tablet TAKE 1 TABLET BY MOUTH AT BEDTIME   FLUZONE HIGH-DOSE QUADRIVALENT 0.7 ML SUSY    gabapentin (NEURONTIN) 600 MG tablet TAKE ONE TABLET BY MOUTH FOUR TIMES DAILY   Glucagon  (GVOKE HYPOPEN 2-PACK) 1 MG/0.2ML SOAJ Inject 1 Dose into the skin as needed (hypoglycemia).   lidocaine (LIDODERM) 5 % Place 1 patch onto the skin daily. Remove & Discard patch within 12 hours or as directed by MD   lisinopril (ZESTRIL) 2.5 MG tablet Take 1 tablet (2.5 mg total) by mouth daily.   meloxicam (MOBIC) 15 MG tablet Take 1 tablet (15 mg total) by mouth daily.   metFORMIN (GLUCOPHAGE) 1000 MG tablet Take 1 tablet (1,000 mg total) by mouth 2 (two) times daily.   metoprolol succinate (TOPROL-XL) 25 MG 24 hr tablet Take 1 tablet (25 mg total) by mouth daily.   mupirocin ointment (BACTROBAN) 2 % Apply topically 2 (two) times daily.   nitroGLYCERIN (NITROSTAT) 0.4 MG SL tablet Place 1 tablet (0.4 mg total) under the tongue every 5 (five) minutes as needed for chest pain.   omeprazole (PRILOSEC) 40 MG capsule Take 1 capsule (40 mg total) by mouth daily.   ondansetron (ZOFRAN) 4 MG tablet Take 1 tablet (4 mg total) by mouth every 8 (eight) hours as needed for nausea or vomiting.   ONETOUCH VERIO test strip 1 each by Other route daily.   Semaglutide, 1 MG/DOSE, 4 MG/3ML SOPN Inject 1 mg as directed once a week.   SHINGRIX injection    TRESIBA FLEXTOUCH 100 UNIT/ML FlexTouch Pen INJECT 40 UNITS SUBCUTANEOUSLY ONCE DAILY   vitamin C (VITAMIN C) 500 MG tablet Take 1 tablet (500 mg total) by mouth 2 (two) times daily.   zolpidem (AMBIEN) 5 MG tablet Take 1 tablet (  5 mg total) by mouth at bedtime as needed for sleep.   No facility-administered encounter medications on file as of 03/12/2022.  Recent Relevant Labs: Lab Results  Component Value Date/Time   HGBA1C 8.5 (H) 12/25/2021 09:00 AM   HGBA1C 9.1 (H) 04/24/2021 09:12 AM   MICROALBUR 80 06/14/2020 09:41 AM    Kidney Function Lab Results  Component Value Date/Time   CREATININE 0.93 01/27/2022 09:51 AM   CREATININE 0.98 12/25/2021 09:00 AM   GFRNONAA 63 06/26/2020 04:41 PM   GFRAA 72 06/26/2020 04:41 PM    03-12-2022: 1st attempt  left VM 03-14-2022: 2nd attempt left VM 03-17-2022: 3rd attempt left VM  Adherence Review: Is the patient currently on a STATIN medication? Yes Is the patient currently on ACE/ARB medication? Yes Does the patient have >5 day gap between last estimated fill dates? No  Care Gaps: Last eye exam / Retinopathy Screening? 12-25-2021 Last Annual Wellness Visit? None Last Diabetic Foot Exam? None AWV overdue Tdap overdue Colonoscopy overdue Shingrix overdue Covid bboster overdue Flu vaccine overdue  Star Rating Drugs: Ozempic 1 mg- Patient assistance Metformin 1000 mg- Last filled 01-10-2022 60 DS [facility] Atorvastatin 40 mg- Last filled 12-30-2021 90 DS Lisinopril 2.5 mg- Last filled 12-31-2021 90 DS  [place] Clinical Pharmacist Assistant [phone]

## 2022-03-27 ENCOUNTER — Telehealth: Payer: HMO

## 2022-04-08 ENCOUNTER — Ambulatory Visit: Payer: HMO | Admitting: Nurse Practitioner

## 2022-06-06 ENCOUNTER — Other Ambulatory Visit: Payer: Self-pay

## 2022-06-06 DIAGNOSIS — E1142 Type 2 diabetes mellitus with diabetic polyneuropathy: Secondary | ICD-10-CM

## 2022-06-06 MED ORDER — TRESIBA FLEXTOUCH 100 UNIT/ML ~~LOC~~ SOPN: PEN_INJECTOR | SUBCUTANEOUS | 0 refills | Status: DC

## 2022-06-23 ENCOUNTER — Other Ambulatory Visit: Payer: Self-pay

## 2022-06-23 ENCOUNTER — Other Ambulatory Visit: Payer: PPO

## 2022-06-23 DIAGNOSIS — E1165 Type 2 diabetes mellitus with hyperglycemia: Secondary | ICD-10-CM | POA: Diagnosis not present

## 2022-06-23 DIAGNOSIS — I10 Essential (primary) hypertension: Secondary | ICD-10-CM | POA: Diagnosis not present

## 2022-06-23 DIAGNOSIS — E782 Mixed hyperlipidemia: Secondary | ICD-10-CM

## 2022-06-23 DIAGNOSIS — Z794 Long term (current) use of insulin: Secondary | ICD-10-CM | POA: Diagnosis not present

## 2022-06-24 ENCOUNTER — Encounter: Payer: Self-pay | Admitting: Family Medicine

## 2022-06-24 ENCOUNTER — Ambulatory Visit (INDEPENDENT_AMBULATORY_CARE_PROVIDER_SITE_OTHER): Payer: PPO | Admitting: Family Medicine

## 2022-06-24 VITALS — BP 136/58 | HR 78 | Temp 97.5°F | Ht 65.0 in | Wt 187.0 lb

## 2022-06-24 DIAGNOSIS — I119 Hypertensive heart disease without heart failure: Secondary | ICD-10-CM

## 2022-06-24 DIAGNOSIS — E782 Mixed hyperlipidemia: Secondary | ICD-10-CM

## 2022-06-24 DIAGNOSIS — E1165 Type 2 diabetes mellitus with hyperglycemia: Secondary | ICD-10-CM

## 2022-06-24 DIAGNOSIS — Z955 Presence of coronary angioplasty implant and graft: Secondary | ICD-10-CM | POA: Diagnosis not present

## 2022-06-24 DIAGNOSIS — I252 Old myocardial infarction: Secondary | ICD-10-CM | POA: Diagnosis not present

## 2022-06-24 DIAGNOSIS — Z794 Long term (current) use of insulin: Secondary | ICD-10-CM

## 2022-06-24 DIAGNOSIS — E1142 Type 2 diabetes mellitus with diabetic polyneuropathy: Secondary | ICD-10-CM | POA: Diagnosis not present

## 2022-06-24 DIAGNOSIS — R059 Cough, unspecified: Secondary | ICD-10-CM | POA: Diagnosis not present

## 2022-06-24 DIAGNOSIS — Z1211 Encounter for screening for malignant neoplasm of colon: Secondary | ICD-10-CM

## 2022-06-24 DIAGNOSIS — I2510 Atherosclerotic heart disease of native coronary artery without angina pectoris: Secondary | ICD-10-CM

## 2022-06-24 DIAGNOSIS — K2100 Gastro-esophageal reflux disease with esophagitis, without bleeding: Secondary | ICD-10-CM | POA: Diagnosis not present

## 2022-06-24 DIAGNOSIS — J9811 Atelectasis: Secondary | ICD-10-CM | POA: Diagnosis not present

## 2022-06-24 DIAGNOSIS — Z6831 Body mass index (BMI) 31.0-31.9, adult: Secondary | ICD-10-CM

## 2022-06-24 DIAGNOSIS — R0602 Shortness of breath: Secondary | ICD-10-CM | POA: Diagnosis not present

## 2022-06-24 MED ORDER — HUMULIN N KWIKPEN 100 UNIT/ML ~~LOC~~ SUPN: PEN_INJECTOR | SUBCUTANEOUS | 2 refills | Status: DC

## 2022-06-24 MED ORDER — PREGABALIN 75 MG PO CAPS: 75.0000 mg | ORAL_CAPSULE | Freq: Two times a day (BID) | ORAL | 2 refills | Status: DC

## 2022-06-24 NOTE — Patient Instructions (Addendum)
Continue tresiba 40 U at night.  Eat 3 meals per day.   Recommend start preprandial (before meals) insulin Humulin   Check sugars before meals Increase to 4 U prior to breakfast, lunch, and supper.  + 1 u if sugar is greater than 200. + 2 u if sugar between 201-250 + 3 U if sugar between 251-300 + 4 U if sugar between 301-350 + 5 U if sugar between 351-400 + 6 U if sugar > 400.    Start lyrica 75 mg twice daily. If tolerating, but not helping enough call before next refill.   We will schedule you with our diabetes educator and call you with the appt.   Ordering gastrointestinal referral for colon cancer screening.

## 2022-06-24 NOTE — Assessment & Plan Note (Addendum)
Start lyrica 75 mg twice daily. If tolerating, but not helping enough call before next refill.

## 2022-06-24 NOTE — Assessment & Plan Note (Addendum)
Well controlled.  No changes to medicines. lipitor 40 mg daily, fenofibrate 145 mg daily Continue to work on eating a healthy diet and exercise.  Labs drawn today.

## 2022-06-24 NOTE — Assessment & Plan Note (Addendum)
Well controlled.  No medication changes recommended. currently on metoprolol 25 mg daily and lisinopril 2.5 mg daily. Continue healthy diet and exercise.

## 2022-06-24 NOTE — Progress Notes (Signed)
Subjective:  Patient ID: Mitchell Hammond, male    DOB: [birth year]  Age: 67 y.o. MRN: [id number]  Chief Complaint  Patient presents with   Hyperlipidemia   Diabetes   Hypertension    HPI Presents today for chronic follow up. Current cares for his wife who is on dialysis.  Hyperlipidemia: taking lipitor 40 mg daily, fenofibrate 145 mg daily GERD: taking omeprazole 40 mg daily Hypertensive heart disease:: currently on metoprolol 25 mg daily and lisinopril 2.5 mg daily, hx of heart disease and MI (has 2 stents and a balloon). See's Dr. Lennox Pippins.  Denies any history of congestive heart failure. 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 Unable to tolerate ozempic. Reports he has been diabetic approx 20 years. Has taken glipizide and januvia in the past. On lyrica 75 mg twice daily   C/o shob when lying down at night, sleeps propped up on 3 pillows.      06/24/2022    8:53 AM 12/25/2021    8:25 AM 07/24/2020    9:32 AM 07/20/2019    8:12 AM  Depression screen PHQ 2/9  Decreased Interest 0 0 2 0  Down, Depressed, Hopeless 0 0 1 0  PHQ - 2 Score 0 0 3 0  Altered sleeping  3 3   Tired, decreased energy  1 1   Change in appetite  0 0   Feeling bad or failure about yourself   0 0   Trouble concentrating  0 1   Moving slowly or fidgety/restless  0 0   Suicidal thoughts  0 0   PHQ-9 Score  4 8   Difficult doing work/chores  Not difficult at all Somewhat difficult          07/02/2020    6:41 AM 07/24/2020    9:32 AM 12/25/2021    8:25 AM 06/24/2022    8:53 AM  Fall Risk  Falls in the past year?  0 0 0  Was there an injury with Fall?  0 0 0  Fall Risk Category Calculator  0 0 0  Fall Risk Category (Retired)  Low Low   (RETIRED) Patient Fall Risk Level Moderate fall risk  Low fall risk   Patient at Risk for Falls Due to   No Fall Risks No Fall Risks  Fall risk Follow up  Falls evaluation completed  Falls evaluation completed Falls evaluation completed      Review of Systems  Constitutional:  Negative for chills, diaphoresis, fatigue and fever.  HENT:  Negative for congestion, ear pain and sore throat.   Respiratory:  Negative for cough and shortness of breath.   Cardiovascular:  Negative for chest pain and leg swelling.  Gastrointestinal:  Negative for abdominal pain, constipation, diarrhea, nausea and vomiting.  Endocrine: Positive for polyuria. Negative for polydipsia and polyphagia.  Genitourinary:  Negative for dysuria and urgency.  Musculoskeletal:  Negative for arthralgias and myalgias.  Neurological:  Negative for dizziness and headaches.       Paresthesia fingertips and toes.   Psychiatric/Behavioral:  Negative for dysphoric mood.     Current Outpatient Medications on File Prior to Visit  Medication Sig Dispense Refill   albuterol (VENTOLIN HFA) 108 (90 Base) MCG/ACT inhaler Inhale 2 puffs into the lungs every 6 (six) hours as needed for wheezing or shortness of breath. 8 g 3   aspirin EC 81 MG tablet Take 81 mg  by mouth daily.     atorvastatin (LIPITOR) 40 MG tablet TAKE 1 TABLET BY MOUTH AT BEDTIME 90 tablet 1   cholecalciferol (VITAMIN D3) 25 MCG (1000 UNIT) tablet Take 1,000 Units by mouth daily.     Continuous Blood Gluc Sensor (FREESTYLE LIBRE 14 DAY SENSOR) MISC USE AS DIRECTED EVERY  14  DAYS 3 each 6   fenofibrate (TRICOR) 145 MG tablet TAKE 1 TABLET BY MOUTH AT BEDTIME 90 tablet 1   Glucagon (GVOKE HYPOPEN 2-PACK) 1 MG/0.2ML SOAJ Inject 1 Dose into the skin as needed (hypoglycemia). 0.2 mL 1   lidocaine (LIDODERM) 5 % Place 1 patch onto the skin daily. Remove & Discard patch within 12 hours or as directed by MD 30 patch 1   lisinopril (ZESTRIL) 2.5 MG tablet Take 1 tablet (2.5 mg total) by mouth daily. 90 tablet 1   meloxicam (MOBIC) 15 MG tablet Take 1 tablet (15 mg total) by mouth daily. 90 tablet 1   metFORMIN (GLUCOPHAGE) 1000 MG tablet Take 1 tablet (1,000  mg total) by mouth 2 (two) times daily. 60 tablet 0   metoprolol succinate (TOPROL-XL) 25 MG 24 hr tablet Take 1 tablet (25 mg total) by mouth daily. 30 tablet 0   mupirocin ointment (BACTROBAN) 2 % Apply topically 2 (two) times daily. 22 g 6   nitroGLYCERIN (NITROSTAT) 0.4 MG SL tablet Place 1 tablet (0.4 mg total) under the tongue every 5 (five) minutes as needed for chest pain. 25 tablet 11   omeprazole (PRILOSEC) 40 MG capsule Take 1 capsule (40 mg total) by mouth daily. 30 capsule 3   ondansetron (ZOFRAN) 4 MG tablet Take 1 tablet (4 mg total) by mouth every 8 (eight) hours as needed for nausea or vomiting. 20 tablet 0   ONETOUCH VERIO test strip 1 each by Other route daily. 100 each 2   SHINGRIX injection      TRESIBA FLEXTOUCH 100 UNIT/ML FlexTouch Pen INJECT 40 UNITS SUBCUTANEOUSLY ONCE DAILY 15 mL 0   vitamin C (VITAMIN C) 500 MG tablet Take 1 tablet (500 mg total) by mouth 2 (two) times daily. 60 tablet 2   zolpidem (AMBIEN) 5 MG tablet Take 1 tablet (5 mg total) by mouth at bedtime as needed for sleep. 30 tablet 3   No current facility-administered medications on file prior to visit.   Past Medical History:  Diagnosis Date   Angina pectoris ([place]) 12/26/2015   CAD (coronary artery disease)    Chip fracture of triquetral bone of right wrist, closed, initial encounter 12/09/2016   Chronic back pain    Contracture of tendon sheath 08/20/2016   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 07/05/2019   Diabetes mellitus due to underlying condition with unspecified complications ([place]) [id number]   Diabetic polyneuropathy associated with type 2 diabetes mellitus ([place]) 08/20/2016   Discitis    Essential hypertension 12/26/2015   Fracture, tibial plateau 12/18/2014   Hypertension    IDDM (insulin dependent diabetes mellitus)    type 2   Metatarsalgia of both feet 08/20/2016   MI (myocardial infarction) ([place])    x 2, Multi-link Vision 3.5 x 15 mm BMS in 2009 (mid LAD), balloon 2015   Mixed hyperlipidemia  06/14/2020   Obesity (BMI 30.0-34.9) 06/26/2020   Tibial plateau fracture 12/18/2014   Vitamin D insufficiency    Past Surgical History:  Procedure Laterality Date   BACK SURGERY     2014   CARDIAC CATHETERIZATION     x 2,  2009 & 2015   COLONOSCOPY     CORONARY ANGIOPLASTY     stent - 2008, balloon - 2015   FASCIOTOMY Right 12/19/2014   Procedure: ANTERIOR COMPARTMENTAL FASCIOTOMY;  Surgeon: Altamese Highmore, MD;  Location: [facility];  Service: Orthopedics;  Laterality: Right;   HAND SURGERY Left    HARDWARE REMOVAL Right 07/24/2015   Procedure: HARDWARE REMOVAL RIGHT TIBIA;  Surgeon: Altamese Eden, MD;  Location: [facility];  Service: Orthopedics;  Laterality: Right;   KNEE ARTHROSCOPY Right 07/24/2015   Procedure: RIGHT ARTHROSCOPY KNEE WITH PARTIAL SYNOVECTOMY AND MEDIAL MENISCECTOMY;  Surgeon: Altamese Allen, MD;  Location: [facility];  Service: Orthopedics;  Laterality: Right;   LEFT HEART CATH AND CORONARY ANGIOGRAPHY N/A 07/02/2020   Procedure: LEFT HEART CATH AND CORONARY ANGIOGRAPHY;  Surgeon: [name], MD;  Location: [facility];  Service: Cardiovascular;  Laterality: N/A;   LEFT HEART CATH AND CORONARY ANGIOGRAPHY N/A 01/22/2022   Procedure: LEFT HEART CATH AND CORONARY ANGIOGRAPHY;  Surgeon: Wellington Hampshire, MD;  Location: [facility];  Service: Cardiovascular;  Laterality: N/A;   ORIF TIBIA PLATEAU Right 12/19/2014   Procedure: OPEN REDUCTION INTERNAL FIXATION (ORIF) RIGHT TIBIAL PLATEAU;  Surgeon: Altamese [place], MD;  Location: [facility];  Service: Orthopedics;  Laterality: Right;   TONSILLECTOMY     VASECTOMY      Family History  Problem Relation Age of Onset   Diabetes Mother    CAD Mother    Liver disease Father    Social History   Socioeconomic History   Marital status: Married    Spouse name: Not on file   Number of children: 2   Years of education: Not on file   Highest education level: Not on file  Occupational History   Occupation: disabled  Tobacco Use   Smoking  status: Former    Packs/day: 1.00    Types: Cigarettes    Quit date: 02/20/2007    Years since quitting: 15.3   Smokeless tobacco: Never  Vaping Use   Vaping Use: Never used  Substance and Sexual Activity   Alcohol use: No    Alcohol/week: 0.0 standard drinks of alcohol    Comment: None in years   Drug use: No   Sexual activity: Yes    Partners: Female  Other Topics Concern   Not on file  Social History Narrative   Pt lives with wife and daughter in [place].    Social Determinants of Health   Financial Resource Strain: Low Risk  (11/14/2021)   Overall Financial Resource Strain (CARDIA)    Difficulty of Paying Living Expenses: Not hard at all  Food Insecurity: No Food Insecurity (08/29/2020)   Hunger Vital Sign    Worried About Running Out of Food in the Last Year: Never true    Ran Out of Food in the Last Year: Never true  Transportation Needs: No Transportation Needs (11/14/2021)   PRAPARE - Hydrologist (Medical): No    Lack of Transportation (Non-Medical): No  Physical Activity: Inactive (06/24/2022)   Exercise Vital Sign    Days of Exercise per Week: 0 days    Minutes of Exercise per Session: 0 min  Stress: No Stress Concern Present (06/24/2022)   [place]    Feeling of Stress : Not at all  Social Connections: Moderately Integrated (06/24/2022)   Social Connection and Isolation Panel [NHANES]    Frequency of Communication  with Friends and Family: More than three times a week    Frequency of Social Gatherings with Friends and Family: More than three times a week    Attends Religious Services: More than 4 times per year    Active Member of [employer] or Organizations: No    Attends Music therapist: Never    Marital Status: Married    Objective:  BP (!) 136/58   Pulse 78   Temp (!) 97.5 F (36.4 C)   Ht 5' 5"$  (1.651 m)   Wt 187 lb (84.8 kg)   SpO2 100%   BMI 31.12  kg/m      06/24/2022    8:58 AM 02/04/2022    3:19 PM 01/27/2022    9:14 AM  BP/Weight  Systolic BP [id number] [id number]  Diastolic BP 58 64 72  Wt. (Lbs) 187 181 183.4  BMI 31.12 kg/m2 30.12 kg/m2 30.52 kg/m2    Physical Exam Vitals reviewed.  Constitutional:      Appearance: Normal appearance. He is normal weight.  Cardiovascular:     Rate and Rhythm: Normal rate and regular rhythm.     Heart sounds: No murmur heard. Pulmonary:     Effort: Pulmonary effort is normal.     Breath sounds: Normal breath sounds.  Abdominal:     General: Abdomen is flat. Bowel sounds are normal.     Palpations: Abdomen is soft.     Tenderness: There is no abdominal tenderness.  Neurological:     Mental Status: He is alert and oriented to person, place, and time.  Psychiatric:        Mood and Affect: Mood normal.        Behavior: Behavior normal.     Diabetic Foot Exam - Simple   Simple Foot Form Diabetic Foot exam was performed with the following findings: Yes 06/24/2022  9:24 AM  Visual Inspection See comments: Yes Sensation Testing See comments: Yes Pulse Check Posterior Tibialis and Dorsalis pulse intact bilaterally: Yes Comments Decree sensation bilaterally.  Calluses.      Lab Results  Component Value Date   WBC 5.5 06/23/2022   HGB 14.3 06/23/2022   HCT 43.6 06/23/2022   PLT 171 06/23/2022   GLUCOSE 197 (H) 06/23/2022   CHOL 119 06/23/2022   TRIG 55 06/23/2022   HDL 39 (L) 06/23/2022   LDLCALC 68 06/23/2022   ALT 26 06/23/2022   AST 22 06/23/2022   NA 142 06/23/2022   K 4.2 06/23/2022   CL 107 (H) 06/23/2022   CREATININE 1.00 06/23/2022   BUN 14 06/23/2022   CO2 21 06/23/2022   TSH 2.829 12/18/2014   INR 1.20 12/18/2014   HGBA1C 8.8 (H) 06/23/2022   MICROALBUR 80 06/14/2020      Assessment & Plan:    Hypertensive heart disease without heart failure Assessment & Plan: Well controlled.  No medication changes recommended. currently on metoprolol 25 mg daily and  lisinopril 2.5 mg daily. Continue healthy diet and exercise.     Gastroesophageal reflux disease with esophagitis without hemorrhage Assessment & Plan: Continue with Omeprazole 40 mg daily.   Type 2 diabetes mellitus with hyperglycemia, with long-term current use of insulin [facility]) Assessment & Plan: Continue tresiba 40 U at night.  Eat 3 meals per day.  Continue lyrica 75 mg twice daily.   Recommend start preprandial (before meals) insulin Humulin   Check sugars before meals Increase to 4 U prior to breakfast, lunch, and supper.  + 1  u if sugar is greater than 200. + 2 u if sugar between 201-250 + 3 U if sugar between 251-300 + 4 U if sugar between 301-350 + 5 U if sugar between 351-400 + 6 U if sugar > 400.    Orders: -     HumuLIN N KwikPen; Check sugars before meals Start 4 U prior to breakfast, lunch, and supper. + 1 u if sugar is greater than 200. + 2 u if sugar between 201-250 + 3 U if sugar between 251-300 + 4 U if sugar between 301-350 + 5 U if sugar between 351-400 + 6 U if sugar > 400.  Dispense: 9 mL; Refill: 2  Mixed hyperlipidemia Assessment & Plan: Well controlled.  No changes to medicines. lipitor 40 mg daily, fenofibrate 145 mg daily Continue to work on eating a healthy diet and exercise.  Labs drawn today.     Screening for colon cancer -     Cologuard  Diabetic polyneuropathy associated with type 2 diabetes mellitus ([place])  Shortness of breath Assessment & Plan: Ordered Chest Xray.  Orders: -     DG Chest 2 View  Coronary artery disease involving native coronary artery of native heart without angina pectoris  BMI 31.0-31.9,adult Assessment & Plan: Recommend continue to work on eating healthy diet and exercise.    Other orders -     Pregabalin; Take 1 capsule (75 mg total) by mouth 2 (two) times daily.  Dispense: 60 capsule; Refill: 2     Meds ordered this encounter  Medications   pregabalin (LYRICA) 75 MG capsule    Sig: Take 1 capsule  (75 mg total) by mouth 2 (two) times daily.    Dispense:  60 capsule    Refill:  2   Insulin NPH, Human,, Isophane, (HUMULIN N KWIKPEN) 100 UNIT/ML Kiwkpen    Sig: Check sugars before meals Start 4 U prior to breakfast, lunch, and supper. + 1 u if sugar is greater than 200. + 2 u if sugar between 201-250 + 3 U if sugar between 251-300 + 4 U if sugar between 301-350 + 5 U if sugar between 351-400 + 6 U if sugar > 400.    Dispense:  9 mL    Refill:  2    Orders Placed This Encounter  Procedures   DG Chest 2 View   Cologuard    .Total time spent on today's visit was greater than 40 minutes, including both face-to-face time and nonface-to-face time personally spent on review of chart (labs and imaging), discussing labs and goals, discussing further work-up, treatment options, referrals to specialist if needed, reviewing outside records of pertinent, answering patient's questions, and coordinating care.  Follow-up: Return in about 3 months (around 09/22/2022) for CHronic.  An After Visit Summary was printed and given to the patient.   Geralynn Ochs I Leal-Borjas,acting as a scribe for Rochel Brome, MD.,have documented all relevant documentation on the behalf of Rochel Brome, MD,as directed by  Rochel Brome, MD while in the presence of Rochel Brome, MD.    I attest that I have reviewed this visit and agree with the plan scribed by my staff.   Rochel Brome, MD [facility] [phone]

## 2022-06-26 LAB — COMPREHENSIVE METABOLIC PANEL
ALT: 26 IU/L (ref 0–44)
AST: 22 IU/L (ref 0–40)
Albumin/Globulin Ratio: 2.1 (ref 1.2–2.2)
Albumin: 4.2 g/dL (ref 3.9–4.9)
Alkaline Phosphatase: 72 IU/L (ref 44–121)
BUN/Creatinine Ratio: 14 (ref 10–24)
BUN: 14 mg/dL (ref 8–27)
Bilirubin Total: 0.4 mg/dL (ref 0.0–1.2)
CO2: 21 mmol/L (ref 20–29)
Calcium: 9.3 mg/dL (ref 8.6–10.2)
Chloride: 107 mmol/L — ABNORMAL HIGH (ref 96–106)
Creatinine, Ser: 1 mg/dL (ref 0.76–1.27)
Globulin, Total: 2 g/dL (ref 1.5–4.5)
Glucose: 197 mg/dL — ABNORMAL HIGH (ref 70–99)
Potassium: 4.2 mmol/L (ref 3.5–5.2)
Sodium: 142 mmol/L (ref 134–144)
Total Protein: 6.2 g/dL (ref 6.0–8.5)
eGFR: 82 mL/min/{1.73_m2} (ref >59)

## 2022-06-26 LAB — HEMOGLOBIN A1C
Est. average glucose Bld gHb Est-mCnc: 206 mg/dL
Hgb A1c MFr Bld: 8.8 % — ABNORMAL HIGH (ref 4.8–5.6)

## 2022-06-26 LAB — CBC WITH DIFFERENTIAL/PLATELET
Basophils Absolute: 0 10*3/uL (ref 0.0–0.2)
Basos: 1 %
EOS (ABSOLUTE): 0.1 10*3/uL (ref 0.0–0.4)
Eos: 2 %
Hematocrit: 43.6 % (ref 37.5–51.0)
Hemoglobin: 14.3 g/dL (ref 13.0–17.7)
Immature Grans (Abs): 0 10*3/uL (ref 0.0–0.1)
Immature Granulocytes: 0 %
Lymphocytes Absolute: 1.2 10*3/uL (ref 0.7–3.1)
Lymphs: 22 %
MCH: 28.3 pg (ref 26.6–33.0)
MCHC: 32.8 g/dL (ref 31.5–35.7)
MCV: 86 fL (ref 79–97)
Monocytes Absolute: 0.4 10*3/uL (ref 0.1–0.9)
Monocytes: 8 %
Neutrophils Absolute: 3.7 10*3/uL (ref 1.4–7.0)
Neutrophils: 67 %
Platelets: 171 10*3/uL (ref 150–450)
RBC: 5.05 x10E6/uL (ref 4.14–5.80)
RDW: 13 % (ref 11.6–15.4)
WBC: 5.5 10*3/uL (ref 3.4–10.8)

## 2022-06-26 LAB — MICROALBUMIN / CREATININE URINE RATIO
Creatinine, Urine: 83.6 mg/dL
Microalb/Creat Ratio: 76 mg/g creat — ABNORMAL HIGH (ref 0–29)
Microalbumin, Urine: 63.5 ug/mL

## 2022-06-26 LAB — CARDIOVASCULAR RISK ASSESSMENT

## 2022-06-26 LAB — LIPID PANEL
Chol/HDL Ratio: 3.1 ratio (ref 0.0–5.0)
Cholesterol, Total: 119 mg/dL (ref 100–199)
HDL: 39 mg/dL — ABNORMAL LOW (ref >39)
LDL Chol Calc (NIH): 68 mg/dL (ref 0–99)
Triglycerides: 55 mg/dL (ref 0–149)
VLDL Cholesterol Cal: 12 mg/dL (ref 5–40)

## 2022-06-28 DIAGNOSIS — Z1211 Encounter for screening for malignant neoplasm of colon: Secondary | ICD-10-CM | POA: Insufficient documentation

## 2022-06-28 DIAGNOSIS — R0602 Shortness of breath: Secondary | ICD-10-CM | POA: Insufficient documentation

## 2022-06-28 DIAGNOSIS — E1165 Type 2 diabetes mellitus with hyperglycemia: Secondary | ICD-10-CM | POA: Insufficient documentation

## 2022-06-28 NOTE — Assessment & Plan Note (Signed)
Continue with Omeprazole 40 mg daily.

## 2022-06-28 NOTE — Assessment & Plan Note (Signed)
Recommend continue to work on eating healthy diet and exercise.  

## 2022-06-28 NOTE — Assessment & Plan Note (Signed)
Ordered Chest X-ray.

## 2022-06-28 NOTE — Assessment & Plan Note (Addendum)
Continue tresiba 40 U at night.  Eat 3 meals per day.  Continue lyrica 75 mg twice daily.   Recommend start preprandial (before meals) insulin Humulin   Check sugars before meals Increase to 4 U prior to breakfast, lunch, and supper.  + 1 u if sugar is greater than 200. + 2 u if sugar between 201-250 + 3 U if sugar between 251-300 + 4 U if sugar between 301-350 + 5 U if sugar between 351-400 + 6 U if sugar > 400.

## 2022-07-01 ENCOUNTER — Other Ambulatory Visit: Payer: Self-pay

## 2022-07-01 MED ORDER — EMPAGLIFLOZIN 10 MG PO TABS: 10.0000 mg | ORAL_TABLET | Freq: Every day | ORAL | 2 refills | Status: DC

## 2022-07-02 ENCOUNTER — Other Ambulatory Visit: Payer: Self-pay | Admitting: Family Medicine

## 2022-07-02 ENCOUNTER — Ambulatory Visit: Payer: HMO | Admitting: Physician Assistant

## 2022-07-02 ENCOUNTER — Other Ambulatory Visit: Payer: Self-pay

## 2022-07-02 DIAGNOSIS — R0602 Shortness of breath: Secondary | ICD-10-CM

## 2022-07-06 ENCOUNTER — Other Ambulatory Visit: Payer: Self-pay

## 2022-07-06 DIAGNOSIS — E785 Hyperlipidemia, unspecified: Secondary | ICD-10-CM

## 2022-07-06 MED ORDER — ATORVASTATIN CALCIUM 40 MG PO TABS: 40.0000 mg | ORAL_TABLET | Freq: Every day | ORAL | 1 refills | Status: DC

## 2022-07-10 ENCOUNTER — Other Ambulatory Visit: Payer: Self-pay | Admitting: Family Medicine

## 2022-07-10 DIAGNOSIS — E1142 Type 2 diabetes mellitus with diabetic polyneuropathy: Secondary | ICD-10-CM

## 2022-07-14 DIAGNOSIS — Z1211 Encounter for screening for malignant neoplasm of colon: Secondary | ICD-10-CM | POA: Diagnosis not present

## 2022-07-16 ENCOUNTER — Ambulatory Visit: Payer: PPO

## 2022-07-17 ENCOUNTER — Ambulatory Visit: Payer: PPO

## 2022-07-17 ENCOUNTER — Ambulatory Visit: Payer: PPO | Attending: Family Medicine

## 2022-07-23 LAB — COLOGUARD: COLOGUARD: POSITIVE — AB

## 2022-07-24 ENCOUNTER — Other Ambulatory Visit: Payer: Self-pay

## 2022-07-24 DIAGNOSIS — Z1211 Encounter for screening for malignant neoplasm of colon: Secondary | ICD-10-CM

## 2022-07-25 ENCOUNTER — Ambulatory Visit: Payer: PPO | Attending: Family Medicine

## 2022-07-25 DIAGNOSIS — R0602 Shortness of breath: Secondary | ICD-10-CM

## 2022-07-25 LAB — ECHOCARDIOGRAM COMPLETE
Area-P 1/2: 2.64 cm2
S' Lateral: 3 cm

## 2022-08-13 ENCOUNTER — Other Ambulatory Visit: Payer: Self-pay | Admitting: Family Medicine

## 2022-08-13 DIAGNOSIS — E1142 Type 2 diabetes mellitus with diabetic polyneuropathy: Secondary | ICD-10-CM

## 2022-08-27 ENCOUNTER — Telehealth: Payer: Self-pay

## 2022-08-27 NOTE — Telephone Encounter (Signed)
I left a message on the number(s) listed in the patients chart requesting the patient to call back regarding the upcomming appointment for 09/26/2022. The provider is out of the office that day. The appointment has been canceled. Waiting for the patient to return the call. 

## 2022-08-28 ENCOUNTER — Other Ambulatory Visit: Payer: Self-pay

## 2022-08-28 MED ORDER — FENOFIBRATE 145 MG PO TABS: 145.0000 mg | ORAL_TABLET | Freq: Every day | ORAL | 1 refills | Status: DC

## 2022-09-01 DIAGNOSIS — M545 Low back pain, unspecified: Secondary | ICD-10-CM | POA: Diagnosis not present

## 2022-09-11 ENCOUNTER — Other Ambulatory Visit: Payer: Self-pay

## 2022-09-11 DIAGNOSIS — E1165 Type 2 diabetes mellitus with hyperglycemia: Secondary | ICD-10-CM

## 2022-09-11 MED ORDER — LISINOPRIL 2.5 MG PO TABS: 2.5000 mg | ORAL_TABLET | Freq: Every day | ORAL | 1 refills | Status: DC

## 2022-09-19 ENCOUNTER — Other Ambulatory Visit: Payer: Self-pay | Admitting: Family Medicine

## 2022-09-22 ENCOUNTER — Other Ambulatory Visit: Payer: Self-pay

## 2022-09-22 MED ORDER — FREESTYLE LIBRE 14 DAY SENSOR MISC: 6 refills | Status: DC

## 2022-09-26 ENCOUNTER — Other Ambulatory Visit: Payer: Self-pay | Admitting: Family Medicine

## 2022-09-26 ENCOUNTER — Ambulatory Visit: Payer: PPO | Admitting: Family Medicine

## 2022-09-26 DIAGNOSIS — E1142 Type 2 diabetes mellitus with diabetic polyneuropathy: Secondary | ICD-10-CM

## 2022-10-31 ENCOUNTER — Other Ambulatory Visit: Payer: Self-pay | Admitting: Physician Assistant

## 2022-10-31 DIAGNOSIS — E1165 Type 2 diabetes mellitus with hyperglycemia: Secondary | ICD-10-CM

## 2022-10-31 DIAGNOSIS — E1142 Type 2 diabetes mellitus with diabetic polyneuropathy: Secondary | ICD-10-CM

## 2022-10-31 MED ORDER — TRESIBA FLEXTOUCH 100 UNIT/ML ~~LOC~~ SOPN: PEN_INJECTOR | SUBCUTANEOUS | 0 refills | Status: DC

## 2022-11-09 NOTE — Assessment & Plan Note (Signed)
Continue tresiba 40 U at night.  Eat 3 meals per day.  Continue lyrica 75 mg twice daily.   Recommend start preprandial (before meals) insulin Humulin   Check sugars before meals Increase to 4 U prior to breakfast, lunch, and supper.  + 1 u if sugar is greater than 200. + 2 u if sugar between 201-250 + 3 U if sugar between 251-300 + 4 U if sugar between 301-350 + 5 U if sugar between 351-400 + 6 U if sugar > 400.   

## 2022-11-09 NOTE — Assessment & Plan Note (Signed)
Well controlled.  No changes to medicines. lipitor 40 mg daily, fenofibrate 145 mg daily Continue to work on eating a healthy diet and exercise.  Labs drawn today.   

## 2022-11-09 NOTE — Assessment & Plan Note (Signed)
Continue with Omeprazole 40 mg daily. 

## 2022-11-09 NOTE — Progress Notes (Signed)
Subjective:  Patient ID: Mitchell Hammond, male    DOB: 1955-10-09  Age: 67 y.o. MRN: [id number]  Chief Complaint  Patient presents with   Medical Management of Chronic Issues    HPI   Hyperlipidemia: taking lipitor 40 mg daily, fenofibrate 145 mg daily  GERD: taking omeprazole 40 mg daily  Hypertensive heart disease:: currently on metoprolol 25 mg daily and lisinopril 2.5 mg daily, hx of heart disease and MI (has 2 stents and a balloon). See's Dr. Josiah Lobo.  Denies any history of congestive heart failure.  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 Unable to tolerate ozempic. Reports he has been diabetic approx 20 years. Has taken glipizide and januvia in the past. On lyrica 75 mg twice daily      11/10/2022   10:10 AM 06/24/2022    8:53 AM 12/25/2021    8:25 AM 07/24/2020    9:32 AM 07/20/2019    8:12 AM  Depression screen PHQ 2/9  Decreased Interest 0 0 0 2 0  Down, Depressed, Hopeless 0 0 0 1 0  PHQ - 2 Score 0 0 0 3 0  Altered sleeping 0  3 3   Tired, decreased energy 0  1 1   Change in appetite 0  0 0   Feeling bad or failure about yourself  0  0 0   Trouble concentrating 0  0 1   Moving slowly or fidgety/restless 0  0 0   Suicidal thoughts 0  0 0   PHQ-9 Score 0  4 8   Difficult doing work/chores Not difficult at all  Not difficult at all Somewhat difficult         11/10/2022   10:11 AM  Fall Risk   Falls in the past year? 0  Number falls in past yr: 0  Injury with Fall? 0  Risk for fall due to : No Fall Risks  Follow up Falls evaluation completed;Falls prevention discussed    Patient Care Team: Blane Ohara, MD as PCP - General (Family Medicine) Revankar, Aundra Dubin, MD as PCP - Cardiology (Cardiology) Zettie Pho, [facility] (Inactive) (Pharmacist)   Review of Systems  Constitutional:  Negative for chills and fever.  HENT:   Negative for congestion, rhinorrhea and sore throat.   Respiratory:  Negative for cough and shortness of breath.   Cardiovascular:  Negative for chest pain and palpitations.  Gastrointestinal:  Negative for abdominal pain, constipation, diarrhea, nausea and vomiting.  Genitourinary:  Negative for dysuria and urgency.  Musculoskeletal:  Negative for arthralgias, back pain and myalgias.  Neurological:  Negative for dizziness and headaches.  Psychiatric/Behavioral:  Negative for dysphoric mood. The patient is not nervous/anxious.     Current Outpatient Medications on File Prior to Visit  Medication Sig Dispense Refill   albuterol (VENTOLIN HFA) 108 (90 Base) MCG/ACT inhaler Inhale 2 puffs into the lungs every 6 (six) hours as needed for wheezing or shortness of breath. 8 g 3   aspirin EC 81 MG tablet Take 81 mg by mouth daily.     atorvastatin (LIPITOR) 40 MG tablet Take 1 tablet (40 mg total) by mouth at bedtime. 90 tablet 1   Continuous Glucose Sensor (FREESTYLE LIBRE 14 DAY SENSOR) MISC USE AS DIRECTED EVERY  14  DAYS 3 each 6   fenofibrate (TRICOR) 145 MG tablet Take 1 tablet (145  mg total) by mouth at bedtime. 90 tablet 1   Glucagon (GVOKE HYPOPEN 2-PACK) 1 MG/0.2ML SOAJ Inject 1 Dose into the skin as needed (hypoglycemia). 0.2 mL 1   lisinopril (ZESTRIL) 2.5 MG tablet Take 1 tablet (2.5 mg total) by mouth daily. 90 tablet 1   metFORMIN (GLUCOPHAGE) 1000 MG tablet Take 1 tablet (1,000 mg total) by mouth 2 (two) times daily. 60 tablet 0   mupirocin ointment (BACTROBAN) 2 % Apply topically 2 (two) times daily. 22 g 6   nitroGLYCERIN (NITROSTAT) 0.4 MG SL tablet Place 1 tablet (0.4 mg total) under the tongue every 5 (five) minutes as needed for chest pain. 25 tablet 11   omeprazole (PRILOSEC) 40 MG capsule Take 1 capsule (40 mg total) by mouth daily. 30 capsule 3   ondansetron (ZOFRAN) 4 MG tablet Take 1 tablet (4 mg total) by mouth every 8 (eight) hours as needed for nausea or vomiting. 20  tablet 0   ONETOUCH VERIO test strip 1 each by Other route daily. 100 each 2   pregabalin (LYRICA) 75 MG capsule Take 1 capsule (75 mg total) by mouth 2 (two) times daily. 60 capsule 2   SHINGRIX injection      vitamin C (VITAMIN C) 500 MG tablet Take 1 tablet (500 mg total) by mouth 2 (two) times daily. 60 tablet 2   No current facility-administered medications on file prior to visit.   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   Diabetes mellitus due to underlying condition with unspecified complications (HCC) 12/26/2015   Diabetic polyneuropathy associated with type 2 diabetes mellitus (HCC) 08/20/2016   Discitis    Essential hypertension 12/26/2015   Fracture, tibial plateau 12/18/2014   Hypertension    IDDM (insulin dependent diabetes mellitus)    type 2   Metatarsalgia of both feet 08/20/2016   MI (myocardial infarction) (HCC)    x 2, Multi-link Vision 3.5 x 15 mm BMS in 2009 (mid LAD), balloon 2015   Mixed hyperlipidemia 06/14/2020   Obesity (BMI 30.0-34.9) 06/26/2020   Tibial plateau fracture 12/18/2014   Vitamin D insufficiency    Past Surgical History:  Procedure Laterality Date   BACK SURGERY     2014   CARDIAC CATHETERIZATION     x 2, 2009 & 2015   COLONOSCOPY     CORONARY ANGIOPLASTY     stent - 2008, balloon - 2015   FASCIOTOMY Right 12/19/2014   Procedure: ANTERIOR COMPARTMENTAL FASCIOTOMY;  Surgeon: Myrene Galas, MD;  Location: [facility] OR;  Service: Orthopedics;  Laterality: Right;   HAND SURGERY Left    HARDWARE REMOVAL Right 07/24/2015   Procedure: HARDWARE REMOVAL RIGHT TIBIA;  Surgeon: Myrene Galas, MD;  Location: [facility] OR;  Service: Orthopedics;  Laterality: Right;   KNEE ARTHROSCOPY Right 07/24/2015   Procedure: RIGHT  ARTHROSCOPY KNEE WITH PARTIAL SYNOVECTOMY AND MEDIAL MENISCECTOMY;  Surgeon: Myrene Galas, MD;  Location: [facility] OR;  Service: Orthopedics;  Laterality: Right;   LEFT HEART CATH AND CORONARY ANGIOGRAPHY N/A 07/02/2020   Procedure: LEFT HEART CATH AND CORONARY ANGIOGRAPHY;  Surgeon: Lennette Bihari, MD;  Location: [facility] INVASIVE CV LAB;  Service: Cardiovascular;  Laterality: N/A;   LEFT HEART CATH AND CORONARY  ANGIOGRAPHY N/A 01/22/2022   Procedure: LEFT HEART CATH AND CORONARY ANGIOGRAPHY;  Surgeon: Iran Ouch, MD;  Location: [facility] INVASIVE CV LAB;  Service: Cardiovascular;  Laterality: N/A;   ORIF TIBIA PLATEAU Right 12/19/2014   Procedure: OPEN REDUCTION INTERNAL FIXATION (ORIF) RIGHT TIBIAL PLATEAU;  Surgeon: Myrene Galas, MD;  Location: [facility] OR;  Service: Orthopedics;  Laterality: Right;   TONSILLECTOMY     VASECTOMY      Family History  Problem Relation Age of Onset   Diabetes Mother    CAD Mother    Liver disease Father    Social History   Socioeconomic History   Marital status: Married    Spouse name: Not on file   Number of children: 2   Years of education: Not on file   Highest education level: Not on file  Occupational History   Occupation: disabled  Tobacco Use   Smoking status: Former    Packs/day: 1    Types: Cigarettes    Quit date: 02/20/2007    Years since quitting: 15.7   Smokeless tobacco: Never  Vaping Use   Vaping Use: Never used  Substance and Sexual Activity   Alcohol use: No    Alcohol/week: 0.0 standard drinks of alcohol    Comment: None in years   Drug use: No   Sexual activity: Yes    Partners: Female  Other Topics Concern   Not on file  Social History Narrative   Pt lives with wife and daughter in [place].    Social Determinants of Health   Financial Resource Strain: Low Risk  (11/14/2021)   Overall Financial Resource Strain (CARDIA)    Difficulty of Paying Living Expenses: Not hard at all  Food Insecurity: No Food Insecurity (08/29/2020)   Hunger  Vital Sign    Worried About Running Out of Food in the Last Year: Never true    Ran Out of Food in the Last Year: Never true  Transportation Needs: No Transportation Needs (11/14/2021)   PRAPARE - Administrator, Civil Service (Medical): No    Lack of Transportation (Non-Medical): No  Physical Activity: Inactive (06/24/2022)   Exercise Vital Sign    Days of Exercise per Week: 0 days    Minutes of Exercise per Session: 0 min  Stress: No Stress Concern Present (06/24/2022)   [employer] of Occupational Health - Occupational Stress Questionnaire    Feeling of Stress : Not at all  Social Connections: Moderately Integrated (06/24/2022)   Social Connection and Isolation Panel [NHANES]    Frequency of Communication with Friends and Family: More than three times a week    Frequency of Social Gatherings with Friends and Family: More than three times a week    Attends Religious Services: More than 4 times per year    Active Member of [employer] or Organizations: No    Attends Banker Meetings: Never    Marital Status: Married    Objective:  BP 116/78   Pulse 78   Temp (!) 97.3 F (36.3 C)   Resp 18   Ht 5\' 5"  (1.651 m)   Wt 187 lb (84.8 kg)   BMI 31.12 kg/m      11/10/2022    9:51 AM 06/24/2022    8:58 AM 02/04/2022    3:19 PM  BP/Weight  Systolic BP 116 136 118  Diastolic BP 78 58 64  Wt. (Lbs) 187 187 181  BMI 31.12 kg/m2 31.12 kg/m2 30.12 kg/m2  Physical Exam Vitals reviewed.  Constitutional:      Appearance: He is normal weight.  Neck:     Vascular: No carotid bruit.  Cardiovascular:     Rate and Rhythm: Normal rate and regular rhythm.  Pulmonary:     Effort: Pulmonary effort is normal. No respiratory distress.     Breath sounds: Normal breath sounds.  Abdominal:     General: Bowel sounds are normal.     Palpations: Abdomen is soft.     Tenderness: There is no abdominal tenderness.  Neurological:     Mental Status: He is alert and oriented to  person, place, and time.  Psychiatric:        Mood and Affect: Mood normal.        Behavior: Behavior normal.     Diabetic Foot Exam - Simple   Simple Foot Form Diabetic Foot exam was performed with the following findings: Yes 11/10/2022 10:10 AM  Visual Inspection No deformities, no ulcerations, no other skin breakdown bilaterally: Yes Sensation Testing Intact to touch and monofilament testing bilaterally: Yes Pulse Check Posterior Tibialis and Dorsalis pulse intact bilaterally: Yes Comments      Lab Results  Component Value Date   WBC 5.5 11/10/2022   HGB 16.1 11/10/2022   HCT 48.1 11/10/2022   PLT 167 11/10/2022   GLUCOSE 144 (H) 11/10/2022   CHOL 148 11/10/2022   TRIG 118 11/10/2022   HDL 40 11/10/2022   LDLCALC 87 11/10/2022   ALT 25 11/10/2022   AST 27 11/10/2022   NA 140 11/10/2022   K 4.8 11/10/2022   CL 102 11/10/2022   CREATININE 1.09 11/10/2022   BUN 16 11/10/2022   CO2 22 11/10/2022   TSH 2.829 12/18/2014   INR 1.20 12/18/2014   HGBA1C 9.8 (H) 11/10/2022   MICROALBUR 80 06/14/2020      Assessment & Plan:    Hypertensive heart disease without heart failure Assessment & Plan: Well controlled.  No medication changes recommended. currently on metoprolol 25 mg daily and lisinopril 2.5 mg daily. Continue healthy diet and exercise.    Orders: -     CBC with Differential/Platelet -     Comprehensive metabolic panel -     Metoprolol Succinate ER; Take 1 tablet (25 mg total) by mouth daily.  Dispense: 90 tablet; Refill: 1  Gastroesophageal reflux disease with esophagitis without hemorrhage Assessment & Plan: Continue with Omeprazole 40 mg daily.   Type 2 diabetes mellitus with hyperglycemia, with long-term current use of insulin (HCC) Assessment & Plan: Control: Uncotrolled Recommend check sugars fasting daily. Recommend check feet daily. Recommend annual eye exams. Medicines:  Metformin 1000 mg twice daily  and tresiba 30 units daily, Humulin  sliding scale Continue to work on eating a healthy diet and exercise.  Labs drawn today.     Orders: -     Hemoglobin A1c -     HumaLOG KwikPen; Check sugars before meals Start 4 U prior to breakfast, lunch, and supper. + 1 u if sugar is greater than 200. + 2 u if sugar between 201-250 + 3 U if sugar between 251-300 + 4 U if sugar between 301-350 + 5 U if sugar between 351-400 + 6 U if sugar > 400.  Dispense: 15 mL; Refill: 1  Mixed hyperlipidemia Assessment & Plan: Well controlled.  No changes to medicines. lipitor 40 mg daily, fenofibrate 145 mg daily Continue to work on eating a healthy diet and exercise.  Labs drawn today.  Orders: -     Lipid panel  Right leg pain -     Meloxicam; Take 1 tablet (15 mg total) by mouth daily.  Dispense: 90 tablet; Refill: 1  Diabetic polyneuropathy associated with type 2 diabetes mellitus (HCC) Assessment & Plan: Control: Uncotrolled Recommend check sugars fasting daily. Recommend check feet daily. Recommend annual eye exams. Medicines:  Metformin 1000 mg twice daily  and tresiba 30 units daily, Humulin sliding scale Continue to work on eating a healthy diet and exercise.  Labs drawn today.       Orders: -     Microalbumin / creatinine urine ratio -     Evaristo Bury FlexTouch; INJECT 45 UNITS SUBCUTANEOUSLY ONCE DAILY  Dispense: 45 mL; Refill: 0  Primary insomnia Assessment & Plan: Increase ambien 10 mg daily as needed for sleep.    Orders: -     Zolpidem Tartrate; Take 1 tablet (10 mg total) by mouth at bedtime as needed for sleep.  Dispense: 30 tablet; Refill: 5    Meds ordered this encounter  Medications   meloxicam (MOBIC) 15 MG tablet    Sig: Take 1 tablet (15 mg total) by mouth daily.    Dispense:  90 tablet    Refill:  1   TRESIBA FLEXTOUCH 100 UNIT/ML FlexTouch Pen    Sig: INJECT 45 UNITS SUBCUTANEOUSLY ONCE DAILY    Dispense:  45 mL    Refill:  0    Please call the office and schedule a fasting appointment. Thank you,  Dr. Sedalia Muta   metoprolol succinate (TOPROL-XL) 25 MG 24 hr tablet    Sig: Take 1 tablet (25 mg total) by mouth daily.    Dispense:  90 tablet    Refill:  1    Patient needs an appointment with provider for more refills.   zolpidem (AMBIEN) 10 MG tablet    Sig: Take 1 tablet (10 mg total) by mouth at bedtime as needed for sleep.    Dispense:  30 tablet    Refill:  5   insulin lispro (HUMALOG KWIKPEN) 200 UNIT/ML KwikPen    Sig: Check sugars before meals Start 4 U prior to breakfast, lunch, and supper. + 1 u if sugar is greater than 200. + 2 u if sugar between 201-250 + 3 U if sugar between 251-300 + 4 U if sugar between 301-350 + 5 U if sugar between 351-400 + 6 U if sugar > 400.    Dispense:  15 mL    Refill:  1    Orders Placed This Encounter  Procedures   CBC with Differential/Platelet   Comprehensive metabolic panel   Hemoglobin A1c   Lipid panel   Microalbumin/Creatinine Ratio, Urine     Follow-up: Return in about 3 months (around 02/10/2023) for chronic fasting.   I,[name] I Leal-Borjas,acting as a scribe for Blane Ohara, MD.,have documented all relevant documentation on the behalf of Blane Ohara, MD,as directed by  Blane Ohara, MD while in the presence of Blane Ohara, MD.   An After Visit Summary was printed and given to the patient.  I attest that I have reviewed this visit and agree with the plan scribed by my staff.   Blane Ohara, MD [facility] [phone]    Blane Ohara, MD [facility] [phone]

## 2022-11-09 NOTE — Assessment & Plan Note (Signed)
Well controlled.  No medication changes recommended. currently on metoprolol 25 mg daily and lisinopril 2.5 mg daily. Continue healthy diet and exercise.   

## 2022-11-10 ENCOUNTER — Encounter: Payer: Self-pay | Admitting: Family Medicine

## 2022-11-10 ENCOUNTER — Ambulatory Visit (INDEPENDENT_AMBULATORY_CARE_PROVIDER_SITE_OTHER): Payer: PPO | Admitting: Family Medicine

## 2022-11-10 VITALS — BP 116/78 | HR 78 | Temp 97.3°F | Resp 18 | Ht 65.0 in | Wt 187.0 lb

## 2022-11-10 DIAGNOSIS — E1165 Type 2 diabetes mellitus with hyperglycemia: Secondary | ICD-10-CM | POA: Diagnosis not present

## 2022-11-10 DIAGNOSIS — Z794 Long term (current) use of insulin: Secondary | ICD-10-CM | POA: Diagnosis not present

## 2022-11-10 DIAGNOSIS — K2100 Gastro-esophageal reflux disease with esophagitis, without bleeding: Secondary | ICD-10-CM

## 2022-11-10 DIAGNOSIS — E782 Mixed hyperlipidemia: Secondary | ICD-10-CM | POA: Diagnosis not present

## 2022-11-10 DIAGNOSIS — E1142 Type 2 diabetes mellitus with diabetic polyneuropathy: Secondary | ICD-10-CM | POA: Diagnosis not present

## 2022-11-10 DIAGNOSIS — M79604 Pain in right leg: Secondary | ICD-10-CM

## 2022-11-10 DIAGNOSIS — F5101 Primary insomnia: Secondary | ICD-10-CM | POA: Diagnosis not present

## 2022-11-10 DIAGNOSIS — I10 Essential (primary) hypertension: Secondary | ICD-10-CM

## 2022-11-10 DIAGNOSIS — I119 Hypertensive heart disease without heart failure: Secondary | ICD-10-CM

## 2022-11-10 MED ORDER — MELOXICAM 15 MG PO TABS: 15.0000 mg | ORAL_TABLET | Freq: Every day | ORAL | 1 refills | Status: DC

## 2022-11-10 MED ORDER — METOPROLOL SUCCINATE ER 25 MG PO TB24: 25.0000 mg | ORAL_TABLET | Freq: Every day | ORAL | 1 refills | Status: DC

## 2022-11-10 MED ORDER — TRESIBA FLEXTOUCH 100 UNIT/ML ~~LOC~~ SOPN: PEN_INJECTOR | SUBCUTANEOUS | 0 refills | Status: DC

## 2022-11-10 MED ORDER — HUMALOG KWIKPEN 200 UNIT/ML ~~LOC~~ SOPN: PEN_INJECTOR | SUBCUTANEOUS | 1 refills | Status: DC

## 2022-11-10 MED ORDER — ZOLPIDEM TARTRATE 10 MG PO TABS
10.0000 mg | ORAL_TABLET | Freq: Every evening | ORAL | 5 refills | Status: DC | PRN
Start: 2022-11-10 — End: 2023-10-15

## 2022-11-10 NOTE — Patient Instructions (Signed)
Increase ambien 10 mg daily as needed for sleep.

## 2022-11-11 DIAGNOSIS — M79604 Pain in right leg: Secondary | ICD-10-CM | POA: Insufficient documentation

## 2022-11-11 DIAGNOSIS — F5101 Primary insomnia: Secondary | ICD-10-CM | POA: Insufficient documentation

## 2022-11-11 LAB — CBC WITH DIFFERENTIAL/PLATELET
Basophils Absolute: 0 10*3/uL (ref 0.0–0.2)
Basos: 1 %
EOS (ABSOLUTE): 0.1 10*3/uL (ref 0.0–0.4)
Eos: 2 %
Hematocrit: 48.1 % (ref 37.5–51.0)
Hemoglobin: 16.1 g/dL (ref 13.0–17.7)
Immature Grans (Abs): 0 10*3/uL (ref 0.0–0.1)
Immature Granulocytes: 0 %
Lymphocytes Absolute: 0.9 10*3/uL (ref 0.7–3.1)
Lymphs: 17 %
MCH: 28 pg (ref 26.6–33.0)
MCHC: 33.5 g/dL (ref 31.5–35.7)
MCV: 84 fL (ref 79–97)
Monocytes Absolute: 0.4 10*3/uL (ref 0.1–0.9)
Monocytes: 6 %
Neutrophils Absolute: 4.1 10*3/uL (ref 1.4–7.0)
Neutrophils: 74 %
Platelets: 167 10*3/uL (ref 150–450)
RBC: 5.75 x10E6/uL (ref 4.14–5.80)
RDW: 13.8 % (ref 11.6–15.4)
WBC: 5.5 10*3/uL (ref 3.4–10.8)

## 2022-11-11 LAB — COMPREHENSIVE METABOLIC PANEL
ALT: 25 IU/L (ref 0–44)
AST: 27 IU/L (ref 0–40)
Albumin: 4.6 g/dL (ref 3.9–4.9)
Alkaline Phosphatase: 71 IU/L (ref 44–121)
BUN/Creatinine Ratio: 15 (ref 10–24)
BUN: 16 mg/dL (ref 8–27)
Bilirubin Total: 0.7 mg/dL (ref 0.0–1.2)
CO2: 22 mmol/L (ref 20–29)
Calcium: 9.7 mg/dL (ref 8.6–10.2)
Chloride: 102 mmol/L (ref 96–106)
Creatinine, Ser: 1.09 mg/dL (ref 0.76–1.27)
Globulin, Total: 2.2 g/dL (ref 1.5–4.5)
Glucose: 144 mg/dL — ABNORMAL HIGH (ref 70–99)
Potassium: 4.8 mmol/L (ref 3.5–5.2)
Sodium: 140 mmol/L (ref 134–144)
Total Protein: 6.8 g/dL (ref 6.0–8.5)
eGFR: 74 mL/min/{1.73_m2} (ref >59)

## 2022-11-11 LAB — LIPID PANEL
Chol/HDL Ratio: 3.7 ratio (ref 0.0–5.0)
Cholesterol, Total: 148 mg/dL (ref 100–199)
HDL: 40 mg/dL (ref >39)
LDL Chol Calc (NIH): 87 mg/dL (ref 0–99)
Triglycerides: 118 mg/dL (ref 0–149)
VLDL Cholesterol Cal: 21 mg/dL (ref 5–40)

## 2022-11-11 LAB — HEMOGLOBIN A1C
Est. average glucose Bld gHb Est-mCnc: 235 mg/dL
Hgb A1c MFr Bld: 9.8 % — ABNORMAL HIGH (ref 4.8–5.6)

## 2022-11-11 LAB — MICROALBUMIN / CREATININE URINE RATIO
Creatinine, Urine: 74.2 mg/dL
Microalb/Creat Ratio: 54 mg/g creat — ABNORMAL HIGH (ref 0–29)
Microalbumin, Urine: 40.3 ug/mL

## 2022-11-11 NOTE — Assessment & Plan Note (Signed)
Increase ambien 10 mg daily as needed for sleep.   

## 2022-11-11 NOTE — Assessment & Plan Note (Signed)
Control: Uncotrolled Recommend check sugars fasting daily. Recommend check feet daily. Recommend annual eye exams. Medicines:  Metformin 1000 mg twice daily  and tresiba 30 units daily, Humulin sliding scale Continue to work on eating a healthy diet and exercise.  Labs drawn today.

## 2022-11-13 ENCOUNTER — Other Ambulatory Visit: Payer: Self-pay | Admitting: Family Medicine

## 2022-11-14 MED ORDER — EMPAGLIFLOZIN 10 MG PO TABS: 10.0000 mg | ORAL_TABLET | Freq: Every day | ORAL | 0 refills | Status: DC

## 2022-11-14 MED ORDER — VITAMIN D 25 MCG (1000 UNIT) PO TABS: 1000.0000 [IU] | ORAL_TABLET | Freq: Every day | ORAL | 1 refills | Status: AC

## 2022-11-17 ENCOUNTER — Other Ambulatory Visit: Payer: Self-pay

## 2022-11-17 ENCOUNTER — Telehealth: Payer: Self-pay

## 2022-11-17 MED ORDER — SYNJARDY XR 12.5-1000 MG PO TB24: 2.0000 | ORAL_TABLET | Freq: Every day | ORAL | 2 refills | Status: DC

## 2022-11-17 NOTE — Telephone Encounter (Signed)
Patient called stating that he got a message from his pharmacy stating that his insurance is not going to pay for the Synjardy XR and it would cost him 141$ which he states he cannot afford. He is wanting to know if you want him to be changed to something different or what you would recommend. Please advise.

## 2022-11-18 ENCOUNTER — Other Ambulatory Visit: Payer: Self-pay | Admitting: Family Medicine

## 2022-11-18 ENCOUNTER — Other Ambulatory Visit: Payer: Self-pay

## 2022-11-18 ENCOUNTER — Ambulatory Visit: Payer: PPO | Admitting: Family Medicine

## 2022-11-18 DIAGNOSIS — E088 Diabetes mellitus due to underlying condition with unspecified complications: Secondary | ICD-10-CM

## 2022-11-18 MED ORDER — METFORMIN HCL 1000 MG PO TABS
1000.0000 mg | ORAL_TABLET | Freq: Two times a day (BID) | ORAL | 0 refills | Status: DC
Start: 2022-11-18 — End: 2022-11-18

## 2022-11-18 MED ORDER — EMPAGLIFLOZIN 25 MG PO TABS: 25.0000 mg | ORAL_TABLET | Freq: Every day | ORAL | 0 refills | Status: DC

## 2022-11-18 NOTE — Telephone Encounter (Signed)
I see metformin and jardiance in his med list. Last ov does not mention jardiance. Do not see he has ever been on synjardy.

## 2022-11-18 NOTE — Telephone Encounter (Signed)
Patient informed and RX refills and increased dose of the Jardiance was sent for the 25 mg.

## 2022-11-20 ENCOUNTER — Other Ambulatory Visit: Payer: Self-pay | Admitting: Cardiology

## 2022-11-20 DIAGNOSIS — I25118 Atherosclerotic heart disease of native coronary artery with other forms of angina pectoris: Secondary | ICD-10-CM

## 2022-11-21 NOTE — Telephone Encounter (Signed)
RX sent

## 2022-11-27 ENCOUNTER — Telehealth: Payer: Self-pay

## 2022-11-27 ENCOUNTER — Other Ambulatory Visit: Payer: Self-pay | Admitting: Family Medicine

## 2022-11-27 DIAGNOSIS — I119 Hypertensive heart disease without heart failure: Secondary | ICD-10-CM

## 2022-11-27 DIAGNOSIS — Z794 Long term (current) use of insulin: Secondary | ICD-10-CM

## 2022-11-27 NOTE — Telephone Encounter (Signed)
Sent chronic care management referral.  See if we have any Jardiance samples that we can give him.

## 2022-11-27 NOTE — Telephone Encounter (Signed)
Patient called stating that his [place] Pepper was going to cost him 400$. Patient stated that the pharmacy told him it was because it was due to him meeting his prescription deductible and being in the donut hole. I gave patient 1 months worth of the Jardiance 25 mg in samples. Was going to see if you can set him up with our Pharmacist to see if they get can him some patient assistance? Please advise.

## 2022-11-27 NOTE — Telephone Encounter (Signed)
Patient given Jardiance samples and informed.

## 2022-11-28 DIAGNOSIS — R0989 Other specified symptoms and signs involving the circulatory and respiratory systems: Secondary | ICD-10-CM | POA: Diagnosis not present

## 2022-11-28 DIAGNOSIS — I219 Acute myocardial infarction, unspecified: Secondary | ICD-10-CM | POA: Diagnosis not present

## 2022-11-28 DIAGNOSIS — I10 Essential (primary) hypertension: Secondary | ICD-10-CM | POA: Diagnosis not present

## 2022-11-28 DIAGNOSIS — R9431 Abnormal electrocardiogram [ECG] [EKG]: Secondary | ICD-10-CM | POA: Diagnosis not present

## 2022-11-28 DIAGNOSIS — E119 Type 2 diabetes mellitus without complications: Secondary | ICD-10-CM | POA: Diagnosis not present

## 2022-11-28 DIAGNOSIS — Z955 Presence of coronary angioplasty implant and graft: Secondary | ICD-10-CM | POA: Diagnosis not present

## 2022-11-28 DIAGNOSIS — R0602 Shortness of breath: Secondary | ICD-10-CM | POA: Diagnosis not present

## 2022-11-28 DIAGNOSIS — R079 Chest pain, unspecified: Secondary | ICD-10-CM | POA: Diagnosis not present

## 2022-11-28 DIAGNOSIS — I4519 Other right bundle-branch block: Secondary | ICD-10-CM | POA: Diagnosis not present

## 2022-12-04 ENCOUNTER — Telehealth: Payer: Self-pay

## 2022-12-04 NOTE — Progress Notes (Signed)
   Care Guide Note  12/04/2022 Name: Mitchell Hammond MRN: [id number] DOB: 22-Aug-1955  Referred by: Blane Ohara, MD Reason for referral : Care Coordination (Outreach to schedule with Pharm d )   Mitchell Hammond is a 67 y.o. year old male who is a primary care patient of Cox, Kirsten, MD. Mitchell Hammond was referred to the pharmacist for assistance related to DM.    Successful contact was made with the patient to discuss pharmacy services including being ready for the pharmacist to call at least 5 minutes before the scheduled appointment time, to have medication bottles and any blood sugar or blood pressure readings ready for review. The patient agreed to meet with the pharmacist via with the pharmacist via telephone visit on (date/time).  12/22/2022  Penne Lash, RMA Care Guide [facility]  [place], [place] [address] Direct Dial: [phone] [email or web address]

## 2022-12-17 ENCOUNTER — Other Ambulatory Visit: Payer: Self-pay | Admitting: Physician Assistant

## 2022-12-17 ENCOUNTER — Ambulatory Visit (INDEPENDENT_AMBULATORY_CARE_PROVIDER_SITE_OTHER): Payer: PPO | Admitting: Physician Assistant

## 2022-12-17 ENCOUNTER — Encounter: Payer: Self-pay | Admitting: Physician Assistant

## 2022-12-17 VITALS — BP 132/68 | HR 93 | Temp 97.3°F | Ht 65.0 in | Wt 183.0 lb

## 2022-12-17 DIAGNOSIS — G44011 Episodic cluster headache, intractable: Secondary | ICD-10-CM

## 2022-12-17 DIAGNOSIS — R519 Headache, unspecified: Secondary | ICD-10-CM

## 2022-12-17 DIAGNOSIS — G44019 Episodic cluster headache, not intractable: Secondary | ICD-10-CM | POA: Diagnosis not present

## 2022-12-17 MED ORDER — NURTEC 75 MG PO TBDP
75.0000 mg | ORAL_TABLET | Freq: Once | ORAL | Status: AC
Start: 2022-12-17 — End: 2022-12-17

## 2022-12-17 MED ORDER — INDOMETHACIN ER 75 MG PO CPCR
75.0000 mg | ORAL_CAPSULE | Freq: Two times a day (BID) | ORAL | 0 refills | Status: DC
Start: 2022-12-17 — End: 2023-03-31

## 2022-12-17 MED ORDER — NURTEC 75 MG PO TBDP
ORAL_TABLET | ORAL | 1 refills | Status: DC
Start: 2022-12-17 — End: 2023-04-01

## 2022-12-17 NOTE — Assessment & Plan Note (Addendum)
Prescribed Nurtec 75mg  Will let us know if symptoms change or improve.

## 2022-12-17 NOTE — Assessment & Plan Note (Signed)
Oxygen therapy along with Nurtec did not improve symptoms Patient states pain is now more like a hot knife in the back of his head. Denies any vision changes

## 2022-12-17 NOTE — Progress Notes (Signed)
Acute Office Visit  Subjective:    Patient ID: Mitchell Hammond, male    DOB: 10/22/1955, 67 y.o.   MRN: [id number]  Chief Complaint  Patient presents with   Pain    HPI: Patient is in today for sharp pains in front left forehead and left side of neck. Pain is constant but worse than others at times. Tylenol and Ibuprofen do not help. Unsure of anything that makes it worse or better. Can be sedimentary and pain will come. States it is not a headache or migraine and feels it is getting worse meaning pain is more frequent and intense. Always has a throbbing feeling. Sharp pains are what will wake him from his sleep. States around his jaw is sore. Patient states that he has been on his Nitroglycerin since 2008. Has to take it about 2 times a month at least because of his chest pain. Denies any current chest pain, SOB, vision changes, dizziness, syncope.   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   Diabetes mellitus due to underlying condition with unspecified complications (HCC) 12/26/2015   Diabetic polyneuropathy associated with type 2 diabetes mellitus (HCC) 08/20/2016   Discitis    Essential hypertension 12/26/2015   Fracture, tibial plateau 12/18/2014   Hypertension    IDDM (insulin dependent diabetes mellitus)    type 2   Metatarsalgia of both feet 08/20/2016   MI (myocardial infarction) (HCC)    x 2, Multi-link Vision 3.5 x 15 mm BMS in 2009 (mid LAD), balloon 2015   Mixed hyperlipidemia 06/14/2020   Obesity (BMI 30.0-34.9) 06/26/2020   Tibial plateau fracture 12/18/2014   Vitamin D insufficiency     Past Surgical History:  Procedure Laterality Date   BACK SURGERY     2014   CARDIAC CATHETERIZATION     x 2, 2009 & 2015    COLONOSCOPY     CORONARY ANGIOPLASTY     stent - 2008, balloon - 2015   FASCIOTOMY Right 12/19/2014   Procedure: ANTERIOR COMPARTMENTAL FASCIOTOMY;  Surgeon: Myrene Galas, MD;  Location: [facility] OR;  Service: Orthopedics;  Laterality: Right;   HAND SURGERY Left    HARDWARE REMOVAL Right 07/24/2015   Procedure: HARDWARE REMOVAL RIGHT TIBIA;  Surgeon: Myrene Galas, MD;  Location: [facility] OR;  Service: Orthopedics;  Laterality: Right;   KNEE ARTHROSCOPY Right 07/24/2015   Procedure: RIGHT ARTHROSCOPY KNEE WITH PARTIAL SYNOVECTOMY AND MEDIAL MENISCECTOMY;  Surgeon: Myrene Galas, MD;  Location: [facility] OR;  Service: Orthopedics;  Laterality: Right;   LEFT HEART CATH AND CORONARY ANGIOGRAPHY N/A 07/02/2020   Procedure: LEFT HEART CATH AND CORONARY ANGIOGRAPHY;  Surgeon: Lennette Bihari, MD;  Location: [facility] INVASIVE CV LAB;  Service: Cardiovascular;  Laterality: N/A;   LEFT HEART CATH AND CORONARY ANGIOGRAPHY N/A 01/22/2022   Procedure: LEFT HEART CATH AND CORONARY ANGIOGRAPHY;  Surgeon: Iran Ouch, MD;  Location: [facility] INVASIVE CV LAB;  Service: Cardiovascular;  Laterality: N/A;   ORIF TIBIA PLATEAU Right 12/19/2014   Procedure: OPEN REDUCTION INTERNAL FIXATION (ORIF) RIGHT TIBIAL PLATEAU;  Surgeon: Myrene Galas, MD;  Location: [facility] OR;  Service: Orthopedics;  Laterality: Right;   TONSILLECTOMY     VASECTOMY      Family  History  Problem Relation Age of Onset   Diabetes Mother    CAD Mother    Liver disease Father     Social History   Socioeconomic History   Marital status: Married    Spouse name: Not on file   Number of children: 2   Years of education: Not on file   Highest education level: Not on file  Occupational History   Occupation: disabled  Tobacco Use   Smoking status: Former    Current packs/day: 0.00    Types: Cigarettes    Quit date: 02/20/2007    Years since quitting: 15.8   Smokeless tobacco: Never  Vaping Use   Vaping status: Never Used  Substance and Sexual Activity   Alcohol use:  No    Alcohol/week: 0.0 standard drinks of alcohol    Comment: None in years   Drug use: No   Sexual activity: Yes    Partners: Female  Other Topics Concern   Not on file  Social History Narrative   Pt lives with wife and daughter in [place].    Social Determinants of Health   Financial Resource Strain: Low Risk  (11/14/2021)   Overall Financial Resource Strain (CARDIA)    Difficulty of Paying Living Expenses: Not hard at all  Food Insecurity: No Food Insecurity (08/29/2020)   Hunger Vital Sign    Worried About Running Out of Food in the Last Year: Never true    Ran Out of Food in the Last Year: Never true  Transportation Needs: No Transportation Needs (11/14/2021)   PRAPARE - Administrator, Civil Service (Medical): No    Lack of Transportation (Non-Medical): No  Physical Activity: Inactive (06/24/2022)   Exercise Vital Sign    Days of Exercise per Week: 0 days    Minutes of Exercise per Session: 0 min  Stress: No Stress Concern Present (06/24/2022)   [employer] of Occupational Health - Occupational Stress Questionnaire    Feeling of Stress : Not at all  Social Connections: Moderately Integrated (06/24/2022)   Social Connection and Isolation Panel [NHANES]    Frequency of Communication with Friends and Family: More than three times a week    Frequency of Social Gatherings with Friends and Family: More than three times a week    Attends Religious Services: More than 4 times per year    Active Member of [employer] or Organizations: No    Attends Banker Meetings: Never    Marital Status: Married  Catering manager Violence: Not At Risk (06/24/2022)   Humiliation, Afraid, Rape, and Kick questionnaire    Fear of Current or Ex-Partner: No    Emotionally Abused: No    Physically Abused: No    Sexually Abused: No    Outpatient Medications Prior to Visit  Medication Sig Dispense Refill   albuterol (VENTOLIN HFA) 108 (90 Base) MCG/ACT inhaler Inhale 2 puffs into  the lungs every 6 (six) hours as needed for wheezing or shortness of breath. 8 g 3   aspirin EC 81 MG tablet Take 81 mg by mouth daily.     atorvastatin (LIPITOR) 40 MG tablet Take 1 tablet (40 mg total) by mouth at bedtime. 90 tablet 1   cholecalciferol (VITAMIN D3) 25 MCG (1000 UNIT) tablet Take 1 tablet (1,000 Units total) by mouth daily. 30 tablet 1   Continuous Glucose Sensor (FREESTYLE LIBRE 14 DAY SENSOR) MISC USE AS DIRECTED EVERY  14  DAYS 3 each 6   empagliflozin (  JARDIANCE) 25 MG TABS tablet Take 1 tablet (25 mg total) by mouth daily before breakfast. 90 tablet 0   fenofibrate (TRICOR) 145 MG tablet Take 1 tablet (145 mg total) by mouth at bedtime. 90 tablet 1   Glucagon (GVOKE HYPOPEN 2-PACK) 1 MG/0.2ML SOAJ Inject 1 Dose into the skin as needed (hypoglycemia). 0.2 mL 1   insulin lispro (HUMALOG KWIKPEN) 200 UNIT/ML KwikPen Check sugars before meals Start 4 U prior to breakfast, lunch, and supper. + 1 u if sugar is greater than 200. + 2 u if sugar between 201-250 + 3 U if sugar between 251-300 + 4 U if sugar between 301-350 + 5 U if sugar between 351-400 + 6 U if sugar > 400. 15 mL 1   lisinopril (ZESTRIL) 2.5 MG tablet Take 1 tablet (2.5 mg total) by mouth daily. 90 tablet 1   meloxicam (MOBIC) 15 MG tablet Take 1 tablet (15 mg total) by mouth daily. 90 tablet 1   metFORMIN (GLUCOPHAGE) 1000 MG tablet TAKE 1 TABLET BY MOUTH TWICE DAILY 60 tablet 0   metoprolol succinate (TOPROL-XL) 25 MG 24 hr tablet Take 1 tablet (25 mg total) by mouth daily. 90 tablet 1   mupirocin ointment (BACTROBAN) 2 % Apply topically 2 (two) times daily. 22 g 6   nitroGLYCERIN (NITROSTAT) 0.4 MG SL tablet DISSOLVE ONE TABLET UNDER THE TONGUE EVERY 5 MINUTES AS NEEDED FOR CHEST PAIN.  DO NOT EXCEED A TOTAL OF 3 DOSES IN 15 MINUTES 25 tablet 0   omeprazole (PRILOSEC) 40 MG capsule Take 1 capsule (40 mg total) by mouth daily. 30 capsule 3   ondansetron (ZOFRAN) 4 MG tablet Take 1 tablet (4 mg total) by mouth every 8  (eight) hours as needed for nausea or vomiting. 20 tablet 0   ONETOUCH VERIO test strip 1 each by Other route daily. 100 each 2   pregabalin (LYRICA) 75 MG capsule Take 1 capsule (75 mg total) by mouth 2 (two) times daily. 60 capsule 2   SHINGRIX injection      TRESIBA FLEXTOUCH 100 UNIT/ML FlexTouch Pen INJECT 45 UNITS SUBCUTANEOUSLY ONCE DAILY 45 mL 0   vitamin C (VITAMIN C) 500 MG tablet Take 1 tablet (500 mg total) by mouth 2 (two) times daily. 60 tablet 2   zolpidem (AMBIEN) 10 MG tablet Take 1 tablet (10 mg total) by mouth at bedtime as needed for sleep. 30 tablet 5   No facility-administered medications prior to visit.    Allergies  Allergen Reactions   Ozempic (0.25 Or 0.5 Mg-Dose) [Semaglutide(0.25 Or 0.5mg -Dos)]     Abdominal pain    Review of Systems  Constitutional:  Negative for chills, fatigue and fever.  HENT:  Negative for congestion, ear pain, facial swelling, postnasal drip, rhinorrhea, sinus pressure, sinus pain and sore throat.   Eyes:  Negative for photophobia and visual disturbance.  Respiratory:  Negative for cough and shortness of breath.   Cardiovascular:  Negative for chest pain.  Gastrointestinal:  Negative for diarrhea, nausea and vomiting.  Neurological:  Negative for dizziness and headaches.       Objective:        12/17/2022    8:00 AM 11/10/2022    9:51 AM 06/24/2022    8:58 AM  Vitals with BMI  Height 5\' 5"  5\' 5"  5\' 5"   Weight 183 lbs 187 lbs 187 lbs  BMI 30.45 31.12 31.12  Systolic 132 116 086  Diastolic 68 78 58  Pulse 93 78 78  No data found.   Physical Exam Vitals reviewed.  Constitutional:      Appearance: Normal appearance.  HENT:     Head:     Jaw: Tenderness present.   Eyes:     General: Lids are normal. Vision grossly intact. Gaze aligned appropriately. No visual field deficit or scleral icterus.    Extraocular Movements: Extraocular movements intact.     Right eye: No nystagmus.     Left eye: No nystagmus.      Conjunctiva/sclera: Conjunctivae normal.  Cardiovascular:     Rate and Rhythm: Normal rate and regular rhythm.     Heart sounds: Normal heart sounds.  Pulmonary:     Effort: Pulmonary effort is normal.     Breath sounds: Normal breath sounds.  Abdominal:     General: Bowel sounds are normal.     Palpations: Abdomen is soft.     Tenderness: There is no abdominal tenderness.  Neurological:     Mental Status: He is alert and oriented to person, place, and time.     Cranial Nerves: Cranial nerves 2-12 are intact. No facial asymmetry.     Motor: Motor function is intact.     Coordination: Coordination is intact.     Gait: Gait is intact.     Deep Tendon Reflexes: Reflexes are normal and symmetric.  Psychiatric:        Mood and Affect: Mood normal.        Behavior: Behavior normal.     Health Maintenance Due  Topic Date Due   Medicare Annual Wellness (AWV)  Never done   Colonoscopy  Never done   COVID-19 Vaccine (4 - 2023-24 season) 01/17/2022   Zoster Vaccines- Shingrix (2 of 2) 03/07/2022    There are no preventive care reminders to display for this patient.   Lab Results  Component Value Date   TSH 2.829 12/18/2014   Lab Results  Component Value Date   WBC 5.5 11/10/2022   HGB 16.1 11/10/2022   HCT 48.1 11/10/2022   MCV 84 11/10/2022   PLT 167 11/10/2022   Lab Results  Component Value Date   NA 140 11/10/2022   K 4.8 11/10/2022   CO2 22 11/10/2022   GLUCOSE 144 (H) 11/10/2022   BUN 16 11/10/2022   CREATININE 1.09 11/10/2022   BILITOT 0.7 11/10/2022   ALKPHOS 71 11/10/2022   AST 27 11/10/2022   ALT 25 11/10/2022   PROT 6.8 11/10/2022   ALBUMIN 4.6 11/10/2022   CALCIUM 9.7 11/10/2022   ANIONGAP 12 07/24/2015   EGFR 74 11/10/2022   Lab Results  Component Value Date   CHOL 148 11/10/2022   Lab Results  Component Value Date   HDL 40 11/10/2022   Lab Results  Component Value Date   LDLCALC 87 11/10/2022   Lab Results  Component Value Date   TRIG  118 11/10/2022   Lab Results  Component Value Date   CHOLHDL 3.7 11/10/2022   Lab Results  Component Value Date   HGBA1C 9.8 (H) 11/10/2022       Assessment & Plan:  Episodic cluster headache, not intractable Assessment & Plan: Prescribed Nurtec 75mg  Will let us know if symptoms change or improve.  Orders: -     Nurtec; Take 1 tablet (75 mg total) by mouth once for 1 dose.  Dispense: 2 tablet  Severe headache Assessment & Plan: Oxygen therapy along with Nurtec did not improve symptoms Patient states pain is now more like a hot  knife in the back of his head. Denies any vision changes  Orders: -     CT HEAD WO CONTRAST ( ); Future  Episodic cluster headache, intractable Assessment & Plan: Prescribed Nurtec 75mg  Will let us know if symptoms change or improve.      Meds ordered this encounter  Medications   Rimegepant Sulfate (NURTEC) 75 MG TBDP    Sig: Take 1 tablet (75 mg total) by mouth once for 1 dose.    Dispense:  2 tablet    Orders Placed This Encounter  Procedures   CT HEAD WO CONTRAST ( )     Follow-up: No follow-ups on file.  An After Visit Summary was printed and given to the patient.  Langley Gauss, [place] [facility] [phone]

## 2022-12-18 ENCOUNTER — Telehealth: Payer: Self-pay

## 2022-12-18 NOTE — Telephone Encounter (Signed)
PA submitted and approved via covermymeds for nurtec. 

## 2022-12-22 ENCOUNTER — Other Ambulatory Visit: Payer: PPO

## 2022-12-22 ENCOUNTER — Other Ambulatory Visit: Payer: Self-pay | Admitting: Family Medicine

## 2022-12-22 DIAGNOSIS — E088 Diabetes mellitus due to underlying condition with unspecified complications: Secondary | ICD-10-CM

## 2022-12-22 NOTE — Progress Notes (Signed)
12/22/2022 Name: Mitchell Hammond MRN: [id number] DOB: 1955/10/13  Chief Complaint  Patient presents with   Medication Assistance   Mitchell Hammond is a 67 y.o. year old male who presented for a telephone visit.   They were referred to the pharmacist by their PCP for assistance in managing medication access.   Subjective:  Care Team: Primary Care Provider: Blane Ohara, MD ; Next Scheduled Visit: 02/26/23  Medication Access/Adherence Current Pharmacy:  [facility] [address], [place] - [address] [place] [address] Phone: [phone] Fax: [phone]  [facility] [id number] - [place], [place] - [address] AT [facility] [address] [facility] [place] [address] Phone: [phone] Fax: [phone]  -Patient reports affordability concerns with their medications: Yes - Insurance is not covering Jardiance 25mg  daily -Patient reports access/transportation concerns to their pharmacy: No  -Patient reports adherence concerns with their medications:  Yes  -Insurance will only cover 18 tablets of Nurtec every 30 days  Diabetes: Current medications: Jardiance 25mg  daily, Humalog SSI, Tresiba 45 units daily, metformin 1000mg  BID -Medications tried in the past: Ozempic but patient was not able to tolerate -Current glucose readings: FBG 70-130 -Using Libre2 for CGM -Patient reports rare hypoglycemic s/sx including dizziness, shakiness, sweating.  -Patient denies hyperglycemic symptoms including polyuria, polydipsia, polyphagia, nocturia, neuropathy, blurred vision. -Patient has been unable to fill Jardiance 25mg  daily b/c insurance is not covering this dose increase.  He states he was previously getting Jardiance 10mg  daily at no cost. -All other diabetes medications go through on insurance at $0 copay  Hypertension: Current medications: lisinopril 2..5mg  daily, metoprolol xl 25mg  daily,  -Patient has a validated, automated,  upper arm home BP cuff -Current blood pressure readings readings: 120/90 -Patient denies hypotensive s/sx including dizziness, lightheadedness.  -Patient denies hypertensive symptoms including headache, chest pain, shortness of breath  Hyperlipidemia/ASCVD Risk Reduction Current lipid lowering medications: atorvastatin 40mg  daily, fenofibrate 145mg  daily Antiplatelet regimen: ASA 81mg  daily   Objective: Lab Results  Component Value Date   HGBA1C 9.8 (H) 11/10/2022   Lab Results  Component Value Date   CREATININE 1.09 11/10/2022   BUN 16 11/10/2022   NA 140 11/10/2022   K 4.8 11/10/2022   CL 102 11/10/2022   CO2 22 11/10/2022   Lab Results  Component Value Date   CHOL 148 11/10/2022   HDL 40 11/10/2022   LDLCALC 87 11/10/2022   TRIG 118 11/10/2022   CHOLHDL 3.7 11/10/2022   Medications Reviewed Today     Reviewed by Lenna Gilford, RPH (Pharmacist) on 12/22/22 at 1325  Med List Status: <None>   Medication Order Taking? Sig Documenting Provider Last Dose Status Informant  albuterol (VENTOLIN HFA) 108 (90 Base) MCG/ACT inhaler [id number]  Inhale 2 puffs into the lungs every 6 (six) hours as needed for wheezing or shortness of breath. Blane Ohara, MD  Active   aspirin EC 81 MG tablet [id number] Yes Take 81 mg by mouth daily. [provider] Taking Active Self  atorvastatin (LIPITOR) 40 MG tablet [id number] Yes Take 1 tablet (40 mg total) by mouth at bedtime. Cox, Kirsten, MD Taking Active   cholecalciferol (VITAMIN D3) 25 MCG (1000 UNIT) tablet [id number]  Take 1 tablet (1,000 Units total) by mouth daily. Cox, Kirsten, MD  Active   Continuous Glucose Sensor (FREESTYLE LIBRE 14 DAY [place]) [place] [id number] Yes USE AS DIRECTED EVERY  14  DAYS Cox, Kirsten, MD Taking Active  empagliflozin (JARDIANCE) 25 MG TABS tablet [id number] No Take 1 tablet (25 mg total) by mouth daily before breakfast.  Patient not taking: Reported on 12/22/2022   Blane Ohara, MD Not Taking Active    fenofibrate (TRICOR) 145 MG tablet [id number] Yes Take 1 tablet (145 mg total) by mouth at bedtime. Cox, Kirsten, MD Taking Active   Glucagon (GVOKE HYPOPEN 2-PACK) 1 MG/0.2ML [place] [id number]  Inject 1 Dose into the skin as needed (hypoglycemia). Janie Morning, NP  Active   indomethacin (INDOCIN SR) 75 MG CR capsule [id number]  Take 1 capsule (75 mg total) by mouth 2 (two) times daily with a meal. Langley Gauss, PA  Active   insulin lispro (HUMALOG KWIKPEN) 200 UNIT/ML KwikPen [id number] Yes Check sugars before meals Start 4 U prior to breakfast, lunch, and supper. + 1 u if sugar is greater than 200. + 2 u if sugar between 201-250 + 3 U if sugar between 251-300 + 4 U if sugar between 301-350 + 5 U if sugar between 351-400 + 6 U if sugar > 400. Cox, Kirsten, MD Taking Active   lisinopril (ZESTRIL) 2.5 MG tablet [id number] Yes Take 1 tablet (2.5 mg total) by mouth daily. Cox, Kirsten, MD Taking Active   meloxicam (MOBIC) 15 MG tablet [id number]  Take 1 tablet (15 mg total) by mouth daily. Cox, Kirsten, MD  Active   metFORMIN (GLUCOPHAGE) 1000 MG tablet [id number] Yes TAKE 1 TABLET BY MOUTH TWICE DAILY Cox, Kirsten, MD Taking Active   metoprolol succinate (TOPROL-XL) 25 MG 24 hr tablet [id number] Yes Take 1 tablet (25 mg total) by mouth daily. Cox, Kirsten, MD Taking Active   mupirocin ointment (BACTROBAN) 2 % [id number]  Apply topically 2 (two) times daily. Abigail Miyamoto, MD  Active   nitroGLYCERIN (NITROSTAT) 0.4 MG SL tablet [id number]  DISSOLVE ONE TABLET UNDER THE TONGUE EVERY 5 MINUTES AS NEEDED FOR CHEST PAIN.  DO NOT EXCEED A TOTAL OF 3 DOSES IN 15 MINUTES Revankar, Aundra Dubin, MD  Active   omeprazole (PRILOSEC) 40 MG capsule [id number]  Take 1 capsule (40 mg total) by mouth daily. Abigail Miyamoto, MD  Active   ondansetron [facility]) 4 MG tablet [id number]  Take 1 tablet (4 mg total) by mouth every 8 (eight) hours as needed for nausea or vomiting. Janie Morning, NP  Active   [facility] VERIO  test strip [id number]  1 each by Other route daily. Abigail Miyamoto, MD  Active   pregabalin (LYRICA) 75 MG capsule [id number]  Take 1 capsule (75 mg total) by mouth 2 (two) times daily. Cox, Kirsten, MD  Active   Rimegepant Sulfate (NURTEC) 75 MG TBDP [id number] No Take one tablet a day as needed  Patient not taking: Reported on 12/22/2022   Langley Gauss, [place] Not Taking Active   [facility] injection [id number]   [provider]  Active   TRESIBA FLEXTOUCH 100 UNIT/ML FlexTouch Pen [id number] Yes INJECT 45 UNITS SUBCUTANEOUSLY ONCE DAILY Cox, Kirsten, MD Taking Active   vitamin C (VITAMIN C) 500 MG tablet [id number]  Take 1 tablet (500 mg total) by mouth 2 (two) times daily. Montez Morita, PA-C  Active   zolpidem (AMBIEN) 10 MG tablet [id number]  Take 1 tablet (10 mg total) by mouth at bedtime as needed for sleep. Cox, Kirsten, MD  Active            Assessment/Plan:   Medication Access/Adherence -Prior authorization approved for Nurtec, but insurance has quantity limit based on manufacturer recommended  use:  every other day for prevention - can take 1 tablet on off day(s) if symptomatic but safety not established if using more than 18 doses/month -Will consult patient on recommended use and to contact Dr. Sedalia Muta if he is needing this medication more than 18 days  per month  Diabetes: - Currently uncontrolled - Uncontrolled based on most recent A1c, but home BG readings reflect much better control with current medication regimen - Likely that PA needs to be done for Jardiance 25mg  daily, so I am sending to plan through CoverMyMeds - If PA is denied, or copay not affordable when approved, we can look into PAP; but it is a more challenging one for patients to qualify for - Note from 7/11 states patient was given sample of Jardiance - Recommend f/u A1c at October PCP visit  Hypertension: - Currently moderately controlled - If BP consistently >130/80, could consider increasing lisinopril to  5mg  daily   Hyperlipidemia/ASCVD Risk Reduction: - Currently moderately controlled  - Could increase atorvastatin to 80mg  daily or change to rosuvastatin 40mg  daily in an attempt to achieve LDL <70  Follow Up Plan: Once PA status for [place] Pepper is known, I will contact patient to inform and discuss Nurtec use  Lenna Gilford, PharmD, DPLA

## 2022-12-23 NOTE — Progress Notes (Signed)
   12/23/2022 Name: Mitchell Hammond MRN: [id number] DOB: [birth year]  No chief complaint on file.   {Visit Type:26650}   Subjective:  Care Team: Primary Care Provider: Blane Ohara, MD ; Next Scheduled Visit: *** {careteamprovider:27366}  Medication Access/Adherence  Current Pharmacy:  [facility] [address] [place] - [address] [place] [address] Phone: [phone] Fax: [phone]  [facility] [id number] - [place], [place] - [address] AT [facility] [address] [facility] [address] [place] [address] Phone: [phone] Fax: [phone]   Patient reports affordability concerns with their medications: {YES/NO:21197} Patient reports access/transportation concerns to their pharmacy: {YES/NO:21197} Patient reports adherence concerns with their medications:  {YES/NO:21197} ***   {Pharmacy S/O Choices:26420}   Objective:  Lab Results  Component Value Date   HGBA1C 9.8 (H) 11/10/2022    Lab Results  Component Value Date   CREATININE 1.09 11/10/2022   BUN 16 11/10/2022   NA 140 11/10/2022   K 4.8 11/10/2022   CL 102 11/10/2022   CO2 22 11/10/2022    Lab Results  Component Value Date   CHOL 148 11/10/2022   HDL 40 11/10/2022   LDLCALC 87 11/10/2022   TRIG 118 11/10/2022   CHOLHDL 3.7 11/10/2022    Medications Reviewed Today   Medications were not reviewed in this encounter       Assessment/Plan:   {Pharmacy A/P Choices:26421}  Follow Up Plan: ***  ***

## 2022-12-24 ENCOUNTER — Telehealth: Payer: Self-pay

## 2022-12-24 ENCOUNTER — Ambulatory Visit: Payer: PPO | Attending: Cardiology | Admitting: Cardiology

## 2022-12-24 ENCOUNTER — Ambulatory Visit: Payer: PPO | Attending: Cardiology

## 2022-12-24 ENCOUNTER — Encounter: Payer: Self-pay | Admitting: Cardiology

## 2022-12-24 VITALS — BP 130/70 | HR 76 | Ht 65.0 in | Wt 188.6 lb

## 2022-12-24 DIAGNOSIS — I2510 Atherosclerotic heart disease of native coronary artery without angina pectoris: Secondary | ICD-10-CM

## 2022-12-24 DIAGNOSIS — I10 Essential (primary) hypertension: Secondary | ICD-10-CM

## 2022-12-24 DIAGNOSIS — E785 Hyperlipidemia, unspecified: Secondary | ICD-10-CM

## 2022-12-24 DIAGNOSIS — R002 Palpitations: Secondary | ICD-10-CM | POA: Diagnosis not present

## 2022-12-24 MED ORDER — ATORVASTATIN CALCIUM 80 MG PO TABS: 80.0000 mg | ORAL_TABLET | Freq: Every day | ORAL | 3 refills | Status: DC

## 2022-12-24 NOTE — Progress Notes (Signed)
   12/24/2022  Patient ID: Mitchell Hammond, male   DOB: [birth year], 67 y.o.   MRN: [id number]  Prior authorization was submitted Monday for Jardiance 25mg  daily in CoverMyMeds.    [employer] insurance plan today to check on status of PA, and it was canceled b/c one actually is not required.   [employer] pays for the medication, but it is a Tier 6 drug -I contacted [facility] pharmacy, and 90 days would be $415.  They were not able to tell me what a 30 day supply would be because the insurance went down during our phone call. -Sending a MyChart message to the patient to inform him the medication is covered and to call the pharmacy later/tomorrow to check the cost of a 30 day supply -If this will not be affordable, we can look into the [employer] Patient Assistance Program for him  Lenna Gilford, PharmD, DPLA

## 2022-12-24 NOTE — Patient Instructions (Addendum)
Medication Instructions:  Your physician has recommended you make the following change in your medication:   START: Lipitor 80 mg daily  *If you need a refill on your cardiac medications before your next appointment, please call your pharmacy*   Lab Work: Your physician recommends that you return for lab work in:   Labs in 8 weeks: LFT, Lipid  If you have labs (blood work) drawn today and your tests are completely normal, you will receive your results only by: MyChart Message (if you have MyChart) OR A paper copy in the mail If you have any lab test that is abnormal or we need to change your treatment, we will call you to review the results.   Testing/Procedures: A zio monitor was ordered today. It will remain on for 14 days. You will then return monitor and event diary in provided box. It takes 1-2 weeks for report to be downloaded and returned to [place]. We will call you with the results. If monitor falls off or has orange flashing light, please call Zio for further instructions.     Follow-Up: At [facility], you and your health needs are our priority.  As part of our continuing mission to provide you with exceptional heart care, we have created designated Provider Care Teams.  These Care Teams include your primary Cardiologist (physician) and Advanced Practice Providers (APPs -  Physician Assistants and Nurse Practitioners) who all work together to provide you with the care you need, when you need it.  We recommend signing up for the patient portal called "MyChart".  Sign up information is provided on this After Visit Summary.  MyChart is used to connect with patients for Virtual Visits (Telemedicine).  Patients are able to view lab/test results, encounter notes, upcoming appointments, etc.  Non-urgent messages can be sent to your provider as well.   To learn more about what you can do with MyChart, go to [email or web address].    Your next appointment:   6  month(s)  Provider:   Belva Crome, MD    Other Instructions None

## 2022-12-24 NOTE — Progress Notes (Signed)
Cardiology Office Note:  .   Date:  12/24/2022  ID:  Chans Ewert, DOB 21-Nov-1955, MRN [id number] PCP: Blane Ohara, MD  [facility] Providers Cardiologist:  Garwin Brothers, MD    History of Present Illness: Marland Kitchen   Zackarey Engquist is a 67 y.o. male with a past medical history of CAD s/p DES x 1 to mid LAD, hypertension, GERD, DM 2, hyperlipidemia.  07/25/2022 echocardiogram EF 55 to 60%, no valvular abnormalities. 01/22/2022 left heart cath revealed widely patent LAD stent with no restenosis. 07/02/2020 left heart cath mild nonobstructive CAD widely patent LAD stent  Most recently evaluated by Dr. Tomie China on 01/27/2022, he was doing well from a cardiac perspective and advised he could follow-up in 6 months.  He presents today for follow-up of his CAD.  He has been bothered by some palpitations that occur intermittently, states it feels like a vibrating sensation over his chest.  He does not have any other accompanying symptoms with this.  He denies chest pain, palpitations, dyspnea, pnd, orthopnea, n, v, dizziness, syncope, edema, weight gain, or early satiety.  ROS: Review of Systems  All other systems reviewed and are negative.    Studies Reviewed: .        Cardiac Studies & Procedures   CARDIAC CATHETERIZATION  CARDIAC CATHETERIZATION 01/22/2022  Narrative   Mid LM to Dist LM lesion is 10% stenosed.   Prox LAD lesion is 10% stenosed.   Mid LAD-1 lesion is 30% stenosed.   Prox Cx lesion is 30% stenosed.   Prox RCA to Mid RCA lesion is 10% stenosed.   Dist RCA lesion is 20% stenosed.   1st Mrg lesion is 20% stenosed.   Non-stenotic Mid LAD-2 lesion was previously treated.   The left ventricular systolic function is normal.   LV end diastolic pressure is mildly elevated.   The left ventricular ejection fraction is 55-65% by visual estimate.  1.  Widely patent LAD stent with no significant restenosis.  Mild nonobstructive coronary artery disease and no significant change since  most recent cardiac catheterization in 2022.  The right coronary artery is moderately ectatic. 2.  Normal LV systolic function and mildly elevated left ventricular end-diastolic pressure at 13 mmHg.  Recommendations: The chest pain is likely noncardiac. Continue medical therapy.  Findings Coronary Findings Diagnostic  Dominance: Right  Left Main Mid LM to Dist LM lesion is 10% stenosed.  Left Anterior Descending The vessel exhibits minimal luminal irregularities. Prox LAD lesion is 10% stenosed. Mid LAD-1 lesion is 30% stenosed. Non-stenotic Mid LAD-2 lesion was previously treated.  Left Circumflex Prox Cx lesion is 30% stenosed.  First Obtuse Marginal Branch 1st Mrg lesion is 20% stenosed.  Right Coronary Artery Prox RCA to Mid RCA lesion is 10% stenosed. Dist RCA lesion is 20% stenosed.  Intervention  No interventions have been documented.   CARDIAC CATHETERIZATION  CARDIAC CATHETERIZATION 07/02/2020  Narrative  Prox RCA to Mid RCA lesion is 10% stenosed.  Dist RCA lesion is 20% stenosed.  Mid LM to Dist LM lesion is 10% stenosed.  Prox Cx lesion is 30% stenosed.  1st Mrg lesion is 20% stenosed.  Prox LAD lesion is 10% stenosed.  Mid LAD-1 lesion is 30% stenosed.  Previously placed Mid LAD-2 stent (unknown type) is widely patent.  The left ventricular ejection fraction is 55-65% by visual estimate.  The left ventricular systolic function is normal.  LV end diastolic pressure is normal.  Mild nonobstructive multivessel CAD with luminal irregularities and  widely patent mid LAD stent.  Normal LV function without focal segmental wall motion abnormalities.  EF estimate 55 to 60%.  LVEDP 8 mmHg.  RECOMMENDATION: Medical therapy with optimal blood pressure control and lipid-lowering therapy with target LDL less than 70.  Patient will follow up with Dr. Tomie China.  Findings Coronary Findings Diagnostic  Dominance: Right  Left Main Mid LM to Dist LM  lesion is 10% stenosed.  Left Anterior Descending The vessel exhibits minimal luminal irregularities. Prox LAD lesion is 10% stenosed. Mid LAD-1 lesion is 30% stenosed. Previously placed Mid LAD-2 stent (unknown type) is widely patent.  Left Circumflex Prox Cx lesion is 30% stenosed.  First Obtuse Marginal Branch 1st Mrg lesion is 20% stenosed.  Right Coronary Artery Prox RCA to Mid RCA lesion is 10% stenosed. Dist RCA lesion is 20% stenosed.  Intervention  No interventions have been documented.     ECHOCARDIOGRAM  ECHOCARDIOGRAM COMPLETE 07/25/2022  Narrative ECHOCARDIOGRAM REPORT    Patient Name:   RANDALL WEIGOLD Date of Exam: 07/25/2022 Medical Rec #:  [id number]     Height:       65.0 in Accession #:    [id number]    Weight:       187.0 lb Date of Birth:  09-13-1955      BSA:          1.922 m Patient Age:    66 years      BP:           136/58 mmHg Patient Gender: M             HR:           64 bpm. Exam Location:  [facility]  Procedure: 2D Echo, 3D Echo, Cardiac Doppler, Color Doppler and Strain Analysis  Indications:    Shortness of breath [R06.02 (ICD-10-CM)]  History:        Patient has prior history of Echocardiogram examinations, most recent 01/20/2022. CAD and Previous Myocardial Infarction; Risk Factors:Hypertension, Dyslipidemia and Diabetes.  Sonographer:    Margreta Journey RDCS Referring Phys: [id number] KIRSTEN COX  IMPRESSIONS   1. GLS -17.6. Left ventricular ejection fraction, by estimation, is 55 to 60%. The left ventricle has normal function. The left ventricle has no regional wall motion abnormalities. Left ventricular diastolic parameters were normal. 2. Right ventricular systolic function is normal. The right ventricular size is normal. There is normal pulmonary artery systolic pressure. 3. The mitral valve is normal in structure. Trivial mitral valve regurgitation. No evidence of mitral stenosis. 4. The aortic valve is normal in structure. Aortic  valve regurgitation is not visualized. No aortic stenosis is present. 5. The inferior vena cava is normal in size with greater than 50% respiratory variability, suggesting right atrial pressure of 3 mmHg.  FINDINGS Left Ventricle: GLS -17.6. Left ventricular ejection fraction, by estimation, is 55 to 60%. The left ventricle has normal function. The left ventricle has no regional wall motion abnormalities. The left ventricular internal cavity size was normal in size. There is borderline left ventricular hypertrophy. Left ventricular diastolic parameters were normal.  Right Ventricle: The right ventricular size is normal. No increase in right ventricular wall thickness. Right ventricular systolic function is normal. There is normal pulmonary artery systolic pressure. The tricuspid regurgitant velocity is 2.07 m/s, and with an assumed right atrial pressure of 3 mmHg, the estimated right ventricular systolic pressure is 20.1 mmHg.  Left Atrium: Left atrial size was normal in size.  Right Atrium: Right atrial size was normal  in size.  Pericardium: There is no evidence of pericardial effusion.  Mitral Valve: The mitral valve is normal in structure. Trivial mitral valve regurgitation. No evidence of mitral valve stenosis.  Tricuspid Valve: The tricuspid valve is normal in structure. Tricuspid valve regurgitation is not demonstrated. No evidence of tricuspid stenosis.  Aortic Valve: The aortic valve is normal in structure. Aortic valve regurgitation is not visualized. No aortic stenosis is present.  Pulmonic Valve: The pulmonic valve was normal in structure. Pulmonic valve regurgitation is not visualized. No evidence of pulmonic stenosis.  Aorta: The aortic root is normal in size and structure.  Venous: The inferior vena cava is normal in size with greater than 50% respiratory variability, suggesting right atrial pressure of 3 mmHg.  IAS/Shunts: No atrial level shunt detected by color flow  Doppler.   LEFT VENTRICLE PLAX 2D LVIDd:         4.80 cm   Diastology LVIDs:         3.00 cm   LV e' medial:    9.17 cm/s LV PW:         1.10 cm   LV E/e' medial:  7.3 LV IVS:        1.10 cm   LV e' lateral:   8.08 cm/s LVOT diam:     2.10 cm   LV E/e' lateral: 8.3 LV SV:         79 LV SV Index:   41 LVOT Area:     3.46 cm   RIGHT VENTRICLE             IVC RV Basal diam:  2.90 cm     IVC diam: 1.60 cm RV Mid diam:    2.70 cm RV S prime:     13.10 cm/s TAPSE (M-mode): 2.4 cm  LEFT ATRIUM             Index        RIGHT ATRIUM           Index LA diam:        5.10 cm 2.65 cm/m   RA Area:     14.00 cm LA Vol (A2C):   48.4 ml 25.18 ml/m  RA Volume:   27.70 ml  14.41 ml/m LA Vol (A4C):   53.7 ml 27.93 ml/m LA Biplane Vol: 54.1 ml 28.14 ml/m AORTIC VALVE LVOT Vmax:   101.00 cm/s LVOT Vmean:  73.100 cm/s LVOT VTI:    0.229 m  AORTA Ao Root diam: 3.50 cm Ao Asc diam:  3.40 cm Ao Desc diam: 2.00 cm  MITRAL VALVE               TRICUSPID VALVE MV Area (PHT): 2.64 cm    TR Peak grad:   17.1 mmHg MV Decel Time: 287 msec    TR Vmax:        207.00 cm/s MV E velocity: 67.10 cm/s MV A velocity: 77.60 cm/s  SHUNTS MV E/A ratio:  0.86        Systemic VTI:  0.23 m Systemic Diam: 2.10 cm  Gypsy Balsam MD Electronically signed by Gypsy Balsam MD Signature Date/Time: 07/25/2022/11:23:35 AM    Final             Risk Assessment/Calculations:             Physical Exam:   VS:  BP 130/70 (BP Location: Left Arm, Patient Position: Sitting, Cuff Size: Normal)   Pulse 76   Ht 5\' 5"  (  1.651 m)   Wt 188 lb 9.6 oz (85.5 kg)   SpO2 96%   BMI 31.38 kg/m    Wt Readings from Last 3 Encounters:  12/24/22 188 lb 9.6 oz (85.5 kg)  12/17/22 183 lb (83 kg)  11/10/22 187 lb (84.8 kg)    GEN: Well nourished, well developed in no acute distress NECK: No JVD; No carotid bruits CARDIAC: RRR, no murmurs, rubs, gallops RESPIRATORY:  Clear to auscultation without rales, wheezing or  rhonchi  ABDOMEN: Soft, non-tender, non-distended EXTREMITIES:  No edema; No deformity   ASSESSMENT AND PLAN: .   Coronary artery disease- s/p DES x 1 to mid LAD with two LHC since showing non-obstructive CAD and patent LAD stent. Stable with no anginal symptoms. No indication for ischemic evaluation.  Continue aspirin 81 mg daily, continue Lipitor we will increase this to 80 mg daily, continue fenofibrate 145 mg daily, continue metoprolol 25 mg daily, continue nitroglycerin as needed--has not needed. Hyperlipidemia- most recent LDL was 87 on 11/10/2022, will increase his Lipitor to 80 mg daily as we would prefer his LDL be less than 70.  Repeat FLP and LFTs in 8 weeks. Hypertension-blood pressure today is controlled at 130/70, continue lisinopril 2.5 mg daily, continue metoprolol 25 mg daily. Palpitations-will arrange for 2-week monitor, recent lab work without contributing factors for palpitations.        Dispo: 2-week monitor for palpitations, increase Lipitor to 80 mg daily, repeat FLP and LFTs in 8 weeks.  Follow-up in 6 months.  Signed, Flossie Dibble, NP

## 2022-12-30 ENCOUNTER — Other Ambulatory Visit: Payer: Self-pay | Admitting: Family Medicine

## 2022-12-30 DIAGNOSIS — E785 Hyperlipidemia, unspecified: Secondary | ICD-10-CM

## 2023-01-12 DIAGNOSIS — I2510 Atherosclerotic heart disease of native coronary artery without angina pectoris: Secondary | ICD-10-CM | POA: Diagnosis not present

## 2023-01-12 DIAGNOSIS — R002 Palpitations: Secondary | ICD-10-CM | POA: Diagnosis not present

## 2023-01-13 ENCOUNTER — Telehealth: Payer: Self-pay

## 2023-01-13 NOTE — Progress Notes (Signed)
   01/13/2023  Patient ID: Mitchell Hammond, male   DOB: 03-06-1956, 68 y.o.   MRN: [id number]  Telephone follow-up regarding affordability of Jardiance  -Patient has not been able to pick up and start Jardiance 25mg  daily due to copay >$140 -He was previously taking Jardiance 10mg , and this was affordable for him -Prior authorization is not required for this medication, but it is a tier 6 drug on his formulary -Patient does NOT qualify for LIS based on HHI, but he does qualify for Jardiance PAP -Coordinating with medication assistance team to initiate application for Jardiance 25mg  daily through [facility]  Lenna Gilford, PharmD, DPLA

## 2023-01-16 ENCOUNTER — Encounter: Payer: Self-pay | Admitting: *Deleted

## 2023-01-16 ENCOUNTER — Telehealth: Payer: Self-pay | Admitting: *Deleted

## 2023-01-16 NOTE — Telephone Encounter (Signed)
-----   Message from Flossie Dibble sent at 01/16/2023 11:29 AM EDT ----- Monitor showed he is having times where his heart speeds up. This will not harm him but it may bother him. Lets increase his metoprolol to 50 mg daily and see if it helps.

## 2023-01-16 NOTE — Telephone Encounter (Signed)
Left message for pt to call back about monitor results.

## 2023-01-20 ENCOUNTER — Telehealth: Payer: Self-pay

## 2023-01-20 MED ORDER — METOPROLOL SUCCINATE ER 50 MG PO TB24: 50.0000 mg | ORAL_TABLET | Freq: Every day | ORAL | 3 refills | Status: DC

## 2023-01-20 NOTE — Telephone Encounter (Signed)
Spoke with patient and increased his Metoprolol to 50 mg per Jimmy Footman note and sent to [facility] in [place] per patient's request.

## 2023-01-20 NOTE — Telephone Encounter (Signed)
-----   Message from Lenna Gilford sent at 01/16/2023  1:31 PM EDT ----- Peri [facility] afternoon!  Mitchell Hammond would qualify for Jardiance 25mg  daily PAP through [facility], but he does not qualify for LIS based in [facility].  Could you all start the PAP application on his behalf, please?  Thank you!

## 2023-01-20 NOTE — Telephone Encounter (Signed)
PAP: PAP application for Jardiance, ([employer] has been mailed to pt's home address on file. Will fax provider portion of application to provider's office when pt's portion is received.    PLEASE BE ADVISED

## 2023-02-03 ENCOUNTER — Other Ambulatory Visit: Payer: Self-pay | Admitting: Family Medicine

## 2023-02-26 ENCOUNTER — Ambulatory Visit: Payer: PPO | Admitting: Family Medicine

## 2023-03-17 ENCOUNTER — Other Ambulatory Visit: Payer: Self-pay | Admitting: Family Medicine

## 2023-03-17 ENCOUNTER — Telehealth: Payer: Self-pay

## 2023-03-17 DIAGNOSIS — Z794 Long term (current) use of insulin: Secondary | ICD-10-CM

## 2023-03-17 NOTE — Telephone Encounter (Signed)
I left a message on the number(s) listed in the patients chart requesting the patient to call back regarding the upcomming appointment for 03/26/2023. The provider is out of the office that day. The appointment has been canceled. Waiting for the patient to return the call.  Per Dr. Sedalia Muta, this appointment can be rescheduled to 11/6 in the afternoon with Dr. Sedalia Muta.

## 2023-03-26 ENCOUNTER — Other Ambulatory Visit: Payer: Self-pay | Admitting: Family Medicine

## 2023-03-26 ENCOUNTER — Encounter: Payer: PPO | Admitting: Family Medicine

## 2023-03-26 DIAGNOSIS — E088 Diabetes mellitus due to underlying condition with unspecified complications: Secondary | ICD-10-CM

## 2023-03-27 ENCOUNTER — Ambulatory Visit: Payer: Self-pay | Admitting: Family Medicine

## 2023-03-27 DIAGNOSIS — E088 Diabetes mellitus due to underlying condition with unspecified complications: Secondary | ICD-10-CM

## 2023-03-27 NOTE — Telephone Encounter (Signed)
  Chief Complaint: Med Refill needed Symptoms: none Frequency: n/a Pertinent Negatives: Patient denies no symptoms Disposition: [] ED /[] Urgent Care (no appt availability in office) / [x] Appointment(In office/virtual)/ []  [place] [facility]/ [] Home Care/ [] Refused Recommended Disposition /[] [facility]/ []  Follow-up with PCP Additional Notes: Appt scheduled for 11/12 at 10am-patient missed previously scheduled appt due to being late, it was stated by patient that he was told he needs appt before refill can be sent. Requesting courtesy refill of Metformin, patient completely out. Patient requests call from office once refill sent in       Answer Assessment - Initial Assessment Questions 1. DRUG NAME: "What medicine do you need to have refilled?"     Metformin 2. REFILLS REMAINING: "How many refills are remaining?" (Note: The label on the medicine or pill bottle will show how many refills are remaining. If there are no refills remaining, then a renewal may be needed.)     none 3. EXPIRATION DATE: "What is the expiration date?" (Note: The label states when the prescription will expire, and thus can no longer be refilled.)     N/A 4. PRESCRIBING HCP: "Who prescribed it?" Reason: If prescribed by specialist, call should be referred to that group.     Dr Cox 5. SYMPTOMS: "Do you have any symptoms?"     No 6. PREGNANCY: "Is there any chance that you are pregnant?" "When was your last menstrual period?"     N/A  Protocols used: Medication Refill and Renewal Call-A-AH

## 2023-03-30 NOTE — Assessment & Plan Note (Signed)
Continue with Omeprazole 40 mg daily. 

## 2023-03-30 NOTE — Assessment & Plan Note (Signed)
Well controlled.  No changes to medicines. lipitor 40 mg daily, fenofibrate 145 mg daily Continue to work on eating a healthy diet and exercise.  Labs drawn today.   

## 2023-03-30 NOTE — Assessment & Plan Note (Signed)
Control: Uncotrolled Recommend check sugars fasting daily. Recommend check feet daily. Recommend annual eye exams. Medicines:  Metformin 1000 mg twice daily  and tresiba 45 units daily, Humulin sliding scale Continue to work on eating a healthy diet and exercise.  Labs drawn today.

## 2023-03-30 NOTE — Assessment & Plan Note (Signed)
Well controlled.  No medication changes recommended. currently on metoprolol 25 mg daily and lisinopril 2.5 mg daily. Continue healthy diet and exercise.   Labs drawn today

## 2023-03-30 NOTE — Progress Notes (Unsigned)
Subjective:  Patient ID: Mitchell Hammond, male    DOB: Mar 28, 1956  Age: 67 y.o. MRN: [id number]  No chief complaint on file.   HPI   Presents today for chronic follow up. Current cares for his wife who is on dialysis.  Hyperlipidemia: taking lipitor 40 mg daily, fenofibrate 145 mg daily GERD: taking omeprazole 40 mg daily Hypertensive heart disease:: currently on metoprolol 25 mg daily and lisinopril 2.5 mg daily, hx of heart disease and MI (has 2 stents and a balloon). See's Dr. Josiah Lobo.  Denies any history of congestive heart failure. 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 Unable to tolerate ozempic. Reports he has been diabetic approx 20 years. Has taken glipizide and januvia in the past. On lyrica 75 mg twice daily      11/10/2022   10:10 AM 06/24/2022    8:53 AM 12/25/2021    8:25 AM 07/24/2020    9:32 AM 07/20/2019    8:12 AM  Depression screen PHQ 2/9  Decreased Interest 0 0 0 2 0  Down, Depressed, Hopeless 0 0 0 1 0  PHQ - 2 Score 0 0 0 3 0  Altered sleeping 0  3 3   Tired, decreased energy 0  1 1   Change in appetite 0  0 0   Feeling bad or failure about yourself  0  0 0   Trouble concentrating 0  0 1   Moving slowly or fidgety/restless 0  0 0   Suicidal thoughts 0  0 0   PHQ-9 Score 0  4 8   Difficult doing work/chores Not difficult at all  Not difficult at all Somewhat difficult         11/10/2022   10:11 AM  Fall Risk   Falls in the past year? 0  Number falls in past yr: 0  Injury with Fall? 0  Risk for fall due to : No Fall Risks  Follow up Falls evaluation completed;Falls prevention discussed    Patient Care Team: Marianita Botkin, Fritzi Mandes, MD as PCP - General (Family Medicine) Revankar, Aundra Dubin, MD as PCP - Cardiology (Cardiology) Zettie Pho, [facility] (Inactive) (Pharmacist)   Review of Systems  Current Outpatient Medications on File Prior to Visit  Medication Sig  Dispense Refill   albuterol (VENTOLIN HFA) 108 (90 Base) MCG/ACT inhaler Inhale 2 puffs into the lungs every 6 (six) hours as needed for wheezing or shortness of breath. 8 g 3   aspirin EC 81 MG tablet Take 81 mg by mouth daily.     atorvastatin (LIPITOR) 80 MG tablet Take 1 tablet (80 mg total) by mouth daily. 90 tablet 3   cholecalciferol (VITAMIN D3) 25 MCG (1000 UNIT) tablet Take 1 tablet (1,000 Units total) by mouth daily. 30 tablet 1   Continuous Glucose Sensor (FREESTYLE LIBRE 14 DAY SENSOR) MISC USE AS DIRECTED EVERY  14  DAYS 3 each 6   empagliflozin (JARDIANCE) 25 MG TABS tablet Take 1 tablet (25 mg total) by mouth daily before breakfast. (Patient not taking: Reported on 01/13/2023) 90 tablet 0   fenofibrate (TRICOR) 145 MG tablet TAKE 1 TABLET BY MOUTH AT BEDTIME 90 tablet 0   Glucagon (GVOKE HYPOPEN 2-PACK) 1 MG/0.2ML SOAJ Inject 1 Dose into the skin as needed (hypoglycemia). 0.2 mL 1   indomethacin (INDOCIN SR) 75 MG CR capsule Take 1 capsule (75 mg total) by mouth 2 (two)  times daily with a meal. 14 capsule 0   insulin lispro (HUMALOG KWIKPEN) 200 UNIT/ML KwikPen Check sugars before meals Start 4 U prior to breakfast, lunch, and supper. + 1 u if sugar is greater than 200. + 2 u if sugar between 201-250 + 3 U if sugar between 251-300 + 4 U if sugar between 301-350 + 5 U if sugar between 351-400 + 6 U if sugar > 400. 15 mL 1   lisinopril (ZESTRIL) 2.5 MG tablet Take 1 tablet by mouth once daily 90 tablet 0   meloxicam (MOBIC) 15 MG tablet Take 1 tablet (15 mg total) by mouth daily. 90 tablet 1   metFORMIN (GLUCOPHAGE) 1000 MG tablet Take 1 tablet by mouth twice daily 180 tablet 0   metoprolol succinate (TOPROL-XL) 50 MG 24 hr tablet Take 1 tablet (50 mg total) by mouth daily. Take with or immediately following a meal. 90 tablet 3   mupirocin ointment (BACTROBAN) 2 % Apply topically 2 (two) times daily. 22 g 6   nitroGLYCERIN (NITROSTAT) 0.4 MG SL tablet DISSOLVE ONE TABLET UNDER THE TONGUE  EVERY 5 MINUTES AS NEEDED FOR CHEST PAIN.  DO NOT EXCEED A TOTAL OF 3 DOSES IN 15 MINUTES 25 tablet 0   omeprazole (PRILOSEC) 40 MG capsule Take 1 capsule (40 mg total) by mouth daily. 30 capsule 3   ondansetron (ZOFRAN) 4 MG tablet Take 1 tablet (4 mg total) by mouth every 8 (eight) hours as needed for nausea or vomiting. 20 tablet 0   ONETOUCH VERIO test strip 1 each by Other route daily. 100 each 2   pregabalin (LYRICA) 75 MG capsule Take 1 capsule (75 mg total) by mouth 2 (two) times daily. 60 capsule 2   Rimegepant Sulfate (NURTEC) 75 MG TBDP Take one tablet a day as needed 30 tablet 1   SHINGRIX injection      TRESIBA FLEXTOUCH 100 UNIT/ML FlexTouch Pen INJECT 45 UNITS SUBCUTANEOUSLY ONCE DAILY 45 mL 0   vitamin C (VITAMIN C) 500 MG tablet Take 1 tablet (500 mg total) by mouth 2 (two) times daily. 60 tablet 2   zolpidem (AMBIEN) 10 MG tablet Take 1 tablet (10 mg total) by mouth at bedtime as needed for sleep. 30 tablet 5   No current facility-administered medications on file prior to visit.   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   Diabetes mellitus due to underlying condition with unspecified complications (HCC) 12/26/2015   Diabetic polyneuropathy associated with type 2 diabetes mellitus (HCC) 08/20/2016   Discitis    Essential hypertension 12/26/2015   Fracture, tibial plateau 12/18/2014   Hypertension    IDDM (insulin dependent diabetes mellitus)    type 2   Metatarsalgia of both feet 08/20/2016   MI (myocardial infarction) (HCC)    x 2, Multi-link Vision 3.5 x 15 mm BMS in 2009 (mid LAD), balloon 2015   Mixed hyperlipidemia 06/14/2020   Obesity (BMI 30.0-34.9) 06/26/2020   Tibial plateau fracture 12/18/2014   Vitamin D  insufficiency    Past Surgical History:  Procedure Laterality Date   BACK SURGERY     2014   CARDIAC CATHETERIZATION     x 2, 2009 & 2015  COLONOSCOPY     CORONARY ANGIOPLASTY     stent - 2008, balloon - 2015   FASCIOTOMY Right 12/19/2014   Procedure: ANTERIOR COMPARTMENTAL FASCIOTOMY;  Surgeon: Myrene Galas, MD;  Location: [facility] OR;  Service: Orthopedics;  Laterality: Right;   HAND SURGERY Left    HARDWARE REMOVAL Right 07/24/2015   Procedure: HARDWARE REMOVAL RIGHT TIBIA;  Surgeon: Myrene Galas, MD;  Location: [facility] OR;  Service: Orthopedics;  Laterality: Right;   KNEE ARTHROSCOPY Right 07/24/2015   Procedure: RIGHT ARTHROSCOPY KNEE WITH PARTIAL SYNOVECTOMY AND MEDIAL MENISCECTOMY;  Surgeon: Myrene Galas, MD;  Location: [facility] OR;  Service: Orthopedics;  Laterality: Right;   LEFT HEART CATH AND CORONARY ANGIOGRAPHY N/A 07/02/2020   Procedure: LEFT HEART CATH AND CORONARY ANGIOGRAPHY;  Surgeon: Lennette Bihari, MD;  Location: [facility] INVASIVE [facility] LAB;  Service: Cardiovascular;  Laterality: N/A;   LEFT HEART CATH AND CORONARY ANGIOGRAPHY N/A 01/22/2022   Procedure: LEFT HEART CATH AND CORONARY ANGIOGRAPHY;  Surgeon: Iran Ouch, MD;  Location: [facility];  Service: Cardiovascular;  Laterality: N/A;   ORIF TIBIA PLATEAU Right 12/19/2014   Procedure: OPEN REDUCTION INTERNAL FIXATION (ORIF) RIGHT TIBIAL PLATEAU;  Surgeon: Myrene Galas, MD;  Location: [facility] OR;  Service: Orthopedics;  Laterality: Right;   TONSILLECTOMY     VASECTOMY      Family History  Problem Relation Age of Onset   Diabetes Mother    CAD Mother    Liver disease Father    Social History   Socioeconomic History   Marital status: Married    Spouse name: Not on file   Number of children: 2   Years of education: Not on file   Highest education level: Not on file  Occupational History   Occupation: disabled  Tobacco Use   Smoking status: Former    Current packs/day: 0.00    Types: Cigarettes    Quit date: 02/20/2007     Years since quitting: 16.1   Smokeless tobacco: Never  Vaping Use   Vaping status: Never Used  Substance and Sexual Activity   Alcohol use: No    Alcohol/week: 0.0 standard drinks of alcohol    Comment: None in years   Drug use: No   Sexual activity: Yes    Partners: Female  Other Topics Concern   Not on file  Social History Narrative   Pt lives with wife and daughter in [place].    Social Determinants of Health   Financial Resource Strain: Low Risk  (11/14/2021)   Overall Financial Resource Strain (CARDIA)    Difficulty of Paying Living Expenses: Not hard at all  Food Insecurity: No Food Insecurity (08/29/2020)   Hunger Vital Sign    Worried About Running Out of Food in the Last Year: Never true    Ran Out of Food in the Last Year: Never true  Transportation Needs: No Transportation Needs (11/14/2021)   PRAPARE - Administrator, Civil Service (Medical): No    Lack of Transportation (Non-Medical): No  Physical Activity: Inactive (12/17/2022)   Exercise Vital Sign    Days of Exercise per Week: 0 days    Minutes of Exercise per Session: 0 min  Stress: No Stress Concern Present (06/24/2022)   [employer] of Occupational Health - Occupational Stress Questionnaire    Feeling of Stress : Not at all  Social Connections: Moderately Integrated (06/24/2022)   Social Connection and Isolation Panel [NHANES]    Frequency of Communication with Friends and Family:  More than three times a week    Frequency of Social Gatherings with Friends and Family: More than three times a week    Attends Religious Services: More than 4 times per year    Active Member of Clubs or Organizations: No    Attends Banker Meetings: Never    Marital Status: Married    Objective:  There were no vitals taken for this visit.     12/24/2022    2:44 PM 12/17/2022    8:00 AM 11/10/2022    9:51 AM  BP/Weight  Systolic BP 130 132 116  Diastolic BP 70 68 78  Wt. (Lbs) 188.6 183 187  BMI  31.38 kg/m2 30.45 kg/m2 31.12 kg/m2    Physical Exam  Diabetic Foot Exam - Simple   No data filed      Lab Results  Component Value Date   WBC 5.5 11/10/2022   HGB 16.1 11/10/2022   HCT 48.1 11/10/2022   PLT 167 11/10/2022   GLUCOSE 144 (H) 11/10/2022   CHOL 148 11/10/2022   TRIG 118 11/10/2022   HDL 40 11/10/2022   LDLCALC 87 11/10/2022   ALT 25 11/10/2022   AST 27 11/10/2022   NA 140 11/10/2022   K 4.8 11/10/2022   CL 102 11/10/2022   CREATININE 1.09 11/10/2022   BUN 16 11/10/2022   CO2 22 11/10/2022   TSH 2.829 12/18/2014   INR 1.20 12/18/2014   HGBA1C 9.8 (H) 11/10/2022   MICROALBUR 80 06/14/2020      Assessment & Plan:    Hypertensive heart disease without heart failure Assessment & Plan: Well controlled.  No medication changes recommended. currently on metoprolol 25 mg daily and lisinopril 2.5 mg daily. Continue healthy diet and exercise.   Labs drawn today   Gastroesophageal reflux disease with esophagitis without hemorrhage Assessment & Plan: Continue with Omeprazole 40 mg daily.   Type 2 diabetes mellitus with hyperglycemia, with long-term current use of insulin (HCC) Assessment & Plan: Control: Uncotrolled Recommend check sugars fasting daily. Recommend check feet daily. Recommend annual eye exams. Medicines:  Metformin 1000 mg twice daily  and tresiba 45 units daily, Humulin sliding scale Continue to work on eating a healthy diet and exercise.  Labs drawn today.      Mixed hyperlipidemia Assessment & Plan: Well controlled.  No changes to medicines. lipitor 40 mg daily, fenofibrate 145 mg daily Continue to work on eating a healthy diet and exercise.  Labs drawn today.        No orders of the defined types were placed in this encounter.   No orders of the defined types were placed in this encounter.    Follow-up: No follow-ups on file.   I,[name] I Leal-Borjas,acting as a scribe for Blane Ohara, MD.,have documented all relevant  documentation on the behalf of Blane Ohara, MD,as directed by  Blane Ohara, MD while in the presence of Blane Ohara, MD.   An After Visit Summary was printed and given to the patient.  Blane Ohara, MD [facility] [phone]

## 2023-03-31 ENCOUNTER — Ambulatory Visit (INDEPENDENT_AMBULATORY_CARE_PROVIDER_SITE_OTHER): Payer: PPO | Admitting: Family Medicine

## 2023-03-31 ENCOUNTER — Encounter: Payer: Self-pay | Admitting: Family Medicine

## 2023-03-31 VITALS — BP 122/60 | HR 78 | Temp 97.8°F | Ht 65.0 in | Wt 192.0 lb

## 2023-03-31 DIAGNOSIS — B351 Tinea unguium: Secondary | ICD-10-CM

## 2023-03-31 DIAGNOSIS — Z794 Long term (current) use of insulin: Secondary | ICD-10-CM | POA: Diagnosis not present

## 2023-03-31 DIAGNOSIS — E088 Diabetes mellitus due to underlying condition with unspecified complications: Secondary | ICD-10-CM

## 2023-03-31 DIAGNOSIS — Z23 Encounter for immunization: Secondary | ICD-10-CM | POA: Diagnosis not present

## 2023-03-31 DIAGNOSIS — K2100 Gastro-esophageal reflux disease with esophagitis, without bleeding: Secondary | ICD-10-CM

## 2023-03-31 DIAGNOSIS — Z0000 Encounter for general adult medical examination without abnormal findings: Secondary | ICD-10-CM | POA: Diagnosis not present

## 2023-03-31 DIAGNOSIS — E1165 Type 2 diabetes mellitus with hyperglycemia: Secondary | ICD-10-CM | POA: Diagnosis not present

## 2023-03-31 DIAGNOSIS — I119 Hypertensive heart disease without heart failure: Secondary | ICD-10-CM | POA: Diagnosis not present

## 2023-03-31 DIAGNOSIS — E782 Mixed hyperlipidemia: Secondary | ICD-10-CM | POA: Diagnosis not present

## 2023-03-31 DIAGNOSIS — S0300XD Dislocation of jaw, unspecified side, subsequent encounter: Secondary | ICD-10-CM

## 2023-03-31 MED ORDER — METFORMIN HCL 1000 MG PO TABS: 1000.0000 mg | ORAL_TABLET | Freq: Two times a day (BID) | ORAL | 0 refills | Status: DC

## 2023-03-31 MED ORDER — METHOCARBAMOL 750 MG PO TABS: 750.0000 mg | ORAL_TABLET | Freq: Four times a day (QID) | ORAL | 2 refills | Status: DC | PRN

## 2023-03-31 NOTE — Patient Instructions (Addendum)
Start on methocarbamol 750 mg four times a day as needed TMJ.   Referral to podiatry.   Diabetes: I will work on ordering omnipods.

## 2023-03-31 NOTE — Progress Notes (Unsigned)
Subjective:  Patient ID: Mitchell Hammond, male    DOB: 1955/06/20  Age: 67 y.o. MRN: [id number]  Chief Complaint  Patient presents with   Medical Management of Chronic Issues    HPI   Hyperlipidemia: taking lipitor 40 mg daily, fenofibrate 145 mg daily  GERD: taking omeprazole 40 mg daily  Hypertensive heart disease:: currently on metoprolol 25 mg daily and lisinopril 2.5 mg daily, hx of heart disease and MI (has 2 stents and a balloon). See's Dr. Josiah Lobo.  Denies any history of congestive heart failure.  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 Unable to tolerate ozempic. Reports he has been diabetic approx 20 years. On lyrica 75 mg twice daily. Last eye exam 03/2023 at [facility] here in [place].  Complaining of continued left jaw pain. It aches. It throbs. Causes headaches. Worked up for cluster headaches over the summer. Patient has seen ENT and the dentist. Nothing has helped. They do not have any other suggestions. Nonsteroid antiinflammatories, such as naproxen (aleve) or ibuprofen (advil.) have not helped. Sometimes at night will wake up from the pain.     03/31/2023    9:22 AM 11/10/2022   10:10 AM 06/24/2022    8:53 AM 12/25/2021    8:25 AM 07/24/2020    9:32 AM  Depression screen PHQ 2/9  Decreased Interest 0 0 0 0 2  Down, Depressed, Hopeless 0 0 0 0 1  PHQ - 2 Score 0 0 0 0 3  Altered sleeping 2 0  3 3  Tired, decreased energy 1 0  1 1  Change in appetite 0 0  0 0  Feeling bad or failure about yourself  0 0  0 0  Trouble concentrating 0 0  0 1  Moving slowly or fidgety/restless 0 0  0 0  Suicidal thoughts 0 0  0 0  PHQ-9 Score 3 0  4 8  Difficult doing work/chores Not difficult at all Not difficult at all  Not difficult at all Somewhat difficult        03/31/2023    9:22 AM  Fall Risk    Falls in the past year? 0  Number falls in past yr: 0  Injury with Fall? 0  Risk for fall due to : No Fall Risks  Follow up Falls evaluation completed    Patient Care Team: Blane Ohara, MD as PCP - General (Family Medicine) Revankar, Aundra Dubin, MD as PCP - Cardiology (Cardiology) Zettie Pho, [facility] (Inactive) (Pharmacist)   Review of Systems  Constitutional:  Negative for chills and fever.  HENT:  Negative for congestion, rhinorrhea and sore throat.   Respiratory:  Negative for cough and shortness of breath.   Cardiovascular:  Negative for chest pain and palpitations.  Gastrointestinal:  Negative for abdominal pain, constipation, diarrhea, nausea and vomiting.  Genitourinary:  Negative for dysuria and urgency.  Musculoskeletal:  Negative for arthralgias, back pain and myalgias.  Neurological:  Positive for headaches. Negative for dizziness.  Psychiatric/Behavioral:  Negative for dysphoric mood. The patient is not nervous/anxious.     Current Outpatient Medications on File Prior to Visit  Medication Sig Dispense Refill   albuterol (VENTOLIN HFA) 108 (90 Base) MCG/ACT inhaler Inhale 2 puffs into the lungs every 6 (six) hours as needed for wheezing or shortness of breath.  8 g 3   aspirin EC 81 MG tablet Take 81 mg by mouth daily.     atorvastatin (LIPITOR) 80 MG tablet Take 1 tablet (80 mg total) by mouth daily. 90 tablet 3   cholecalciferol (VITAMIN D3) 25 MCG (1000 UNIT) tablet Take 1 tablet (1,000 Units total) by mouth daily. 30 tablet 1   Continuous Glucose Sensor (FREESTYLE LIBRE 14 DAY SENSOR) MISC USE AS DIRECTED EVERY  14  DAYS 3 each 6   fenofibrate (TRICOR) 145 MG tablet TAKE 1 TABLET BY MOUTH AT BEDTIME 90 tablet 0   Glucagon (GVOKE HYPOPEN 2-PACK) 1 MG/0.2ML SOAJ Inject 1 Dose into the skin as needed (hypoglycemia). 0.2 mL 1   lisinopril (ZESTRIL) 2.5 MG tablet Take 1 tablet by mouth once daily 90 tablet 0   meloxicam (MOBIC) 15 MG tablet Take 1 tablet (15 mg total) by  mouth daily. 90 tablet 1   metoprolol succinate (TOPROL-XL) 50 MG 24 hr tablet Take 1 tablet (50 mg total) by mouth daily. Take with or immediately following a meal. 90 tablet 3   mupirocin ointment (BACTROBAN) 2 % Apply topically 2 (two) times daily. 22 g 6   nitroGLYCERIN (NITROSTAT) 0.4 MG SL tablet DISSOLVE ONE TABLET UNDER THE TONGUE EVERY 5 MINUTES AS NEEDED FOR CHEST PAIN.  DO NOT EXCEED A TOTAL OF 3 DOSES IN 15 MINUTES 25 tablet 0   omeprazole (PRILOSEC) 40 MG capsule Take 1 capsule (40 mg total) by mouth daily. 30 capsule 3   ondansetron (ZOFRAN) 4 MG tablet Take 1 tablet (4 mg total) by mouth every 8 (eight) hours as needed for nausea or vomiting. 20 tablet 0   ONETOUCH VERIO test strip 1 each by Other route daily. 100 each 2   pregabalin (LYRICA) 75 MG capsule Take 1 capsule (75 mg total) by mouth 2 (two) times daily. 60 capsule 2   Rimegepant Sulfate (NURTEC) 75 MG TBDP Take one tablet a day as needed 30 tablet 1   SHINGRIX injection      TRESIBA FLEXTOUCH 100 UNIT/ML FlexTouch Pen INJECT 45 UNITS SUBCUTANEOUSLY ONCE DAILY 45 mL 0   vitamin C (VITAMIN C) 500 MG tablet Take 1 tablet (500 mg total) by mouth 2 (two) times daily. 60 tablet 2   zolpidem (AMBIEN) 10 MG tablet Take 1 tablet (10 mg total) by mouth at bedtime as needed for sleep. 30 tablet 5   No current facility-administered medications on file prior to visit.   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   Diabetes mellitus due to underlying condition with unspecified complications (HCC) 12/26/2015   Diabetic polyneuropathy associated with type 2 diabetes mellitus (HCC) 08/20/2016   Discitis    Essential hypertension 12/26/2015   Fracture, tibial plateau 12/18/2014    Hypertension    IDDM (insulin dependent diabetes mellitus)    type 2   Metatarsalgia of both feet 08/20/2016   MI (myocardial infarction) (HCC)    x 2, Multi-link Vision 3.5 x 15 mm BMS in 2009 (mid LAD), balloon 2015   Mixed hyperlipidemia 06/14/2020   Obesity (BMI 30.0-34.9) 06/26/2020   Tibial plateau fracture 12/18/2014   Vitamin D insufficiency    Past Surgical History:  Procedure  Laterality Date   BACK SURGERY     2014   CARDIAC CATHETERIZATION     x 2, 2009 & 2015   COLONOSCOPY     CORONARY ANGIOPLASTY     stent - 2008, balloon - 2015   FASCIOTOMY Right 12/19/2014   Procedure: ANTERIOR COMPARTMENTAL FASCIOTOMY;  Surgeon: Myrene Galas, MD;  [facility];  Service: Orthopedics;  Laterality: Right;   HAND SURGERY Left    HARDWARE REMOVAL Right 07/24/2015   Procedure: HARDWARE REMOVAL RIGHT TIBIA;  Surgeon: Myrene Galas, MD;  Location: [facility] OR;  Service: Orthopedics;  Laterality: Right;   KNEE ARTHROSCOPY Right 07/24/2015   Procedure: RIGHT ARTHROSCOPY KNEE WITH PARTIAL SYNOVECTOMY AND MEDIAL MENISCECTOMY;  Surgeon: Myrene Galas, MD;  Location: [facility] OR;  Service: Orthopedics;  Laterality: Right;   LEFT HEART CATH AND CORONARY ANGIOGRAPHY N/A 07/02/2020   Procedure: LEFT HEART CATH AND CORONARY ANGIOGRAPHY;  Surgeon: Lennette Bihari, MD;  Location: [facility] INVASIVE CV LAB;  Service: Cardiovascular;  Laterality: N/A;   LEFT HEART CATH AND CORONARY ANGIOGRAPHY N/A 01/22/2022   Procedure: LEFT HEART CATH AND CORONARY ANGIOGRAPHY;  Surgeon: Iran Ouch, MD;  Location: [facility] INVASIVE CV LAB;  Service: Cardiovascular;  Laterality: N/A;   ORIF TIBIA PLATEAU Right 12/19/2014   Procedure: OPEN REDUCTION INTERNAL FIXATION (ORIF) RIGHT TIBIAL PLATEAU;  Surgeon: Myrene Galas, MD;  Location: [facility] OR;  Service: Orthopedics;  Laterality: Right;   TONSILLECTOMY     VASECTOMY      Family History  Problem Relation Age of Onset   Diabetes Mother    CAD Mother    Liver disease Father    Social History    Socioeconomic History   Marital status: Married    Spouse name: Not on file   Number of children: 2   Years of education: Not on file   Highest education level: Not on file  Occupational History   Occupation: disabled  Tobacco Use   Smoking status: Former    Current packs/day: 0.00    Types: Cigarettes    Quit date: 02/20/2007    Years since quitting: 16.1   Smokeless tobacco: Never  Vaping Use   Vaping status: Never Used  Substance and Sexual Activity   Alcohol use: No    Alcohol/week: 0.0 standard drinks of alcohol    Comment: None in years   Drug use: No   Sexual activity: Yes    Partners: Female  Other Topics Concern   Not on file  Social History Narrative   Pt lives with wife and daughter in [place].    Social Determinants of Health   Financial Resource Strain: Low Risk  (11/14/2021)   Overall Financial Resource Strain (CARDIA)    Difficulty of Paying Living Expenses: Not hard at all  Food Insecurity: No Food Insecurity (08/29/2020)   Hunger Vital Sign    Worried About Running Out of Food in the Last Year: Never true    Ran Out of Food in the Last Year: Never true  Transportation Needs: No Transportation Needs (11/14/2021)   PRAPARE - Administrator, Civil Service (Medical): No    Lack of Transportation (Non-Medical): No  Physical Activity: Inactive (12/17/2022)   Exercise Vital Sign    Days of Exercise per Week: 0 days    Minutes of Exercise per Session: 0 min  Stress: No Stress Concern Present (06/24/2022)   [employer] of Occupational Health - Occupational Stress Questionnaire    Feeling of Stress : Not  at all  Social Connections: Moderately Integrated (06/24/2022)   Social Connection and Isolation Panel [NHANES]    Frequency of Communication with Friends and Family: More than three times a week    Frequency of Social Gatherings with Friends and Family: More than three times a week    Attends Religious Services: More than 4 times per year     Active Member of [employer] or Organizations: No    Attends Engineer, structural: Never    Marital Status: Married    Objective:  BP 122/60   Pulse 78   Temp 97.8 F (36.6 C)   Ht 5\' 5"  (1.651 m)   Wt 192 lb (87.1 kg)   SpO2 98%   BMI 31.95 kg/m      04/01/2023    3:37 PM 03/31/2023    9:19 AM 12/24/2022    2:44 PM  BP/Weight  Systolic BP 134 122 130  Diastolic BP 64 60 70  Wt. (Lbs) 195 192 188.6  BMI 32.45 kg/m2 31.95 kg/m2 31.38 kg/m2    Physical Exam Vitals reviewed.  Constitutional:      Appearance: Normal appearance. He is obese.  HENT:     Mouth/Throat:     Comments: Left TMJ clicks.  Neck:     Vascular: No carotid bruit.  Cardiovascular:     Rate and Rhythm: Normal rate and regular rhythm.     Pulses: Normal pulses.     Heart sounds: Normal heart sounds.  Pulmonary:     Effort: Pulmonary effort is normal. No respiratory distress.     Breath sounds: Normal breath sounds.  Abdominal:     General: Bowel sounds are normal.     Palpations: Abdomen is soft.     Tenderness: There is no abdominal tenderness.  Neurological:     Mental Status: He is alert and oriented to person, place, and time.  Psychiatric:        Mood and Affect: Mood normal.        Behavior: Behavior normal.     Diabetic Foot Exam - Simple   Simple Foot Form Diabetic Foot exam was performed with the following findings: Yes 03/31/2023  8:39 PM  Visual Inspection See comments: Yes Sensation Testing Intact to touch and monofilament testing bilaterally: Yes Pulse Check Posterior Tibialis and Dorsalis pulse intact bilaterally: Yes Comments Thickened nails.  Dry skin.        Lab Results  Component Value Date   WBC 6.1 03/31/2023   HGB 14.2 03/31/2023   HCT 43.0 03/31/2023   PLT 178 03/31/2023   GLUCOSE 141 (H) 03/31/2023   CHOL 124 03/31/2023   TRIG 90 03/31/2023   HDL 35 (L) 03/31/2023   LDLCALC 72 03/31/2023   ALT 23 03/31/2023   AST 21 03/31/2023   NA 142  03/31/2023   K 4.6 03/31/2023   CL 105 03/31/2023   CREATININE 0.98 03/31/2023   BUN 20 03/31/2023   CO2 23 03/31/2023   TSH 2.829 12/18/2014   INR 1.20 12/18/2014   HGBA1C 8.9 (H) 03/31/2023   MICROALBUR 80 06/14/2020      Assessment & Plan:    Hypertensive heart disease without heart failure Assessment & Plan: Well controlled.  No medication changes recommended. currently on metoprolol 25 mg daily and lisinopril 2.5 mg daily. Continue healthy diet and exercise.   Labs drawn today  Orders: -     CBC with Differential/Platelet -     Comprehensive metabolic panel  Gastroesophageal reflux disease  with esophagitis without hemorrhage Assessment & Plan: Continue with Omeprazole 40 mg daily.   Type 2 diabetes mellitus with hyperglycemia, with long-term current use of insulin (HCC) Assessment & Plan: Control: Uncontrolled Recommend check sugars fasting daily. Recommend check feet daily. Recommend annual eye exams. Medicines:  Metformin 1000 mg twice daily  and tresiba 45 units daily, Humulin sliding scale recommended.  I discussed omnipod and patient has agreed to try this.  Continue to work on eating a healthy diet and exercise.  Labs drawn today.     Orders: -     Hemoglobin A1c -     metFORMIN HCl; Take 1 tablet (1,000 mg total) by mouth 2 (two) times daily.  Dispense: 180 tablet; Refill: 0 -     Insulin Lispro; Check sugars before meals Start 4 U prior to breakfast, lunch, and supper. + 1 u if sugar is greater than 200. + 2 u if sugar between 201-250 + 3 U if sugar between 251-300 + 4 U if sugar between 301-350 + 5 U if sugar between 351-400 + 6 U if sugar > 400.  Dispense: 10 mL; Refill: 2  Mixed hyperlipidemia Assessment & Plan: Well controlled.  No changes to medicines. lipitor 40 mg daily, fenofibrate 145 mg daily Continue to work on eating a healthy diet and exercise.  Labs drawn today.    Orders: -     Lipid panel  Encounter for Medicare annual wellness  exam Assessment & Plan: Education given.   Encounter for immunization -     Flu Vaccine Trivalent High Dose (Fluad) -     Pfizer Comirnaty Covid-19 Vaccine 47yrs & older  Onychomycosis Assessment & Plan: Refer to podiatry.  Orders: -     Ambulatory referral to Podiatry  Dislocation of temporomandibular joint, subsequent encounter Assessment & Plan: Start on methocarbamol 750 mg four times a day as needed TMJ.  Exercises given.  Orders: -     Methocarbamol; Take 1 tablet (750 mg total) by mouth every 6 (six) hours as needed (TMJ).  Dispense: 120 tablet; Refill: 2    Meds ordered this encounter  Medications   metFORMIN (GLUCOPHAGE) 1000 MG tablet    Sig: Take 1 tablet (1,000 mg total) by mouth 2 (two) times daily.    Dispense:  180 tablet    Refill:  0   methocarbamol (ROBAXIN) 750 MG tablet    Sig: Take 1 tablet (750 mg total) by mouth every 6 (six) hours as needed (TMJ).    Dispense:  120 tablet    Refill:  2   insulin lispro (HUMALOG) 100 UNIT/ML injection    Sig: Check sugars before meals Start 4 U prior to breakfast, lunch, and supper. + 1 u if sugar is greater than 200. + 2 u if sugar between 201-250 + 3 U if sugar between 251-300 + 4 U if sugar between 301-350 + 5 U if sugar between 351-400 + 6 U if sugar > 400.    Dispense:  10 mL    Refill:  2    Orders Placed This Encounter  Procedures   Flu Vaccine Trivalent High Dose (Fluad)   Pfizer Comirnaty Covid-19 Vaccine 26yrs & older   CBC with Differential/Platelet   Comprehensive metabolic panel   Hemoglobin A1c   Lipid panel   Ambulatory referral to Podiatry     Follow-up: Return in 3 months (on 07/01/2023) for chronic follow up.   Clayborn Bigness I Leal-Borjas,acting as a scribe for Blane Ohara, MD.,have documented  all relevant documentation on the behalf of Blane Ohara, MD,as directed by  Blane Ohara, MD while in the presence of Blane Ohara, MD.   An After Visit Summary was printed and given to the patient.  I  attest that I have reviewed this visit and agree with the plan scribed by my staff.   Blane Ohara, MD [facility] [phone]

## 2023-04-01 ENCOUNTER — Ambulatory Visit: Payer: Self-pay | Admitting: Family Medicine

## 2023-04-01 ENCOUNTER — Encounter: Payer: Self-pay | Admitting: Family Medicine

## 2023-04-01 ENCOUNTER — Ambulatory Visit (INDEPENDENT_AMBULATORY_CARE_PROVIDER_SITE_OTHER): Payer: PPO | Admitting: Family Medicine

## 2023-04-01 ENCOUNTER — Other Ambulatory Visit: Payer: Self-pay | Admitting: Family Medicine

## 2023-04-01 VITALS — BP 134/64 | HR 70 | Temp 97.3°F | Ht 65.0 in | Wt 195.0 lb

## 2023-04-01 DIAGNOSIS — Z23 Encounter for immunization: Secondary | ICD-10-CM | POA: Insufficient documentation

## 2023-04-01 DIAGNOSIS — E088 Diabetes mellitus due to underlying condition with unspecified complications: Secondary | ICD-10-CM | POA: Insufficient documentation

## 2023-04-01 DIAGNOSIS — W450XXA Nail entering through skin, initial encounter: Secondary | ICD-10-CM

## 2023-04-01 DIAGNOSIS — Z0000 Encounter for general adult medical examination without abnormal findings: Secondary | ICD-10-CM | POA: Insufficient documentation

## 2023-04-01 DIAGNOSIS — B351 Tinea unguium: Secondary | ICD-10-CM | POA: Insufficient documentation

## 2023-04-01 DIAGNOSIS — S91339A Puncture wound without foreign body, unspecified foot, initial encounter: Secondary | ICD-10-CM

## 2023-04-01 DIAGNOSIS — S0300XA Dislocation of jaw, unspecified side, initial encounter: Secondary | ICD-10-CM | POA: Insufficient documentation

## 2023-04-01 DIAGNOSIS — S91342A Puncture wound with foreign body, left foot, initial encounter: Secondary | ICD-10-CM | POA: Diagnosis not present

## 2023-04-01 DIAGNOSIS — S91302A Unspecified open wound, left foot, initial encounter: Secondary | ICD-10-CM | POA: Diagnosis not present

## 2023-04-01 LAB — COMPREHENSIVE METABOLIC PANEL
ALT: 23 [IU]/L (ref 0–44)
AST: 21 [IU]/L (ref 0–40)
Albumin: 4.2 g/dL (ref 3.9–4.9)
Alkaline Phosphatase: 63 [IU]/L (ref 44–121)
BUN/Creatinine Ratio: 20 (ref 10–24)
BUN: 20 mg/dL (ref 8–27)
Bilirubin Total: 0.7 mg/dL (ref 0.0–1.2)
CO2: 23 mmol/L (ref 20–29)
Calcium: 9.3 mg/dL (ref 8.6–10.2)
Chloride: 105 mmol/L (ref 96–106)
Creatinine, Ser: 0.98 mg/dL (ref 0.76–1.27)
Globulin, Total: 1.9 g/dL (ref 1.5–4.5)
Glucose: 141 mg/dL — ABNORMAL HIGH (ref 70–99)
Potassium: 4.6 mmol/L (ref 3.5–5.2)
Sodium: 142 mmol/L (ref 134–144)
Total Protein: 6.1 g/dL (ref 6.0–8.5)
eGFR: 83 mL/min/{1.73_m2} (ref >59)

## 2023-04-01 LAB — HEMOGLOBIN A1C
Est. average glucose Bld gHb Est-mCnc: 209 mg/dL
Hgb A1c MFr Bld: 8.9 % — ABNORMAL HIGH (ref 4.8–5.6)

## 2023-04-01 LAB — LIPID PANEL
Chol/HDL Ratio: 3.5 ratio (ref 0.0–5.0)
Cholesterol, Total: 124 mg/dL (ref 100–199)
HDL: 35 mg/dL — ABNORMAL LOW (ref >39)
LDL Chol Calc (NIH): 72 mg/dL (ref 0–99)
Triglycerides: 90 mg/dL (ref 0–149)
VLDL Cholesterol Cal: 17 mg/dL (ref 5–40)

## 2023-04-01 LAB — CBC WITH DIFFERENTIAL/PLATELET
Basophils Absolute: 0.1 10*3/uL (ref 0.0–0.2)
Basos: 1 %
EOS (ABSOLUTE): 0.2 10*3/uL (ref 0.0–0.4)
Eos: 3 %
Hematocrit: 43 % (ref 37.5–51.0)
Hemoglobin: 14.2 g/dL (ref 13.0–17.7)
Immature Grans (Abs): 0 10*3/uL (ref 0.0–0.1)
Immature Granulocytes: 0 %
Lymphocytes Absolute: 1.2 10*3/uL (ref 0.7–3.1)
Lymphs: 19 %
MCH: 29.1 pg (ref 26.6–33.0)
MCHC: 33 g/dL (ref 31.5–35.7)
MCV: 88 fL (ref 79–97)
Monocytes Absolute: 0.4 10*3/uL (ref 0.1–0.9)
Monocytes: 7 %
Neutrophils Absolute: 4.2 10*3/uL (ref 1.4–7.0)
Neutrophils: 70 %
Platelets: 178 10*3/uL (ref 150–450)
RBC: 4.88 x10E6/uL (ref 4.14–5.80)
RDW: 13.1 % (ref 11.6–15.4)
WBC: 6.1 10*3/uL (ref 3.4–10.8)

## 2023-04-01 MED ORDER — OMNIPOD DASH PODS (GEN 4) MISC: 1.0000 | 2 refills | Status: DC

## 2023-04-01 MED ORDER — OMNIPOD DASH PDM (GEN 4) KIT: 1.0000 | PACK | 0 refills | Status: DC

## 2023-04-01 MED ORDER — CIPROFLOXACIN HCL 500 MG PO TABS: 500.0000 mg | ORAL_TABLET | Freq: Two times a day (BID) | ORAL | 0 refills | Status: AC

## 2023-04-01 MED ORDER — INSULIN LISPRO 100 UNIT/ML IJ SOLN: INTRAMUSCULAR | 2 refills | Status: DC

## 2023-04-01 NOTE — Assessment & Plan Note (Signed)
Patient received COVID and flu vaccines yesterday, but not the planned tetanus vaccine. -Administer tetanus vaccine today.

## 2023-04-01 NOTE — Assessment & Plan Note (Signed)
Patient stepped on a rusty nail, causing a puncture wound on the left foot. The wound is painful, throbbing, warm, and bleeding. The patient reports feeling as though the nail hit something hard, possibly bone. -Order foot X-ray to rule out bone involvement. -Administer tetanus shot today. -Prescribe antibiotics to cover Staphylococcus aureus and Pseudomonas (common pathogens in puncture wounds).Cipro 500 mg.po TWICE A DAY for 7 days -Advise patient to monitor for signs of infection (increased pain, redness, swelling, or pus).

## 2023-04-01 NOTE — Assessment & Plan Note (Signed)
Refer to podiatry

## 2023-04-01 NOTE — Telephone Encounter (Addendum)
Copied from CRM [id number]. Topic: Clinical - Medical Advice >> Apr 01, 2023  2:17 PM [place]well Nickel wrote: Reason for [id number] wants info about visit yesterday  Chief Complaint:  Stepped on a nail. Nail puncture  under the bottom   Left foot  right below the second toe to the third toe.  Puncture wound went in pretty hard. Symptoms:  Warm to touch - Limping  Disposition: [] ED /[] Urgent Care (no appt availability in office) / [x] Appointment(In office/virtual)/ []  [facility]/ [] Home Care/ [] Refused Recommended Disposition /[] [facility]/ []  Follow-up with PCP Additional Notes:  Nail was extremly rusty.  Patient is a diabetic.  Reason for Disposition  [1] Last tetanus shot > 5 years ago AND [2] DIRTY cut or scrape  Answer Assessment - Initial Assessment Questions 1. MECHANISM: "How did the injury happen?" (e.g., twisting injury, direct blow)       Stepped  on  stepped on a nail and it punctured left foot 2. ONSET: "When did the injury happen?" (Minutes or hours ago)      30 minutes ago  3. LOCATION: "Where is the injury located?"       4. APPEARANCE of INJURY: "What does the injury look like?"       Hot and 1 inch wound. 5. WEIGHT-BEARING: "Can you put weight on that foot?" "Can you walk (four steps or more)?"       No only on his heel. It hurst tthe front of the foot. 6. SIZE: For cuts, bruises, or swelling, ask: "How large is it?" 1 inch 2 1/2 inches 1/8 inch in diameter.  (e.g., inches or centimeters;  entire joint)       7. PAIN: "Is there pain?" If Yes, ask: "How bad is the pain?"   Pain is Moderate 6     - MODERATE (4-7): interferes with normal activities (e.g., work or school) or awakens from sleep, limping.           8. TETANUS: For any breaks in the skin, ask: "When was the last tetanus booster?" Thinks he got his tetanus  on yesterday      9. OTHER SYMPTOMS: "Do you have any other symptoms?"        Foot is warm to touch  Protocols used: Ankle and Foot  Injury-A-AH

## 2023-04-01 NOTE — Assessment & Plan Note (Signed)
Start on methocarbamol 750 mg four times a day as needed TMJ.  Exercises given.

## 2023-04-01 NOTE — Progress Notes (Signed)
Acute Office Visit  Subjective:    Patient ID: Mitchell Hammond, male    DOB: 01-29-1956, 67 y.o.   MRN: [id number]  Chief Complaint  Patient presents with   Foot Injury    HPI: States foot is hot and throbbing.The patient presents with a foot puncture wound sustained a couple of hours prior to the visit. They stepped on a 'extremely rusty' nail that penetrated their Croc and foot. The nail 'went till it hit something,' suggesting it may have reached bone. The patient had to use their other foot to dislodge the nail from the board it was attached to. The wound is located on the bottom of the foot, towards the tip. It is bleeding, warm, red, and throbbing, and the patient reports pain when walking and moving the foot. The patient's shoe is feeling tighter, suggesting possible swelling. The patient's daughter cleaned the wound prior to the visit. Not utd with tetanus.   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   Diabetes mellitus due to underlying condition with unspecified complications (HCC) 12/26/2015   Diabetic polyneuropathy associated with type 2 diabetes mellitus (HCC) 08/20/2016   Discitis    Essential hypertension 12/26/2015   Fracture, tibial plateau 12/18/2014   Hypertension    IDDM (insulin dependent diabetes mellitus)    type 2   Metatarsalgia of both feet 08/20/2016   MI (myocardial infarction) (HCC)    x 2, Multi-link Vision 3.5 x 15 mm BMS in 2009 (mid LAD), balloon 2015   Mixed hyperlipidemia 06/14/2020   Obesity (BMI 30.0-34.9) 06/26/2020   Tibial plateau fracture 12/18/2014   Vitamin D insufficiency     Past Surgical History:  Procedure Laterality Date   BACK SURGERY     2014   CARDIAC CATHETERIZATION     x 2, 2009  & 2015   COLONOSCOPY     CORONARY ANGIOPLASTY     stent - 2008, balloon - 2015   FASCIOTOMY Right 12/19/2014   Procedure: ANTERIOR COMPARTMENTAL FASCIOTOMY;  Surgeon: Myrene Galas, MD;  Location: [facility] OR;  Service: Orthopedics;  Laterality: Right;   HAND SURGERY Left    HARDWARE REMOVAL Right 07/24/2015   Procedure: HARDWARE REMOVAL RIGHT TIBIA;  Surgeon: Myrene Galas, MD;  Location: [facility] OR;  Service: Orthopedics;  Laterality: Right;   KNEE ARTHROSCOPY Right 07/24/2015   Procedure: RIGHT ARTHROSCOPY KNEE WITH PARTIAL SYNOVECTOMY AND MEDIAL MENISCECTOMY;  Surgeon: Myrene Galas, MD;  Location: [facility] OR;  Service: Orthopedics;  Laterality: Right;   LEFT HEART CATH AND CORONARY ANGIOGRAPHY N/A 07/02/2020   Procedure: LEFT HEART CATH AND CORONARY ANGIOGRAPHY;  Surgeon: Lennette Bihari, MD;  Location: [facility] INVASIVE CV LAB;  Service: Cardiovascular;  Laterality: N/A;   LEFT HEART CATH AND CORONARY ANGIOGRAPHY N/A 01/22/2022   Procedure: LEFT HEART CATH AND CORONARY ANGIOGRAPHY;  Surgeon: Iran Ouch, MD;  Location: [facility] INVASIVE CV LAB;  Service: Cardiovascular;  Laterality: N/A;   ORIF TIBIA PLATEAU Right 12/19/2014   Procedure: OPEN REDUCTION INTERNAL FIXATION (ORIF) RIGHT TIBIAL PLATEAU;  Surgeon: Myrene Galas, MD;  Location: [facility] OR;  Service: Orthopedics;  Laterality: Right;   TONSILLECTOMY     VASECTOMY      Family History  Problem Relation  Age of Onset   Diabetes Mother    CAD Mother    Liver disease Father     Social History   Socioeconomic History   Marital status: Married    Spouse name: Not on file   Number of children: 2   Years of education: Not on file   Highest education level: Not on file  Occupational History   Occupation: disabled  Tobacco Use   Smoking status: Former    Current packs/day: 0.00    Types: Cigarettes    Quit date: 02/20/2007    Years since quitting: 16.1   Smokeless tobacco: Never  Vaping Use   Vaping status: Never Used  Substance and Sexual Activity    Alcohol use: No    Alcohol/week: 0.0 standard drinks of alcohol    Comment: None in years   Drug use: No   Sexual activity: Yes    Partners: Female  Other Topics Concern   Not on file  Social History Narrative   Pt lives with wife and daughter in [place].    Social Determinants of Health   Financial Resource Strain: Low Risk  (11/14/2021)   Overall Financial Resource Strain (CARDIA)    Difficulty of Paying Living Expenses: Not hard at all  Food Insecurity: No Food Insecurity (08/29/2020)   Hunger Vital Sign    Worried About Running Out of Food in the Last Year: Never true    Ran Out of Food in the Last Year: Never true  Transportation Needs: No Transportation Needs (11/14/2021)   PRAPARE - Administrator, Civil Service (Medical): No    Lack of Transportation (Non-Medical): No  Physical Activity: Inactive (12/17/2022)   Exercise Vital Sign    Days of Exercise per Week: 0 days    Minutes of Exercise per Session: 0 min  Stress: No Stress Concern Present (06/24/2022)   [employer] of Occupational Health - Occupational Stress Questionnaire    Feeling of Stress : Not at all  Social Connections: Moderately Integrated (06/24/2022)   Social Connection and Isolation Panel [NHANES]    Frequency of Communication with Friends and Family: More than three times a week    Frequency of Social Gatherings with Friends and Family: More than three times a week    Attends Religious Services: More than 4 times per year    Active Member of [employer] or Organizations: No    Attends Banker Meetings: Never    Marital Status: Married  Catering manager Violence: Not At Risk (06/24/2022)   Humiliation, Afraid, Rape, and Kick questionnaire    Fear of Current or Ex-Partner: No    Emotionally Abused: No    Physically Abused: No    Sexually Abused: No    Outpatient Medications Prior to Visit  Medication Sig Dispense Refill   aspirin EC 81 MG tablet Take 81 mg by mouth daily.      atorvastatin (LIPITOR) 80 MG tablet Take 1 tablet (80 mg total) by mouth daily. 90 tablet 3   cholecalciferol (VITAMIN D3) 25 MCG (1000 UNIT) tablet Take 1 tablet (1,000 Units total) by mouth daily. 30 tablet 1   Continuous Glucose Sensor (FREESTYLE LIBRE 14 DAY SENSOR) MISC USE AS DIRECTED EVERY  14  DAYS 3 each 6   fenofibrate (TRICOR) 145 MG tablet TAKE 1 TABLET BY MOUTH AT BEDTIME 90 tablet 0   Glucagon (GVOKE HYPOPEN 2-PACK) 1 MG/0.2ML SOAJ Inject 1 Dose into the skin as needed (hypoglycemia). 0.2 mL 1  lisinopril (ZESTRIL) 2.5 MG tablet Take 1 tablet by mouth once daily 90 tablet 0   metFORMIN (GLUCOPHAGE) 1000 MG tablet Take 1 tablet (1,000 mg total) by mouth 2 (two) times daily. 180 tablet 0   methocarbamol (ROBAXIN) 750 MG tablet Take 1 tablet (750 mg total) by mouth every 6 (six) hours as needed (TMJ). 120 tablet 2   metoprolol succinate (TOPROL-XL) 50 MG 24 hr tablet Take 1 tablet (50 mg total) by mouth daily. Take with or immediately following a meal. 90 tablet 3   mupirocin ointment (BACTROBAN) 2 % Apply topically 2 (two) times daily. 22 g 6   nitroGLYCERIN (NITROSTAT) 0.4 MG SL tablet DISSOLVE ONE TABLET UNDER THE TONGUE EVERY 5 MINUTES AS NEEDED FOR CHEST PAIN.  DO NOT EXCEED A TOTAL OF 3 DOSES IN 15 MINUTES 25 tablet 0   omeprazole (PRILOSEC) 40 MG capsule Take 1 capsule (40 mg total) by mouth daily. 30 capsule 3   ondansetron (ZOFRAN) 4 MG tablet Take 1 tablet (4 mg total) by mouth every 8 (eight) hours as needed for nausea or vomiting. 20 tablet 0   ONETOUCH VERIO test strip 1 each by Other route daily. 100 each 2   pregabalin (LYRICA) 75 MG capsule Take 1 capsule (75 mg total) by mouth 2 (two) times daily. 60 capsule 2   SHINGRIX injection      TRESIBA FLEXTOUCH 100 UNIT/ML FlexTouch Pen INJECT 45 UNITS SUBCUTANEOUSLY ONCE DAILY 45 mL 0   vitamin C (VITAMIN C) 500 MG tablet Take 1 tablet (500 mg total) by mouth 2 (two) times daily. 60 tablet 2   zolpidem (AMBIEN) 10 MG tablet  Take 1 tablet (10 mg total) by mouth at bedtime as needed for sleep. 30 tablet 5   albuterol (VENTOLIN HFA) 108 (90 Base) MCG/ACT inhaler Inhale 2 puffs into the lungs every 6 (six) hours as needed for wheezing or shortness of breath. 8 g 3   insulin lispro (HUMALOG KWIKPEN) 200 UNIT/ML KwikPen Check sugars before meals Start 4 U prior to breakfast, lunch, and supper. + 1 u if sugar is greater than 200. + 2 u if sugar between 201-250 + 3 U if sugar between 251-300 + 4 U if sugar between 301-350 + 5 U if sugar between 351-400 + 6 U if sugar > 400. 15 mL 1   meloxicam (MOBIC) 15 MG tablet Take 1 tablet (15 mg total) by mouth daily. 90 tablet 1   Rimegepant Sulfate (NURTEC) 75 MG TBDP Take one tablet a day as needed 30 tablet 1   No facility-administered medications prior to visit.    Allergies  Allergen Reactions   Ozempic (0.25 Or 0.5 Mg-Dose) [Semaglutide(0.25 Or 0.5mg -Dos)]     Abdominal pain    Review of Systems  Constitutional:  Negative for chills and fever.  HENT:  Negative for congestion, sinus pain and sore throat.   Respiratory:  Negative for cough and shortness of breath.   Cardiovascular:  Negative for chest pain.  Gastrointestinal:  Negative for constipation, nausea and vomiting.  Genitourinary: Negative.   Musculoskeletal: Negative.        Left foot pain   Skin:  Positive for wound.  Neurological:  Negative for weakness and headaches.       Objective:        04/01/2023    3:37 PM 03/31/2023    9:19 AM 12/24/2022    2:44 PM  Vitals with BMI  Height 5\' 5"  5\' 5"  5\' 5"   Weight  195 lbs 192 lbs 188 lbs 10 oz  BMI 32.45 31.95 31.38  Systolic 134 122 811  Diastolic 64 60 70  Pulse 70 78 76    No data found.   Physical Exam Vitals reviewed.  Constitutional:      General: He is not in acute distress.    Appearance: Normal appearance. He is not ill-appearing.  Cardiovascular:     Rate and Rhythm: Normal rate and regular rhythm.     Heart sounds: Normal heart  sounds.  Pulmonary:     Effort: Pulmonary effort is normal.     Breath sounds: Normal breath sounds.  Feet:     Right foot:     Toenail Condition: Right toenails are abnormally thick.     Left foot:     Skin integrity: Erythema and warmth present.     Toenail Condition: Left toenails are abnormally thick.  Skin:    Findings: Wound (left top of foot - puncture wound from rusty nail) present.  Neurological:     Mental Status: He is alert. Mental status is at baseline.  Psychiatric:        Mood and Affect: Mood normal.        Behavior: Behavior normal.     Health Maintenance Due  Topic Date Due   Zoster Vaccines- Shingrix (2 of 2) 03/07/2022   OPHTHALMOLOGY EXAM  12/26/2022    There are no preventive care reminders to display for this patient.   Lab Results  Component Value Date   TSH 2.829 12/18/2014   Lab Results  Component Value Date   WBC 6.1 03/31/2023   HGB 14.2 03/31/2023   HCT 43.0 03/31/2023   MCV 88 03/31/2023   PLT 178 03/31/2023   Lab Results  Component Value Date   NA 142 03/31/2023   K 4.6 03/31/2023   CO2 23 03/31/2023   GLUCOSE 141 (H) 03/31/2023   BUN 20 03/31/2023   CREATININE 0.98 03/31/2023   BILITOT 0.7 03/31/2023   ALKPHOS 63 03/31/2023   AST 21 03/31/2023   ALT 23 03/31/2023   PROT 6.1 03/31/2023   ALBUMIN 4.2 03/31/2023   CALCIUM 9.3 03/31/2023   ANIONGAP 12 07/24/2015   EGFR 83 03/31/2023   Lab Results  Component Value Date   CHOL 124 03/31/2023   Lab Results  Component Value Date   HDL 35 (L) 03/31/2023   Lab Results  Component Value Date   LDLCALC 72 03/31/2023   Lab Results  Component Value Date   TRIG 90 03/31/2023   Lab Results  Component Value Date   CHOLHDL 3.5 03/31/2023   Lab Results  Component Value Date   HGBA1C 8.9 (H) 03/31/2023       Assessment & Plan:  Nail wound of sole of foot Assessment & Plan: Patient stepped on a rusty nail, causing a puncture wound on the left foot. The wound is painful,  throbbing, warm, and bleeding. The patient reports feeling as though the nail hit something hard, possibly bone. -Order foot X-ray to rule out bone involvement. -Administer tetanus shot today. -Prescribe antibiotics to cover Staphylococcus aureus and Pseudomonas (common pathogens in puncture wounds).Cipro 500 mg.po TWICE A DAY for 7 days -Advise patient to monitor for signs of infection (increased pain, redness, swelling, or pus).   Orders: -     DG Foot Complete Left; Future -     Ciprofloxacin HCl; Take 1 tablet (500 mg total) by mouth 2 (two) times daily for 7 days.  Dispense: 14 tablet; Refill: 0  Immunization due Assessment & Plan: Patient received COVID and flu vaccines yesterday, but not the planned tetanus vaccine. -Administer tetanus vaccine today.  Orders: -     Tdap vaccine greater than or equal to 7yo IM     Meds ordered this encounter  Medications   ciprofloxacin (CIPRO) 500 MG tablet    Sig: Take 1 tablet (500 mg total) by mouth 2 (two) times daily for 7 days.    Dispense:  14 tablet    Refill:  0    Orders Placed This Encounter  Procedures   DG Foot Complete Left   Tdap vaccine greater than or equal to 7yo IM     Follow-up: Return if symptoms worsen or fail to improve.  An After Visit Summary was printed and given to the patient.  Total time spent on today's visit was greater than 30 minutes, including both face-to-face time and nonface-to-face time personally spent on review of chart (labs and imaging), discussing labs and goals, discussing further work-up, treatment options, referrals to specialist if needed, reviewing outside records if pertinent, answering patient's questions, and coordinating care.   Lajuana Matte, FNP [facility] [phone]

## 2023-04-01 NOTE — Assessment & Plan Note (Signed)
Education given. 

## 2023-04-02 ENCOUNTER — Ambulatory Visit: Payer: Self-pay | Admitting: Family Medicine

## 2023-04-02 NOTE — Telephone Encounter (Signed)
   Chief Complaint: severe foot pain Symptoms: left foot pain, swelling, redness Pertinent Negatives: Patient denies fever, numbness, drainage Disposition: [] ED /[] Urgent Care (no appt availability in office) / [x] Appointment(In office/virtual)/ []  [facility]/ [] Home Care/ [] Refused Recommended Disposition /[] [facility]/ []  Follow-up with PCP Additional Notes: Patient reports he was seen in office yesterday for a nail in his left foot and that he had an Xray done. Patient states that his pain has increased since his appointment and is now severe. Patient reports he is now unable to put pressure on his left foot and he notices new redness and swelling at the top of his left foot. Patient denies fever, drainage, and numbness. Patient had questions pertaining to if his Xray results had been read. This RN advised patient of Xray results per note from [facility]. Per note from FNP states to follow-up if swelling or redness worsens. Appt scheduled for patient 11/15. Patient verbalized understanding.     Good morning, We received the results back from your xray. I hope you are feeling better. The xray findings did not show any fracture or focal destruction. The joint spaces of the forefoot, midfoot, and hindfoot are maintained. Please make a follow-up appointment if fever, purulent drainage or increased redness or swelling occur.   If you have any questions, please call our office.   Thank you, Lajuana Matte, FNP [facility] [phone]  Written by Renne Crigler, FNP on 04/02/2023  9:56 AM EST  Copied from CRM [id number]. Topic: Clinical - Red Word Triage >> Apr 02, 2023  3:20 PM Danika B wrote: Red Word that prompted transfer to Nurse Triage: Severe Pain; patient was already seen for a nail in foot injury yesterday and also had xray done. Patient calling back due to severe pain in foot and not being prescribed any pain medication. Wants to also know next steps after xray due to  the extreme pain. Reason for Disposition  [1] Swollen foot AND [2] no fever  (Exceptions: localized bump from bunions, calluses, insect bite, sting)  Answer Assessment - Initial Assessment Questions 1. ONSET: "When did the pain start?"      Patient was seen for a nail in his foot yesterday and pain has gotten worse 2. LOCATION: "Where is the pain located?"      Left foot 3. PAIN: "How bad is the pain?"    (Scale 1-10; or mild, moderate, severe)  - MILD (1-3): doesn't interfere with normal activities.   - MODERATE (4-7): interferes with normal activities (e.g., work or school) or awakens from sleep, limping.   - SEVERE (8-10): excruciating pain, unable to do any normal activities, unable to walk.      Severe, unable to put pressure on his foot 4. WORK OR EXERCISE: "Has there been any recent work or exercise that involved this part of the body?"      no 5. CAUSE: "What do you think is causing the foot pain?"     Patient had a nail in his foot yesterday and thinks it may be related 6. OTHER SYMPTOMS: "Do you have any other symptoms?" (e.g., leg pain, rash, fever, numbness)     Swelling on front of foot, severe pain  Protocols used: Foot Pain-A-AH

## 2023-04-03 ENCOUNTER — Ambulatory Visit (INDEPENDENT_AMBULATORY_CARE_PROVIDER_SITE_OTHER): Payer: PPO | Admitting: Physician Assistant

## 2023-04-03 ENCOUNTER — Encounter: Payer: Self-pay | Admitting: Physician Assistant

## 2023-04-03 VITALS — BP 110/60 | HR 73 | Temp 97.6°F | Resp 14 | Ht 65.0 in | Wt 193.0 lb

## 2023-04-03 DIAGNOSIS — S91339A Puncture wound without foreign body, unspecified foot, initial encounter: Secondary | ICD-10-CM | POA: Diagnosis not present

## 2023-04-03 DIAGNOSIS — E1165 Type 2 diabetes mellitus with hyperglycemia: Secondary | ICD-10-CM | POA: Diagnosis not present

## 2023-04-03 DIAGNOSIS — W450XXA Nail entering through skin, initial encounter: Secondary | ICD-10-CM | POA: Diagnosis not present

## 2023-04-03 DIAGNOSIS — Z794 Long term (current) use of insulin: Secondary | ICD-10-CM | POA: Diagnosis not present

## 2023-04-03 MED ORDER — PREGABALIN 100 MG PO CAPS
100.0000 mg | ORAL_CAPSULE | Freq: Two times a day (BID) | ORAL | 0 refills | Status: DC
Start: 2023-04-03 — End: 2023-04-11

## 2023-04-03 NOTE — Assessment & Plan Note (Signed)
A1c was 8.9, indicating poor control. Patient is starting on Omnipod. -Continue current diabetes management plan. -Follow up with Omnipod nurse.

## 2023-04-03 NOTE — Progress Notes (Signed)
Subjective:  Patient ID: Mitchell Hammond, male    DOB: December 16, 1955  Age: 67 y.o. MRN: [id number]  Chief Complaint  Patient presents with   Foot Injury    HPI   Patient injured his  left foot on 04/01/23 when he stepped on a rusty nail, causing a puncture wound on the left foot. He is taking ciprofloxacin and he received tetanus shot  His feet is red in the top and swelling and painful. He is worried. His sugar this morning is 214.  Discussed the use of AI scribe software for clinical note transcription with the patient, who gave verbal consent to proceed.  History of Present Illness   The patient, a 67 year old male with a history of diabetes, presents for a follow-up visit after stepping on a rusted nail while wearing a Croc shoe. The patient reports persistent foot pain and inability to bear weight on the affected foot. He describes the pain as throbbing and notes that it has been keeping him awake at night. He also reports a clear discharge from the wound and increased redness and swelling around the site of injury. The patient has been taking prescribed antibiotics and has not reported any side effects such as diarrhea, nausea, or vomiting.          03/31/2023    9:22 AM 11/10/2022   10:10 AM 06/24/2022    8:53 AM 12/25/2021    8:25 AM 07/24/2020    9:32 AM  Depression screen PHQ 2/9  Decreased Interest 0 0 0 0 2  Down, Depressed, Hopeless 0 0 0 0 1  PHQ - 2 Score 0 0 0 0 3  Altered sleeping 2 0  3 3  Tired, decreased energy 1 0  1 1  Change in appetite 0 0  0 0  Feeling bad or failure about yourself  0 0  0 0  Trouble concentrating 0 0  0 1  Moving slowly or fidgety/restless 0 0  0 0  Suicidal thoughts 0 0  0 0  PHQ-9 Score 3 0  4 8  Difficult doing work/chores Not difficult at all Not difficult at all  Not difficult at all Somewhat difficult        03/31/2023    9:22 AM  Fall Risk   Falls in the past year? 0  Number falls in past yr: 0  Injury with Fall? 0  Risk for fall  due to : No Fall Risks  Follow up Falls evaluation completed    Patient Care Team: Blane Ohara, MD as PCP - General (Family Medicine) Revankar, Aundra Dubin, MD as PCP - Cardiology (Cardiology) Zettie Pho, [facility] (Inactive) (Pharmacist)   Review of Systems  Constitutional:  Negative for chills, fatigue, fever and unexpected weight change.  HENT:  Negative for congestion, ear pain, sinus pain and sore throat.   Respiratory:  Negative for cough and shortness of breath.   Cardiovascular:  Negative for chest pain and palpitations.  Gastrointestinal:  Negative for abdominal pain, blood in stool, constipation, diarrhea, nausea and vomiting.  Endocrine: Negative for polydipsia.  Genitourinary:  Negative for dysuria.  Musculoskeletal:  Positive for arthralgias, joint swelling and myalgias. Negative for back pain.  Skin:  Positive for wound. Negative for rash.  Neurological:  Negative for headaches.    Current Outpatient Medications on File Prior to Visit  Medication Sig Dispense Refill   aspirin EC 81 MG tablet Take 81 mg by mouth daily.     atorvastatin (LIPITOR)  80 MG tablet Take 1 tablet (80 mg total) by mouth daily. 90 tablet 3   cholecalciferol (VITAMIN D3) 25 MCG (1000 UNIT) tablet Take 1 tablet (1,000 Units total) by mouth daily. 30 tablet 1   ciprofloxacin (CIPRO) 500 MG tablet Take 1 tablet (500 mg total) by mouth 2 (two) times daily for 7 days. 14 tablet 0   Continuous Glucose Sensor (FREESTYLE LIBRE 14 DAY SENSOR) MISC USE AS DIRECTED EVERY  14  DAYS 3 each 6   fenofibrate (TRICOR) 145 MG tablet TAKE 1 TABLET BY MOUTH AT BEDTIME 90 tablet 0   Glucagon (GVOKE HYPOPEN 2-PACK) 1 MG/0.2ML SOAJ Inject 1 Dose into the skin as needed (hypoglycemia). 0.2 mL 1   Insulin Disposable Pump (OMNIPOD DASH PDM, GEN 4,) KIT 1 each by Does not apply route as directed. 1 kit 0   Insulin Disposable Pump (OMNIPOD DASH PODS, GEN 4,) MISC 1 each by Does not apply route every 3 (three) days. 10 each 2    insulin lispro (HUMALOG) 100 UNIT/ML injection Check sugars before meals Start 4 U prior to breakfast, lunch, and supper. + 1 u if sugar is greater than 200. + 2 u if sugar between 201-250 + 3 U if sugar between 251-300 + 4 U if sugar between 301-350 + 5 U if sugar between 351-400 + 6 U if sugar > 400. 10 mL 2   lisinopril (ZESTRIL) 2.5 MG tablet Take 1 tablet by mouth once daily 90 tablet 0   metFORMIN (GLUCOPHAGE) 1000 MG tablet Take 1 tablet (1,000 mg total) by mouth 2 (two) times daily. 180 tablet 0   methocarbamol (ROBAXIN) 750 MG tablet Take 1 tablet (750 mg total) by mouth every 6 (six) hours as needed (TMJ). 120 tablet 2   metoprolol succinate (TOPROL-XL) 50 MG 24 hr tablet Take 1 tablet (50 mg total) by mouth daily. Take with or immediately following a meal. 90 tablet 3   mupirocin ointment (BACTROBAN) 2 % Apply topically 2 (two) times daily. 22 g 6   nitroGLYCERIN (NITROSTAT) 0.4 MG SL tablet DISSOLVE ONE TABLET UNDER THE TONGUE EVERY 5 MINUTES AS NEEDED FOR CHEST PAIN.  DO NOT EXCEED A TOTAL OF 3 DOSES IN 15 MINUTES 25 tablet 0   omeprazole (PRILOSEC) 40 MG capsule Take 1 capsule (40 mg total) by mouth daily. 30 capsule 3   ondansetron (ZOFRAN) 4 MG tablet Take 1 tablet (4 mg total) by mouth every 8 (eight) hours as needed for nausea or vomiting. 20 tablet 0   ONETOUCH VERIO test strip 1 each by Other route daily. 100 each 2   pregabalin (LYRICA) 75 MG capsule Take 1 capsule (75 mg total) by mouth 2 (two) times daily. 60 capsule 2   SHINGRIX injection      TRESIBA FLEXTOUCH 100 UNIT/ML FlexTouch Pen INJECT 45 UNITS SUBCUTANEOUSLY ONCE DAILY 45 mL 0   vitamin C (VITAMIN C) 500 MG tablet Take 1 tablet (500 mg total) by mouth 2 (two) times daily. 60 tablet 2   zolpidem (AMBIEN) 10 MG tablet Take 1 tablet (10 mg total) by mouth at bedtime as needed for sleep. 30 tablet 5   No current facility-administered medications on file prior to visit.   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 07/05/2019   Diabetes mellitus due to underlying condition with unspecified complications (HCC) 12/26/2015   Diabetic polyneuropathy associated with type 2 diabetes mellitus (HCC) 08/20/2016   Discitis    Essential hypertension 12/26/2015   Fracture, tibial plateau 12/18/2014   Hypertension    IDDM (insulin dependent diabetes mellitus)    type 2   Metatarsalgia of both feet 08/20/2016   MI (myocardial infarction) (HCC)    x 2, Multi-link Vision 3.5 x 15 mm BMS in 2009 (mid LAD), balloon 2015   Mixed hyperlipidemia 06/14/2020   Obesity (BMI 30.0-34.9) 06/26/2020   Tibial plateau fracture 12/18/2014   Vitamin D insufficiency    Past Surgical History:  Procedure Laterality Date   BACK SURGERY     2014   CARDIAC CATHETERIZATION     x 2, 2009 & 2015   COLONOSCOPY     CORONARY ANGIOPLASTY     stent - 2008, balloon - 2015   FASCIOTOMY Right 12/19/2014   Procedure: ANTERIOR COMPARTMENTAL FASCIOTOMY;  Surgeon: Myrene Galas, MD;  Location: [facility] OR;  Service: Orthopedics;  Laterality: Right;   HAND SURGERY Left    HARDWARE REMOVAL Right 07/24/2015   Procedure: HARDWARE REMOVAL RIGHT TIBIA;  Surgeon: Myrene Galas, MD;  Location: [facility] OR;  Service: Orthopedics;  Laterality: Right;   KNEE ARTHROSCOPY Right 07/24/2015   Procedure: RIGHT ARTHROSCOPY KNEE WITH PARTIAL SYNOVECTOMY AND MEDIAL MENISCECTOMY;  Surgeon: Myrene Galas, MD;  Location: [facility] OR;  Service: Orthopedics;  Laterality: Right;   LEFT HEART CATH AND CORONARY ANGIOGRAPHY N/A 07/02/2020   Procedure: LEFT HEART CATH AND CORONARY ANGIOGRAPHY;  Surgeon: Lennette Bihari, MD;  Location: [facility] INVASIVE CV LAB;  Service: Cardiovascular;  Laterality: N/A;   LEFT HEART CATH AND CORONARY ANGIOGRAPHY N/A 01/22/2022   Procedure:  LEFT HEART CATH AND CORONARY ANGIOGRAPHY;  Surgeon: Iran Ouch, MD;  Location: [facility] INVASIVE CV LAB;  Service: Cardiovascular;  Laterality: N/A;   ORIF TIBIA PLATEAU Right 12/19/2014   Procedure: OPEN REDUCTION INTERNAL FIXATION (ORIF) RIGHT TIBIAL PLATEAU;  Surgeon: Myrene Galas, MD;  Location: [facility] OR;  Service: Orthopedics;  Laterality: Right;   TONSILLECTOMY     VASECTOMY      Family History  Problem Relation Age of Onset   Diabetes Mother    CAD Mother    Liver disease Father    Social History   Socioeconomic History   Marital status: Married    Spouse name: Not on file   Number of children: 2   Years of education: Not on file   Highest education level: Not on file  Occupational History   Occupation: disabled  Tobacco Use   Smoking status: Former    Current packs/day: 0.00    Types: Cigarettes    Quit date: 02/20/2007    Years since quitting: 16.1   Smokeless tobacco: Never  Vaping Use   Vaping status: Never Used  Substance and Sexual Activity   Alcohol use: No    Alcohol/week: 0.0 standard drinks of alcohol    Comment: None in years   Drug use: No   Sexual activity: Yes    Partners: Female  Other Topics Concern   Not on file  Social History Narrative   Pt lives with wife and daughter in [place].    Social Determinants of Health   Financial Resource Strain: Low Risk  (11/14/2021)   Overall Financial Resource Strain (CARDIA)    Difficulty of Paying Living  Expenses: Not hard at all  Food Insecurity: No Food Insecurity (08/29/2020)   Hunger Vital Sign    Worried About Running Out of Food in the Last Year: Never true    Ran Out of Food in the Last Year: Never true  Transportation Needs: No Transportation Needs (11/14/2021)   PRAPARE - Administrator, Civil Service (Medical): No    Lack of Transportation (Non-Medical): No  Physical Activity: Inactive (12/17/2022)   Exercise Vital Sign    Days of Exercise per Week: 0 days    Minutes of Exercise per  Session: 0 min  Stress: No Stress Concern Present (06/24/2022)   [employer] of Occupational Health - Occupational Stress Questionnaire    Feeling of Stress : Not at all  Social Connections: Moderately Integrated (06/24/2022)   Social Connection and Isolation Panel [NHANES]    Frequency of Communication with Friends and Family: More than three times a week    Frequency of Social Gatherings with Friends and Family: More than three times a week    Attends Religious Services: More than 4 times per year    Active Member of [employer] or Organizations: No    Attends Engineer, structural: Never    Marital Status: Married    Objective:  BP 110/60   Pulse 73   Temp 97.6 F (36.4 C)   Resp 14   Ht 5\' 5"  (1.651 m)   Wt 193 lb (87.5 kg)   SpO2 96%   BMI 32.12 kg/m      04/03/2023    7:43 AM 04/01/2023    3:37 PM 03/31/2023    9:19 AM  BP/Weight  Systolic BP 110 134 122  Diastolic BP 60 64 60  Wt. (Lbs) 193 195 192  BMI 32.12 kg/m2 32.45 kg/m2 31.95 kg/m2    Physical Exam Vitals reviewed.  Constitutional:      Appearance: Normal appearance.  Cardiovascular:     Rate and Rhythm: Normal rate and regular rhythm.     Heart sounds: Normal heart sounds.  Pulmonary:     Effort: Pulmonary effort is normal.     Breath sounds: Normal breath sounds.  Abdominal:     General: Bowel sounds are normal.     Palpations: Abdomen is soft.     Tenderness: There is no abdominal tenderness.  Musculoskeletal:     Right foot: Normal.     Left foot: Decreased range of motion. Normal capillary refill. Swelling, laceration and tenderness present. Normal pulse.     Comments: Clear drainage noted from wound Minor redness at base of 3,4 toe.  Slight redness on dorsal aspect of foot and plantar area at base of 3,4 toe Edema noted throughout foot No sign of active infection at sight  Neurological:     Mental Status: He is alert and oriented to person, place, and time.  Psychiatric:         Mood and Affect: Mood normal.        Behavior: Behavior normal.     Diabetic Foot Exam - Simple   No data filed      Lab Results  Component Value Date   WBC 6.1 03/31/2023   HGB 14.2 03/31/2023   HCT 43.0 03/31/2023   PLT 178 03/31/2023   GLUCOSE 141 (H) 03/31/2023   CHOL 124 03/31/2023   TRIG 90 03/31/2023   HDL 35 (L) 03/31/2023   LDLCALC 72 03/31/2023   ALT 23 03/31/2023   AST 21 03/31/2023  NA 142 03/31/2023   K 4.6 03/31/2023   CL 105 03/31/2023   CREATININE 0.98 03/31/2023   BUN 20 03/31/2023   CO2 23 03/31/2023   TSH 2.829 12/18/2014   INR 1.20 12/18/2014   HGBA1C 8.9 (H) 03/31/2023   MICROALBUR 80 06/14/2020      Assessment & Plan:    Nail wound of sole of foot Assessment & Plan: Pain, swelling, and redness noted. Clear discharge from wound. No fever reported. Patient is on antibiotics. Possible nerve damage due to pain and sensitivity in toes. -Continue antibiotics as prescribed. -Monitor for increased redness, swelling, inability to move toes, and fever. -Use ice to help with swelling. -Keep foot elevated as much as possible. -Increase Lyrica to 100mg  for two weeks to manage nerve pain. -Follow up with podiatrist next week.  Orders: -     Pregabalin; Take 1 capsule (100 mg total) by mouth 2 (two) times daily.  Dispense: 14 capsule; Refill: 0  Type 2 diabetes mellitus with hyperglycemia, with long-term current use of insulin (HCC) Assessment & Plan: A1c was 8.9, indicating poor control. Patient is starting on Omnipod. -Continue current diabetes management plan. -Follow up with Omnipod nurse.       Meds ordered this encounter  Medications   pregabalin (LYRICA) 100 MG capsule    Sig: Take 1 capsule (100 mg total) by mouth 2 (two) times daily.    Dispense:  14 capsule    Refill:  0    No orders of the defined types were placed in this encounter.   Assessment and Plan          Follow-up: No follow-ups on file.   I,[name] I  Leal-Borjas,acting as a scribe for [employer], PA.,have documented all relevant documentation on the behalf of Langley Gauss, PA,as directed by  Langley Gauss, PA while in the presence of Langley Gauss, [place].   An After Visit Summary was printed and given to the patient.  Langley Gauss, [place] [facility] [phone]

## 2023-04-03 NOTE — Assessment & Plan Note (Signed)
Pain, swelling, and redness noted. Clear discharge from wound. No fever reported. Patient is on antibiotics. Possible nerve damage due to pain and sensitivity in toes. -Continue antibiotics as prescribed. -Monitor for increased redness, swelling, inability to move toes, and fever. -Use ice to help with swelling. -Keep foot elevated as much as possible. -Increase Lyrica to 100mg  for two weeks to manage nerve pain. -Follow up with podiatrist next week.

## 2023-04-08 ENCOUNTER — Other Ambulatory Visit: Payer: Self-pay

## 2023-04-08 MED ORDER — FREESTYLE LIBRE 14 DAY SENSOR MISC: 6 refills | Status: DC

## 2023-04-11 ENCOUNTER — Other Ambulatory Visit: Payer: Self-pay | Admitting: Family Medicine

## 2023-04-11 ENCOUNTER — Other Ambulatory Visit: Payer: Self-pay | Admitting: Cardiology

## 2023-04-11 ENCOUNTER — Other Ambulatory Visit: Payer: Self-pay | Admitting: Physician Assistant

## 2023-04-11 DIAGNOSIS — E1165 Type 2 diabetes mellitus with hyperglycemia: Secondary | ICD-10-CM

## 2023-04-11 DIAGNOSIS — W450XXA Nail entering through skin, initial encounter: Secondary | ICD-10-CM

## 2023-04-11 DIAGNOSIS — S91339A Puncture wound without foreign body, unspecified foot, initial encounter: Secondary | ICD-10-CM

## 2023-04-11 DIAGNOSIS — I25118 Atherosclerotic heart disease of native coronary artery with other forms of angina pectoris: Secondary | ICD-10-CM

## 2023-04-11 MED ORDER — LISINOPRIL 2.5 MG PO TABS: 2.5000 mg | ORAL_TABLET | Freq: Every day | ORAL | 0 refills | Status: DC

## 2023-04-12 MED ORDER — PREGABALIN 100 MG PO CAPS: 100.0000 mg | ORAL_CAPSULE | Freq: Two times a day (BID) | ORAL | 0 refills | Status: DC

## 2023-04-13 MED ORDER — NITROGLYCERIN 0.4 MG SL SUBL: 0.4000 mg | SUBLINGUAL_TABLET | SUBLINGUAL | 8 refills | Status: DC | PRN

## 2023-04-14 ENCOUNTER — Other Ambulatory Visit: Payer: Self-pay | Admitting: Physician Assistant

## 2023-04-14 DIAGNOSIS — W450XXA Nail entering through skin, initial encounter: Secondary | ICD-10-CM

## 2023-04-22 ENCOUNTER — Telehealth: Payer: Self-pay

## 2023-04-22 NOTE — Telephone Encounter (Signed)
Copied from CRM [id number]. Topic: Clinical - Medication Question >> Apr 21, 2023  4:27 PM Mitchell Hammond R wrote: Reason for CRM: Pt called in about his Insulin Disposable Pump (OMNIPOD DASH PDM, GEN 4,) KIT and Insulin Disposable Pump (OMNIPOD DASH PODS, GEN 4,) MISC. He said that someone was supposed to call him to walk him through the set up. Its been about a month now and he hasn't heard anything back. CB# [phone]. Also has questions about the insulin lispro (HUMALOG) 100 UNIT/ML injection and HUMALIN.

## 2023-04-22 NOTE — Telephone Encounter (Signed)
Call patient back left message for patient to call office back.

## 2023-05-05 ENCOUNTER — Ambulatory Visit: Payer: PPO

## 2023-05-05 ENCOUNTER — Encounter: Payer: Self-pay | Admitting: Podiatry

## 2023-05-05 ENCOUNTER — Ambulatory Visit (INDEPENDENT_AMBULATORY_CARE_PROVIDER_SITE_OTHER): Payer: PPO | Admitting: Podiatry

## 2023-05-05 DIAGNOSIS — M2142 Flat foot [pes planus] (acquired), left foot: Secondary | ICD-10-CM

## 2023-05-05 DIAGNOSIS — M79674 Pain in right toe(s): Secondary | ICD-10-CM

## 2023-05-05 DIAGNOSIS — B351 Tinea unguium: Secondary | ICD-10-CM | POA: Diagnosis not present

## 2023-05-05 DIAGNOSIS — I739 Peripheral vascular disease, unspecified: Secondary | ICD-10-CM

## 2023-05-05 DIAGNOSIS — E1142 Type 2 diabetes mellitus with diabetic polyneuropathy: Secondary | ICD-10-CM

## 2023-05-05 DIAGNOSIS — M79675 Pain in left toe(s): Secondary | ICD-10-CM | POA: Diagnosis not present

## 2023-05-05 NOTE — Progress Notes (Signed)
Patient presents to the office today for diabetic shoe and insole measuring.  Patient was measured with brannock device to determine size and width for 1 pair of extra depth shoes and foam casted for 3 pair of insoles.   Documentation of medical necessity will be sent to patient's treating diabetic doctor to verify and sign.   Patient's diabetic provider: Danie Binder MD   Shoes and insoles will be ordered at that time and patient will be notified for an appointment for fitting when they arrive.   Shoe size (per patient): 8-9 Brannock measurement: 8.5 Patient shoe selection- Shoe choice:   [id number] / [id number] Shoe size ordered: 8.5WD  Financials signed

## 2023-05-07 ENCOUNTER — Other Ambulatory Visit: Payer: Self-pay | Admitting: Family Medicine

## 2023-05-07 MED ORDER — ONETOUCH VERIO VI STRP: 1.0000 | ORAL_STRIP | Freq: Every day | 3 refills | Status: DC

## 2023-05-07 NOTE — Progress Notes (Signed)
  Subjective:  Patient ID: Mitchell Hammond, male    DOB: Sep 18, 1955,  MRN: [id number]  Chief Complaint  Patient presents with   Nail Problem    Referred by PCP for toenail fungus. Needs nails trimmed. Last A1c: 8.0. Takes ASA 81 mg. He also has pinpoint sized non-healing areas on both lower legs. No drainage, no bleeding. Some sensitivity when he scratches the scab off.     67 y.o. male presents with the above complaint. History confirmed with patient. Patient presenting with pain related to dystrophic thickened elongated nails. Patient is unable to trim own nails related to nail dystrophy and/or mobility issues. Patient does have a history of T2DM.  Last A1c was 8.0.  Patient does endorse numbness and tingling to the toes.  He also reports that he has had pinpoint nonhealing scabs and abrasions on the lower legs, both present for several months.  Objective:  Physical Exam: warm to cool, good capillary refill to the digits, pedal hair growth absent.  Brawny shiny skin changes to anterior shins. nail exam onychomycosis of the toenails, dystrophic nails, with yellow discoloration and subungual debris DP pulses faintly palpable, PT pulses nonpalpable, protective sensation absent, and vibratory sensation absent Left Foot:  Pain with palpation of nails due to elongation and dystrophic growth.  Right Foot: Pain with palpation of nails due to elongation and dystrophic growth.  Bilateral anterior shins there is stable pinpoint scab, nonhealing. Mild bunion and hammertoe contractures bilaterally. Assessment:   1. Diabetic polyneuropathy associated with type 2 diabetes mellitus (HCC)   2. Pain due to onychomycosis of toenails of both feet   3. PAD (peripheral artery disease) (HCC)      Plan:  Patient was evaluated and treated and all questions answered.   #Onychomycosis with pain  -Nails palliatively debrided as below. -Educated on self-care  Procedure: Nail Debridement Rationale: Pain Type of  Debridement: manual, sharp debridement. Instrumentation: Nail nipper, rotary burr. Number of Nails: 10  # Diabetes with neuropathy - Patient educated on diabetes. Discussed proper diabetic foot care and discussed risks and complications of disease. Educated patient in depth on reasons to return to the office immediately should he/she discover anything concerning or new on the feet. All questions answered. Discussed proper shoes as well.   -Order placed for diabetic shoes and 3 custom inserts, would benefit due to neuropathy, findings concerning for PAD and mild bunion and hammertoe deformities. -Patient reports neuropathy symptoms despite taking pregabalin.  States that he failed gabapentin previously due to side effects.  We will see if the patient can qualify for Qutenza.  # PAD -Diminished PT pulses with brawny skin changes, diminished pedal hair, nonhealing scabs to anterior shin. -Ordering ABIs to establish baseline.  Return in about 3 months (around 08/03/2023) for Diabetic Foot Care.         Bronwen Betters, DPM [facility] / [facility]

## 2023-05-14 ENCOUNTER — Telehealth: Payer: Self-pay

## 2023-05-19 ENCOUNTER — Ambulatory Visit (HOSPITAL_BASED_OUTPATIENT_CLINIC_OR_DEPARTMENT_OTHER)
Admission: RE | Admit: 2023-05-19 | Discharge: 2023-05-19 | Disposition: A | Discharge status: Home / Self Care | Payer: PPO | Source: Ambulatory Visit | Attending: Internal Medicine | Admitting: Internal Medicine

## 2023-05-19 ENCOUNTER — Encounter (HOSPITAL_BASED_OUTPATIENT_CLINIC_OR_DEPARTMENT_OTHER): Payer: Self-pay

## 2023-05-19 ENCOUNTER — Ambulatory Visit (HOSPITAL_BASED_OUTPATIENT_CLINIC_OR_DEPARTMENT_OTHER)
Admission: EM | Admit: 2023-05-19 | Discharge: 2023-05-19 | Disposition: A | Discharge status: Home / Self Care | Payer: PPO | Attending: Internal Medicine | Admitting: Internal Medicine

## 2023-05-19 DIAGNOSIS — J209 Acute bronchitis, unspecified: Secondary | ICD-10-CM

## 2023-05-19 DIAGNOSIS — J9811 Atelectasis: Secondary | ICD-10-CM | POA: Insufficient documentation

## 2023-05-19 DIAGNOSIS — Z794 Long term (current) use of insulin: Secondary | ICD-10-CM | POA: Diagnosis not present

## 2023-05-19 DIAGNOSIS — E1169 Type 2 diabetes mellitus with other specified complication: Secondary | ICD-10-CM

## 2023-05-19 DIAGNOSIS — R059 Cough, unspecified: Secondary | ICD-10-CM | POA: Diagnosis not present

## 2023-05-19 DIAGNOSIS — R0602 Shortness of breath: Secondary | ICD-10-CM

## 2023-05-19 LAB — POC COVID19/FLU A&B COMBO
Covid Antigen, POC: NEGATIVE
Influenza A Antigen, POC: NEGATIVE
Influenza B Antigen, POC: NEGATIVE

## 2023-05-19 MED ORDER — DEXAMETHASONE SODIUM PHOSPHATE 10 MG/ML IJ SOLN
10.0000 mg | Freq: Once | INTRAMUSCULAR | Status: AC
  Administered 2023-05-19: 10 mg via INTRAMUSCULAR

## 2023-05-19 MED ORDER — ALBUTEROL SULFATE (2.5 MG/3ML) 0.083% IN NEBU
2.5000 mg | INHALATION_SOLUTION | Freq: Once | RESPIRATORY_TRACT | Status: AC
  Administered 2023-05-19: 2.5 mg via RESPIRATORY_TRACT

## 2023-05-19 MED ORDER — BENZONATATE 100 MG PO CAPS: 100.0000 mg | ORAL_CAPSULE | Freq: Three times a day (TID) | ORAL | 0 refills | Status: DC

## 2023-05-19 MED ORDER — AEROCHAMBER PLUS FLO-VU MEDIUM MISC: 1.0000 | Freq: Once | 0 refills | Status: AC

## 2023-05-19 MED ORDER — ALBUTEROL SULFATE HFA 108 (90 BASE) MCG/ACT IN AERS: 1.0000 | INHALATION_SPRAY | Freq: Four times a day (QID) | RESPIRATORY_TRACT | 0 refills | Status: DC | PRN

## 2023-05-19 NOTE — ED Triage Notes (Signed)
X 3-4 days. States coughing up phlegm. Reports fever x 24 hours. Sore throat. Unable to sleep. States took cough syrup last night and mucinex with little relief. Has had no medication for pain or discomfort today. Patient is diabetic. Wears insulin pump.

## 2023-05-19 NOTE — Discharge Instructions (Addendum)
 Please go to [facility] and have x-ray performed. Do not check in to the ER. Go to imaging department, get images performed, then go home. You will receive a phone call if the x-ray shows any abnormal results requiring further treatment. If the x-ray results do not change our treatment plan, you will not receive a phone call.  Also see these results on MyChart.  Med [facility] [address] [place], [place]   You have bronchitis which is inflammation of the upper airways in your lungs due to a virus. The following medicines will help with your symptoms.   - I gave you a steroid injection in clinic to help with inflammation to chest causing bronchitis/wheezing and gave breathing treatment.  - You may use albuterol  inhaler 1 to 2 puffs every 4-6 hours as needed for cough, shortness of breath, and wheezing. - Take cough medicines as needed. - Continue using over the counter medicines as needed as directed. Plain mucinex (guaifenesin) over the counter may further help breakup mucus and help with symptoms.   If you develop any new or worsening symptoms or do not improve in the next 2 to 3 days, please return.  If your symptoms are severe, please go to the emergency room. Follow-up with PCP as needed.

## 2023-05-19 NOTE — ED Provider Notes (Addendum)
 [facility]    CSN: [id number] Arrival date & time: 05/19/23  9157      History   Chief Complaint Chief Complaint  Patient presents with   Cough   Sore Throat    HPI Mitchell Hammond is a 67 y.o. male.   Mitchell Hammond is a 67 y.o. male presenting for chief complaint of Cough, sore Throat, nasal congestion, generalized fatigue, and shortness of breath associated with cough that started 3 days ago on Saturday, May 16, 2023.  Cough is sometimes productive but mostly dry and worse at nighttime.  He states he can hear wheezing to his chest/noisy breathing at nighttime.  No orthopnea, leg swelling, dizziness, headache, or fever/chills.  Recently exposed to grandchildren who are sick with similar symptoms.  Max temp at home 101.02 days ago, responded well to use of Tylenol .  Never smoker, denies history of chronic respiratory problems like asthma and bronchitis.  History of coronary artery disease, type 2 diabetes, hyperlipidemia, and hypertension.  Has insulin  pump, last hemoglobin A1c was 8.0.  No recent nausea, vomiting, diarrhea, or significant spikes in sugars.  Taking over-the-counter Mucinex with some temporary relief.   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   Diabetes mellitus due to underlying condition with unspecified complications (HCC) 12/26/2015   Diabetic polyneuropathy associated with type 2 diabetes mellitus (HCC) 08/20/2016   Discitis    Essential hypertension 12/26/2015   Fracture, tibial plateau 12/18/2014   Hypertension    IDDM (insulin  dependent diabetes mellitus)    type 2   Metatarsalgia of both feet 08/20/2016   MI (myocardial infarction) (HCC)    x 2, 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), balloon 2015   Mixed hyperlipidemia 06/14/2020   Obesity (BMI 30.0-34.9) 06/26/2020   Tibial plateau fracture 12/18/2014   Vitamin D  insufficiency     Patient Active Problem List   Diagnosis Date Noted   Encounter for Medicare annual wellness exam 04/01/2023   Encounter for vaccination 04/01/2023   Onychomycosis 04/01/2023   Dislocation of jaw 04/01/2023   Immunization due 04/01/2023   Nail wound of sole of foot 04/01/2023   Episodic cluster headache, intractable 12/17/2022   Severe headache 12/17/2022   Right leg pain 11/11/2022   Primary insomnia 11/11/2022   Type 2 diabetes mellitus with hyperglycemia, with long-term current use of insulin  (HCC) 06/28/2022   Shortness of breath 06/28/2022   Screening for colon cancer 06/28/2022   GERD (gastroesophageal reflux disease) 02/04/2022   BMI 31.0-31.9,adult 04/24/2021   Discitis    MI (myocardial infarction) (HCC)    Mixed hyperlipidemia 06/14/2020   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 07/05/2019   Chip fracture of triquetral bone of right wrist, closed, initial encounter 12/09/2016   Contracture of tendon sheath 08/20/2016   Metatarsalgia of both feet 08/20/2016   Hypertensive heart disease 12/26/2015   Diabetic polyneuropathy (HCC) 12/26/2015   Vitamin D  insufficiency 12/20/2014   CAD (coronary artery disease)    Chronic back pain     Past Surgical History:  Procedure Laterality Date   BACK SURGERY     2014   CARDIAC CATHETERIZATION  x 2, 2009 & 2015   COLONOSCOPY     CORONARY ANGIOPLASTY     stent - 2008, balloon - 2015   FASCIOTOMY Right 12/19/2014   Procedure: ANTERIOR COMPARTMENTAL FASCIOTOMY;  Surgeon: Ozell Bruch, MD;  Location: [facility] OR;  Service: Orthopedics;  Laterality: Right;   HAND SURGERY Left    HARDWARE REMOVAL Right 07/24/2015   Procedure: HARDWARE REMOVAL RIGHT TIBIA;  Surgeon: Ozell Bruch, MD;  Location: [facility] OR;  Service: Orthopedics;  Laterality: Right;    KNEE ARTHROSCOPY Right 07/24/2015   Procedure: RIGHT ARTHROSCOPY KNEE WITH PARTIAL SYNOVECTOMY AND MEDIAL MENISCECTOMY;  Surgeon: Ozell Bruch, MD;  Location: [facility] OR;  Service: Orthopedics;  Laterality: Right;   LEFT HEART CATH AND CORONARY ANGIOGRAPHY N/A 07/02/2020   Procedure: LEFT HEART CATH AND CORONARY ANGIOGRAPHY;  Surgeon: Burnard Debby LABOR, MD;  Location: [facility] INVASIVE [facility] LAB;  Service: Cardiovascular;  Laterality: N/A;   LEFT HEART CATH AND CORONARY ANGIOGRAPHY N/A 01/22/2022   Procedure: LEFT HEART CATH AND CORONARY ANGIOGRAPHY;  Surgeon: Darron Deatrice LABOR, MD;  Location: [facility]VAS[facility] CV LAB;  Service: Cardiovascular;  Laterality: N/A;   ORIF TIBIA PLATEAU Right 12/19/2014   Procedure: OPEN REDUCTION INTERNAL FIXATION (ORIF) RIGHT TIBIAL PLATEAU;  Surgeon: Ozell Bruch, MD;  Location: [facility] OR;  Service: Orthopedics;  Laterality: Right;   TONSILLECTOMY     VASECTOMY         Home Medications    Prior to Admission medications   Medication Sig Start Date End Date Taking? Authorizing Provider  albuterol  (VENTOLIN  HFA) 108 (90 Base) MCG/ACT inhaler Inhale 1-2 puffs into the lungs every 6 (six) hours as needed for wheezing or shortness of breath. 05/19/23  Yes Enedelia Dorna HERO, FNP  benzonatate  (TESSALON ) 100 MG capsule Take 1 capsule (100 mg total) by mouth every 8 (eight) hours. 05/19/23  Yes Enedelia Dorna HERO, FNP  Spacer/Aero-Holding Chambers (AEROCHAMBER PLUS FLO-VU MEDIUM) MISC 1 each by Other route once for 1 dose. 05/19/23 05/19/23 Yes Enedelia Dorna HERO, FNP  aspirin  EC 81 MG tablet Take 81 mg by mouth daily.    [provider]  atorvastatin  (LIPITOR) 80 MG tablet Take 1 tablet (80 mg total) by mouth daily. 12/24/22   Carlin Delon BROCKS, NP  cholecalciferol (VITAMIN D3) 25 MCG (1000 UNIT) tablet Take 1 tablet (1,000 Units total) by mouth daily. 11/14/22   Cox, Abigail, MD  Continuous Glucose Sensor (FREESTYLE LIBRE 14 DAY SENSOR) MISC USE AS DIRECTED EVERY  14  DAYS 04/08/23    Cox, Kirsten, MD  fenofibrate  (TRICOR ) 145 MG tablet TAKE 1 TABLET BY MOUTH AT BEDTIME 02/03/23   Cox, Kirsten, MD  Glucagon  (GVOKE HYPOPEN  2-PACK) 1 MG/0.2ML SOAJ Inject 1 Dose into the skin as needed (hypoglycemia). 12/30/21   Charlene Clotilda PARAS, NP  Insulin  Disposable Pump (OMNIPOD DASH PDM, GEN 4,) KIT 1 each by Does not apply route as directed. 04/01/23   Cox, Abigail, MD  Insulin  Disposable Pump (OMNIPOD DASH PODS, GEN 4,) MISC 1 each by Does not apply route every 3 (three) days. 04/01/23   CoxAbigail, MD  insulin  lispro (HUMALOG ) 100 UNIT/ML injection Check sugars before meals Start 4 U prior to breakfast, lunch, and supper. + 1 u if sugar is greater than 200. + 2 u if sugar between 201-250 + 3 U if sugar between 251-300 + 4 U if sugar between 301-350 + 5 U if sugar between 351-400 + 6 U if sugar > 400. 04/01/23   Sherre Abigail, MD  lisinopril  (  ZESTRIL ) 2.5 MG tablet Take 1 tablet (2.5 mg total) by mouth daily. 04/11/23   CoxAbigail, MD  metFORMIN  (GLUCOPHAGE ) 1000 MG tablet Take 1 tablet (1,000 mg total) by mouth 2 (two) times daily. 03/31/23   CoxAbigail, MD  methocarbamol  (ROBAXIN ) 750 MG tablet Take 1 tablet (750 mg total) by mouth every 6 (six) hours as needed (TMJ). 03/31/23   CoxAbigail, MD  metoprolol  succinate (TOPROL -XL) 50 MG 24 hr tablet Take 1 tablet (50 mg total) by mouth daily. Take with or immediately following a meal. 01/20/23 04/20/23  Carlin Delon BROCKS, NP  mupirocin  ointment (BACTROBAN ) 2 % Apply topically 2 (two) times daily. 04/05/21   Abran Jerilynn Loving, MD  nitroGLYCERIN  (NITROSTAT ) 0.4 MG SL tablet Place 1 tablet (0.4 mg total) under the tongue every 5 (five) minutes as needed for chest pain. 04/13/23   Carlin Delon BROCKS, NP  omeprazole  (PRILOSEC) 40 MG capsule Take 1 capsule (40 mg total) by mouth daily. 02/04/22   Abran Jerilynn Loving, MD  ondansetron  (ZOFRAN ) 4 MG tablet Take 1 tablet (4 mg total) by mouth every 8 (eight) hours as needed for nausea or vomiting.  12/30/21   Charlene Clotilda PARAS, NP  [facility] VERIO test strip 1 each by Other route daily. 05/07/23   Sherre Abigail, MD  pregabalin  (LYRICA ) 100 MG capsule Take 1 capsule by mouth twice daily 04/20/23   Cox, Abigail, MD  pregabalin  (LYRICA ) 75 MG capsule Take 1 capsule (75 mg total) by mouth 2 (two) times daily. 06/24/22   Sherre Abigail, MD  [facility] injection  01/10/22   [provider]  TRESIBA  FLEXTOUCH 100 UNIT/ML FlexTouch Pen INJECT 45 UNITS SUBCUTANEOUSLY ONCE DAILY 11/10/22   Cox, Abigail, MD  vitamin C  (VITAMIN C ) 500 MG tablet Take 1 tablet (500 mg total) by mouth 2 (two) times daily. 12/20/14   Deward Eck, PA-C  zolpidem  (AMBIEN ) 10 MG tablet Take 1 tablet (10 mg total) by mouth at bedtime as needed for sleep. 11/10/22 05/09/23  Sherre Abigail, MD    Family History Family History  Problem Relation Age of Onset   Diabetes Mother    CAD Mother    Liver disease Father     Social History Social History   Tobacco Use   Smoking status: Former    Current packs/day: 0.00    Types: Cigarettes    Quit date: 02/20/2007    Years since quitting: 16.2   Smokeless tobacco: Never  Vaping Use   Vaping status: Never Used  Substance Use Topics   Alcohol use: No    Alcohol/week: 0.0 standard drinks of alcohol    Comment: None in years   Drug use: No     Allergies   Ozempic  (0.25 or 0.5 mg-dose) [semaglutide (0.25 or 0.5mg -dos)]   Review of Systems Review of Systems  Respiratory:  Positive for cough.   Per HPI   Physical Exam Triage Vital Signs ED Triage Vitals  Encounter Vitals Group     BP 05/19/23 0936 (!) 147/64     Systolic BP Percentile --      Diastolic BP Percentile --      Pulse Rate 05/19/23 0936 70     Resp 05/19/23 0936 20     Temp 05/19/23 0936 98.4 F (36.9 C)     Temp Source 05/19/23 0936 Oral     SpO2 05/19/23 0936 96 %     Weight 05/19/23 0938 185 lb (83.9 kg)     Height --  Head Circumference --      Peak Flow --      Pain Score 05/19/23 0937 8      Pain Loc --      Pain Education --      Exclude from Growth Chart --    No data found.  Updated Vital Signs BP (!) 147/64 (BP Location: Right Arm)   Pulse 70   Temp 98.4 F (36.9 C) (Oral)   Resp 20   Wt 185 lb (83.9 kg)   SpO2 96%   BMI 30.79 kg/m   Visual Acuity Right Eye Distance:   Left Eye Distance:   Bilateral Distance:    Right Eye Near:   Left Eye Near:    Bilateral Near:     Physical Exam Vitals and nursing note reviewed.  Constitutional:      Appearance: He is ill-appearing. He is not toxic-appearing.  HENT:     Head: Normocephalic and atraumatic.     Right Ear: Hearing, tympanic membrane, ear canal and external ear normal.     Left Ear: Hearing, tympanic membrane, ear canal and external ear normal.     Nose: Congestion present.     Mouth/Throat:     Lips: Pink.     Mouth: Mucous membranes are moist. No injury or oral lesions.     Dentition: Normal dentition.     Tongue: No lesions.     Pharynx: Oropharynx is clear. Uvula midline. Posterior oropharyngeal erythema present. No pharyngeal swelling, oropharyngeal exudate, uvula swelling or postnasal drip.     Tonsils: No tonsillar exudate.  Eyes:     General: Lids are normal. Vision grossly intact. Gaze aligned appropriately.     Extraocular Movements: Extraocular movements intact.     Conjunctiva/sclera: Conjunctivae normal.  Neck:     Trachea: Trachea and phonation normal.  Cardiovascular:     Rate and Rhythm: Normal rate and regular rhythm.     Heart sounds: Normal heart sounds, S1 normal and S2 normal.  Pulmonary:     Effort: Pulmonary effort is normal. No respiratory distress.     Breath sounds: Normal air entry. Wheezing (Wheezes heard to all lung fields with cough.  Frequent harsh cough elicited with deep inspiration on exam.) present. No rhonchi or rales.     Comments: Decreased breath sounds throughout bilaterally.  Speaking in full sentences without difficulty. Chest:     Chest wall: No  tenderness.  Musculoskeletal:     Cervical back: Neck supple.  Lymphadenopathy:     Cervical: No cervical adenopathy.  Skin:    General: Skin is warm and dry.     Capillary Refill: Capillary refill takes less than 2 seconds.     Findings: No rash.  Neurological:     General: No focal deficit present.     Mental Status: He is alert and oriented to person, place, and time. Mental status is at baseline.     Cranial Nerves: No dysarthria or facial asymmetry.  Psychiatric:        Mood and Affect: Mood normal.        Speech: Speech normal.        Behavior: Behavior normal.        Thought Content: Thought content normal.        Judgment: Judgment normal.      UC Treatments / Results  Labs (all labs ordered are listed, but only abnormal results are displayed) Labs Reviewed  POC COVID19/FLU A&B COMBO    EKG  Radiology No results found.  Procedures Procedures (including critical care time)  Medications Ordered in UC Medications  albuterol  (PROVENTIL ) (2.5 MG/3ML) 0.083% nebulizer solution 2.5 mg (2.5 mg Nebulization Given 05/19/23 1039)  dexamethasone  (DECADRON ) injection 10 mg (10 mg Intramuscular Given 05/19/23 1039)    Initial Impression / Assessment and Plan / UC Course  I have reviewed the triage vital signs and the nursing notes.  Pertinent labs & imaging results that were available during my care of the patient were reviewed by me and considered in my medical decision making (see chart for details).   1. Acute bronchitis, shortness of breath Evaluation suggests viral bronchitis, however chest x-ray ordered given clinical concern for underlying focal consolidation/pneumonia. Staff will call if chest x-ray is abnormal or if results require change in current treatment plan.   Interventions in clinic: albuterol  breathing treatment administered with improvement in lung sounds on reassessment. Dexamethasone  10mg  IM given. Deferred prednisone  burst given diabetes with  insulin  use to avoid hyperglycemia.   Strep/viral testing: POC COVID and flu testing negative.  Recommend treatment with steroid, bronchodilator, cough suppressants for symptomatic relief, and expectorants (mucinex) as needed- see AVS.   Counseled patient on potential for adverse effects with medications prescribed/recommended today, strict ER and return-to-clinic precautions discussed, patient verbalized understanding.     Final Clinical Impressions(s) / UC Diagnoses   Final diagnoses:  Acute bronchitis, unspecified organism  Shortness of breath  Type 2 diabetes mellitus with other specified complication, with long-term current use of insulin  [facility])     Discharge Instructions      You have bronchitis which is inflammation of the upper airways in your lungs due to a virus. The following medicines will help with your symptoms.   - I gave you a steroid injection in clinic to help with inflammation to chest causing bronchitis/wheezing and gave breathing treatment.  - You may use albuterol  inhaler 1 to 2 puffs every 4-6 hours as needed for cough, shortness of breath, and wheezing. - Take cough medicines as needed. - Continue using over the counter medicines as needed as directed. Plain mucinex (guaifenesin) over the counter may further help breakup mucus and help with symptoms.   If you develop any new or worsening symptoms or do not improve in the next 2 to 3 days, please return.  If your symptoms are severe, please go to the emergency room. Follow-up with PCP as needed.     ED Prescriptions     Medication Sig Dispense Auth. Provider   benzonatate  (TESSALON ) 100 MG capsule Take 1 capsule (100 mg total) by mouth every 8 (eight) hours. 21 capsule Enedelia Going M, FNP   albuterol  (VENTOLIN  HFA) 108 (90 Base) MCG/ACT inhaler Inhale 1-2 puffs into the lungs every 6 (six) hours as needed for wheezing or shortness of breath. 8 g Enedelia Going HERO, FNP   Spacer/Aero-Holding  Chambers (AEROCHAMBER PLUS FLO-VU MEDIUM) MISC 1 each by Other route once for 1 dose. 1 each Enedelia Going HERO, FNP      PDMP not reviewed this encounter.   Enedelia Going HERO, FNP 05/19/23 1056    Enedelia Going [place], [place] 05/19/23 1103

## 2023-05-25 ENCOUNTER — Encounter: Payer: Self-pay | Admitting: Podiatry

## 2023-05-28 ENCOUNTER — Telehealth: Payer: Self-pay

## 2023-06-02 ENCOUNTER — Ambulatory Visit (INDEPENDENT_AMBULATORY_CARE_PROVIDER_SITE_OTHER): Payer: PPO

## 2023-06-02 DIAGNOSIS — E1142 Type 2 diabetes mellitus with diabetic polyneuropathy: Secondary | ICD-10-CM | POA: Diagnosis not present

## 2023-06-02 DIAGNOSIS — I739 Peripheral vascular disease, unspecified: Secondary | ICD-10-CM

## 2023-06-02 DIAGNOSIS — M2142 Flat foot [pes planus] (acquired), left foot: Secondary | ICD-10-CM | POA: Diagnosis not present

## 2023-06-02 DIAGNOSIS — M2141 Flat foot [pes planus] (acquired), right foot: Secondary | ICD-10-CM

## 2023-06-02 NOTE — Progress Notes (Addendum)
  Patient presents today to pick up diabetic shoes and insoles.  Patient was dispensed 1 pair of diabetic shoes and 3 pairs of foam casted diabetic insoles. Fit was satisfactory. Instructions for break-in and wear was reviewed and a copy was given to the patient.   Re-appointment for regularly scheduled diabetic foot care visits or if they should experience any trouble with the shoes or insoles.    1/26 re-ordered shoes due to mftr defect  Also sent for prior auth from DOS /14

## 2023-06-09 ENCOUNTER — Other Ambulatory Visit: Payer: Self-pay | Admitting: Family Medicine

## 2023-06-09 ENCOUNTER — Telehealth: Payer: Self-pay | Admitting: Family Medicine

## 2023-06-09 DIAGNOSIS — Z794 Long term (current) use of insulin: Secondary | ICD-10-CM

## 2023-06-09 MED ORDER — INSULIN LISPRO 100 UNIT/ML IJ SOLN: INTRAMUSCULAR | 2 refills | Status: DC

## 2023-06-09 NOTE — Telephone Encounter (Signed)
Copied from CRM [id number]. Topic: Clinical - Medication Refill >> Jun 09, 2023 10:44 AM Bobbye Morton wrote: Most Recent Primary Care Visit:  Provider: CRAFT, BRADY  Department: [facility]  Visit Type: ACUTE  Date: 04/03/2023  Medication: insulin lispro (HUMALOG) 100 UNIT/ML injection  Has the patient contacted their pharmacy? Yes (Agent: If no, request that the patient contact the pharmacy for the refill. If patient does not wish to contact the pharmacy document the reason why and proceed with request.) (Agent: If yes, when and what did the pharmacy advise?)  Is this the correct pharmacy for this prescription? Yes If no, delete pharmacy and type the correct one.  This is the patient's preferred pharmacy:  [facility] [address], [place] - [address] [place] [address] Phone: [phone] Fax: [phone]    Has the prescription been filled recently? No  Is the patient out of the medication? Yes, enough for 3 days. One Omnipod, will be out of Insulin 01/23.   Has the patient been seen for an appointment in the last year OR does the patient have an upcoming appointment? Yes  Can we respond through MyChart? Yes  Agent: Please be advised that Rx refills may take up to 3 business days. We ask that you follow-up with your pharmacy.

## 2023-06-09 NOTE — Telephone Encounter (Signed)
Duplicate request. Medication request already routed to office earlier this morning.   Copied from CRM [id number]. Topic: Clinical - Medication Refill >> Jun 09, 2023 10:44 AM Bobbye Morton wrote: Most Recent Primary Care Visit:  Provider: CRAFT, BRADY  Department: [facility]  Visit Type: ACUTE  Date: 04/03/2023  Medication: insulin lispro (HUMALOG) 100 UNIT/ML injection  Has the patient contacted their pharmacy? Yes (Agent: If no, request that the patient contact the pharmacy for the refill. If patient does not wish to contact the pharmacy document the reason why and proceed with request.) (Agent: If yes, when and what did the pharmacy advise?)  Is this the correct pharmacy for this prescription? Yes If no, delete pharmacy and type the correct one.  This is the patient's preferred pharmacy:  [facility] [address] [place] - [address] [place] [address] Phone: [phone] Fax: [phone]    Has the prescription been filled recently? No  Is the patient out of the medication? Yes, enough for 3 days. One Omnipod, will be out of Insulin 01/23.   Has the patient been seen for an appointment in the last year OR does the patient have an upcoming appointment? Yes  Can we respond through MyChart? Yes  Agent: Please be advised that Rx refills may take up to 3 business days. We ask that you follow-up with your pharmacy.

## 2023-06-09 NOTE — Telephone Encounter (Signed)
Please see note below.   Copied from CRM [id number]. Topic: Clinical - Prescription Issue >> Jun 09, 2023 10:47 AM Ivette P wrote: Reason for CRM: Pt called in because is running out of insulin and would like 2 vile instead of one. Has some qeustions about Insulin as well requesting callback [phone]

## 2023-06-09 NOTE — Telephone Encounter (Signed)
Sent new prescription.Dr. Sedalia Muta

## 2023-06-22 ENCOUNTER — Other Ambulatory Visit: Payer: Self-pay | Admitting: Family Medicine

## 2023-06-23 ENCOUNTER — Other Ambulatory Visit: Payer: Self-pay | Admitting: Family Medicine

## 2023-06-23 DIAGNOSIS — E1165 Type 2 diabetes mellitus with hyperglycemia: Secondary | ICD-10-CM

## 2023-06-30 ENCOUNTER — Telehealth: Payer: Self-pay

## 2023-06-30 NOTE — Telephone Encounter (Signed)
Called pt to tell him he can come pick up his shoes, but will have to leave the other shoes and make sure the inserts are out

## 2023-07-02 ENCOUNTER — Other Ambulatory Visit: Payer: Self-pay | Admitting: Family Medicine

## 2023-07-02 DIAGNOSIS — E1165 Type 2 diabetes mellitus with hyperglycemia: Secondary | ICD-10-CM

## 2023-07-03 ENCOUNTER — Other Ambulatory Visit: Payer: Self-pay

## 2023-07-03 MED ORDER — FENOFIBRATE 145 MG PO TABS: 145.0000 mg | ORAL_TABLET | Freq: Every day | ORAL | 0 refills | Status: DC

## 2023-07-03 MED ORDER — METOPROLOL SUCCINATE ER 50 MG PO TB24: 50.0000 mg | ORAL_TABLET | Freq: Every day | ORAL | 3 refills | Status: DC

## 2023-07-03 MED ORDER — LISINOPRIL 2.5 MG PO TABS: 2.5000 mg | ORAL_TABLET | Freq: Every day | ORAL | 0 refills | Status: DC

## 2023-07-07 ENCOUNTER — Telehealth: Payer: Self-pay

## 2023-07-07 NOTE — Telephone Encounter (Signed)
 Patient came In to pick up his diabetic shoes, he said he would like to order a different pair of shoes as he is not satisfied with the quality of the shoes he ordered. Thanks!

## 2023-07-08 NOTE — Telephone Encounter (Signed)
 Called and spoke with patient placed order for the 2nd choice he had chosen at initial Eval I will take to [facility] when in  Patient can stop in and try

## 2023-07-14 ENCOUNTER — Encounter: Payer: Self-pay | Admitting: Family Medicine

## 2023-07-14 ENCOUNTER — Ambulatory Visit: Payer: PPO | Admitting: Family Medicine

## 2023-07-14 VITALS — BP 136/80 | HR 71 | Temp 98.0°F | Ht 65.0 in | Wt 201.0 lb

## 2023-07-14 DIAGNOSIS — I119 Hypertensive heart disease without heart failure: Secondary | ICD-10-CM

## 2023-07-14 DIAGNOSIS — E1142 Type 2 diabetes mellitus with diabetic polyneuropathy: Secondary | ICD-10-CM

## 2023-07-14 DIAGNOSIS — E782 Mixed hyperlipidemia: Secondary | ICD-10-CM

## 2023-07-14 DIAGNOSIS — K2100 Gastro-esophageal reflux disease with esophagitis, without bleeding: Secondary | ICD-10-CM

## 2023-07-14 DIAGNOSIS — L739 Follicular disorder, unspecified: Secondary | ICD-10-CM

## 2023-07-14 DIAGNOSIS — S0300XD Dislocation of jaw, unspecified side, subsequent encounter: Secondary | ICD-10-CM

## 2023-07-14 MED ORDER — MUPIROCIN 2 % EX OINT: TOPICAL_OINTMENT | Freq: Two times a day (BID) | CUTANEOUS | 6 refills | Status: DC

## 2023-07-14 NOTE — Progress Notes (Unsigned)
 Subjective:  Patient ID: Mitchell Hammond, male    DOB: 1955/07/02  Age: 68 y.o. MRN: [id number]  Chief Complaint  Patient presents with   Medical Management of Chronic Issues    HPI Hyperlipidemia: taking lipitor 40 mg daily, fenofibrate 145 mg daily Not eating healthy. Not eating all day and is starving at night.   GERD: taking omeprazole 40 mg daily.  Hypertensive heart disease:: currently on metoprolol 25 mg daily and lisinopril 2.5 mg daily, hx of heart disease and MI (has 2 stents and a balloon). See's Dr. Josiah Lobo.  Denies any history of congestive heart failure.  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 Last eye exam 03/2023 at [facility] here in [place]. Pt is saying his sugar is high after eats and takes a long time to come down. I am getting his a1c today. he wants to adjust his insulin.  He says he is not eating at times due to high sugars.   Left jaw pain-TMJ. Methocarbamol given last visit. Helps but does not like taking it al lthe time.   Sores on arms BL. Comes and goes. Dr. Marina Goodell uses bactoban which helps.      07/14/2023    8:31 AM 03/31/2023    9:22 AM 11/10/2022   10:10 AM 06/24/2022    8:53 AM 12/25/2021    8:25 AM  Depression screen PHQ 2/9  Decreased Interest 0 0 0 0 0  Down, Depressed, Hopeless 0 0 0 0 0  PHQ - 2 Score 0 0 0 0 0  Altered sleeping 3 2 0  3  Tired, decreased energy 0 1 0  1  Change in appetite 3 0 0  0  Feeling bad or failure about yourself  0 0 0  0  Trouble concentrating 0 0 0  0  Moving slowly or fidgety/restless 0 0 0  0  Suicidal thoughts 0 0 0  0  PHQ-9 Score 6 3 0  4  Difficult doing work/chores Not difficult at all Not difficult at all Not difficult at all  Not difficult at all        03/31/2023    9:22 AM  Fall Risk   Falls in the past year? 0  Number falls in past yr: 0  Injury with Fall? 0  Risk for fall due to : No Fall Risks  Follow up Falls evaluation completed     Patient Care Team: Blane Ohara, MD as PCP - General (Family Medicine) Revankar, Aundra Dubin, MD as PCP - Cardiology (Cardiology) Zettie Pho, [facility] (Inactive) (Pharmacist)   Review of Systems  Constitutional:  Negative for chills, diaphoresis, fatigue and fever.  HENT:  Negative for congestion, ear pain and sore throat.   Respiratory:  Negative for cough and shortness of breath.   Cardiovascular:  Negative for chest pain and leg swelling.  Gastrointestinal:  Negative for abdominal pain, constipation, diarrhea, nausea and vomiting.  Genitourinary:  Negative for dysuria and urgency.  Musculoskeletal:  Negative for arthralgias and myalgias.  Neurological:  Negative for dizziness and headaches.  Psychiatric/Behavioral:  Negative for dysphoric mood.     Current Outpatient Medications on File Prior to Visit  Medication Sig Dispense Refill   aspirin EC 81 MG tablet Take 81 mg by mouth daily.     atorvastatin (LIPITOR) 80 MG tablet Take 1 tablet (80 mg total) by mouth daily. 90 tablet 3   benzonatate (TESSALON) 100 MG capsule Take 1 capsule (100  mg total) by mouth every 8 (eight) hours. 21 capsule 0   cholecalciferol (VITAMIN D3) 25 MCG (1000 UNIT) tablet Take 1 tablet (1,000 Units total) by mouth daily. 30 tablet 1   Continuous Glucose Sensor (FREESTYLE LIBRE 14 DAY SENSOR) MISC USE AS DIRECTED EVERY  14  DAYS 3 each 6   fenofibrate (TRICOR) 145 MG tablet Take 1 tablet (145 mg total) by mouth at bedtime. 90 tablet 0   Glucagon (GVOKE HYPOPEN 2-PACK) 1 MG/0.2ML SOAJ Inject 1 Dose into the skin as needed (hypoglycemia). 0.2 mL 1   Insulin Disposable Pump (OMNIPOD DASH PDM, GEN 4,) KIT 1 each by Does not apply route as directed. 1 kit 0   insulin lispro (HUMALOG) 100 UNIT/ML injection On omnipod. 200 U plus every 2 days. 20 mL 2   lisinopril (ZESTRIL) 2.5 MG tablet Take 1 tablet (2.5 mg total) by mouth daily. 90 tablet 0   metFORMIN (GLUCOPHAGE) 1000 MG tablet Take 1 tablet by mouth twice  daily 180 tablet 0   methocarbamol (ROBAXIN) 750 MG tablet Take 1 tablet (750 mg total) by mouth every 6 (six) hours as needed (TMJ). 120 tablet 2   metoprolol succinate (TOPROL-XL) 50 MG 24 hr tablet Take 1 tablet (50 mg total) by mouth daily. Take with or immediately following a meal. 90 tablet 3   nitroGLYCERIN (NITROSTAT) 0.4 MG SL tablet Place 1 tablet (0.4 mg total) under the tongue every 5 (five) minutes as needed for chest pain. 25 tablet 8   omeprazole (PRILOSEC) 40 MG capsule Take 1 capsule (40 mg total) by mouth daily. 30 capsule 3   ondansetron (ZOFRAN) 4 MG tablet Take 1 tablet (4 mg total) by mouth every 8 (eight) hours as needed for nausea or vomiting. 20 tablet 0   ONETOUCH VERIO test strip 1 each by Other route daily. 100 each 3   pregabalin (LYRICA) 75 MG capsule Take 1 capsule (75 mg total) by mouth 2 (two) times daily. 60 capsule 2   SHINGRIX injection      vitamin C (VITAMIN C) 500 MG tablet Take 1 tablet (500 mg total) by mouth 2 (two) times daily. 60 tablet 2   zolpidem (AMBIEN) 10 MG tablet Take 1 tablet (10 mg total) by mouth at bedtime as needed for sleep. 30 tablet 5   No current facility-administered medications on file prior to visit.   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   Diabetes mellitus due to underlying condition with unspecified complications (HCC) 12/26/2015   Diabetic polyneuropathy associated with type 2 diabetes mellitus (HCC) 08/20/2016   Discitis    Essential hypertension 12/26/2015   Fracture, tibial plateau 12/18/2014   Hypertension    IDDM (insulin dependent diabetes mellitus)    type 2   Metatarsalgia of both feet 08/20/2016   MI (myocardial infarction) (HCC)    x 2, Multi-link Vision  3.5 x 15 mm BMS in 2009 (mid LAD), balloon 2015   Mixed hyperlipidemia 06/14/2020   Obesity (BMI 30.0-34.9) 06/26/2020   Tibial plateau fracture 12/18/2014   Vitamin D insufficiency    Past Surgical History:  Procedure Laterality Date   BACK SURGERY     2014   CARDIAC CATHETERIZATION  x 2, 2009 & 2015   COLONOSCOPY     CORONARY ANGIOPLASTY     stent - 2008, balloon - 2015   FASCIOTOMY Right 12/19/2014   Procedure: ANTERIOR COMPARTMENTAL FASCIOTOMY;  Surgeon: Myrene Galas, MD;  Location: [facility] OR;  Service: Orthopedics;  Laterality: Right;   HAND SURGERY Left    HARDWARE REMOVAL Right 07/24/2015   Procedure: HARDWARE REMOVAL RIGHT TIBIA;  Surgeon: Myrene Galas, MD;  Location: [facility] OR;  Service: Orthopedics;  Laterality: Right;   KNEE ARTHROSCOPY Right 07/24/2015   Procedure: RIGHT ARTHROSCOPY KNEE WITH PARTIAL SYNOVECTOMY AND MEDIAL MENISCECTOMY;  Surgeon: Myrene Galas, MD;  Location: [facility] OR;  Service: Orthopedics;  Laterality: Right;   LEFT HEART CATH AND CORONARY ANGIOGRAPHY N/A 07/02/2020   Procedure: LEFT HEART CATH AND CORONARY ANGIOGRAPHY;  Surgeon: Lennette Bihari, MD;  Location: [facility] INVASIVE CV LAB;  Service: Cardiovascular;  Laterality: N/A;   LEFT HEART CATH AND CORONARY ANGIOGRAPHY N/A 01/22/2022   Procedure: LEFT HEART CATH AND CORONARY ANGIOGRAPHY;  Surgeon: Iran Ouch, MD;  Location: [facility] INVASIVE CV LAB;  Service: Cardiovascular;  Laterality: N/A;   ORIF TIBIA PLATEAU Right 12/19/2014   Procedure: OPEN REDUCTION INTERNAL FIXATION (ORIF) RIGHT TIBIAL PLATEAU;  Surgeon: Myrene Galas, MD;  Location: [facility] OR;  Service: Orthopedics;  Laterality: Right;   TONSILLECTOMY     VASECTOMY      Family History  Problem Relation Age of Onset   Diabetes Mother    CAD Mother    Liver disease Father    Social History   Socioeconomic History   Marital status: Married    Spouse name: Not on file   Number of children: 2   Years of education: Not on file   Highest education level: Not on file   Occupational History   Occupation: disabled  Tobacco Use   Smoking status: Former    Current packs/day: 0.00    Types: Cigarettes    Quit date: 02/20/2007    Years since quitting: 16.4   Smokeless tobacco: Never  Vaping Use   Vaping status: Never Used  Substance and Sexual Activity   Alcohol use: No    Alcohol/week: 0.0 standard drinks of alcohol    Comment: None in years   Drug use: No   Sexual activity: Yes    Partners: Female  Other Topics Concern   Not on file  Social History Narrative   Pt lives with wife and daughter in [place].    Social Drivers of Corporate investment banker Strain: Low Risk  (11/14/2021)   Overall Financial Resource Strain (CARDIA)    Difficulty of Paying Living Expenses: Not hard at all  Food Insecurity: No Food Insecurity (08/29/2020)   Hunger Vital Sign    Worried About Running Out of Food in the Last Year: Never true    Ran Out of Food in the Last Year: Never true  Transportation Needs: No Transportation Needs (11/14/2021)   PRAPARE - Administrator, Civil Service (Medical): No    Lack of Transportation (Non-Medical): No  Physical Activity: Inactive (12/17/2022)   Exercise Vital Sign    Days of Exercise per Week: 0 days    Minutes of Exercise per Session: 0 min  Stress: No Stress Concern Present (06/24/2022)   [employer] of Occupational Health - Occupational Stress Questionnaire    Feeling of Stress : Not at all  Social Connections: Moderately Integrated (06/24/2022)   Social Connection and Isolation Panel [NHANES]  Frequency of Communication with Friends and Family: More than three times a week    Frequency of Social Gatherings with Friends and Family: More than three times a week    Attends Religious Services: More than 4 times per year    Active Member of [employer] or Organizations: No    Attends Engineer, structural: Never    Marital Status: Married    Objective:  BP 136/80   Pulse 71   Temp 98 F (36.7 C)    Ht 5\' 5"  (1.651 m)   Wt 201 lb (91.2 kg)   SpO2 98%   BMI 33.45 kg/m      07/14/2023    8:30 AM 05/19/2023    9:38 AM 05/19/2023    9:36 AM  BP/Weight  Systolic BP 136  409  Diastolic BP 80  64  Wt. (Lbs) 201 185   BMI 33.45 kg/m2 30.79 kg/m2     Physical Exam Vitals reviewed.  Constitutional:      Appearance: Normal appearance.  Neck:     Vascular: No carotid bruit.  Cardiovascular:     Rate and Rhythm: Normal rate and regular rhythm.     Pulses: Normal pulses.     Heart sounds: Normal heart sounds.  Pulmonary:     Effort: Pulmonary effort is normal.     Breath sounds: Normal breath sounds. No wheezing, rhonchi or rales.  Abdominal:     General: Bowel sounds are normal.     Palpations: Abdomen is soft.     Tenderness: There is no abdominal tenderness.  Neurological:     Mental Status: He is alert and oriented to person, place, and time.  Psychiatric:        Mood and Affect: Mood normal.        Behavior: Behavior normal.     Diabetic Foot Exam - Simple   Simple Foot Form  07/14/2023  9:52 PM  Visual Inspection No deformities, no ulcerations, no other skin breakdown bilaterally: Yes Sensation Testing Intact to touch and monofilament testing bilaterally: Yes Pulse Check Posterior Tibialis and Dorsalis pulse intact bilaterally: Yes Comments      Lab Results  Component Value Date   WBC 5.5 07/14/2023   HGB 15.6 07/14/2023   HCT 46.7 07/14/2023   PLT 166 07/14/2023   GLUCOSE 210 (H) 07/14/2023   CHOL 179 07/14/2023   TRIG 165 (H) 07/14/2023   HDL 30 (L) 07/14/2023   LDLCALC 119 (H) 07/14/2023   ALT 35 07/14/2023   AST 24 07/14/2023   NA 139 07/14/2023   K 4.4 07/14/2023   CL 102 07/14/2023   CREATININE 0.94 07/14/2023   BUN 15 07/14/2023   CO2 21 07/14/2023   TSH 2.829 12/18/2014   INR 1.20 12/18/2014   HGBA1C 8.9 (H) 07/14/2023   MICROALBUR 80 06/14/2020      Assessment & Plan:    Hypertensive heart disease without heart  failure Assessment & Plan: Well controlled.  No medication changes recommended. currently on metoprolol 25 mg daily and lisinopril 2.5 mg daily. Continue healthy diet and exercise.   Labs drawn today  Orders: -     CBC with Differential/Platelet -     Comprehensive metabolic panel  Gastroesophageal reflux disease with esophagitis without hemorrhage Assessment & Plan: Continue with Omeprazole 40 mg daily.   Diabetic polyneuropathy associated with type 2 diabetes mellitus (HCC) Assessment & Plan: Control: Uncotrolled Recommend check sugars fasting daily. Recommend check feet daily. Recommend annual eye exams. Medicines:  Metformin 1000 mg twice daily  and omnipod Continue to work on eating a healthy diet and exercise.  Labs drawn today.       Orders: -     Hemoglobin A1c -     Microalbumin / creatinine urine ratio  Mixed hyperlipidemia Assessment & Plan: Well controlled.  No changes to medicines. lipitor 40 mg daily, fenofibrate 145 mg daily Continue to work on eating a healthy diet and exercise.  Labs drawn today.    Orders: -     Lipid panel  Folliculitis -     Mupirocin; Apply topically 2 (two) times daily.  Dispense: 22 g; Refill: 6  Dislocation of temporomandibular joint, subsequent encounter Assessment & Plan: Current on methocarbamol 750 mg four times a day as needed TMJ.  Exercises given.      Meds ordered this encounter  Medications   mupirocin ointment (BACTROBAN) 2 %    Sig: Apply topically 2 (two) times daily.    Dispense:  22 g    Refill:  6    Orders Placed This Encounter  Procedures   CBC with Differential/Platelet   Comprehensive metabolic panel   Hemoglobin A1c   Lipid panel   Microalbumin / creatinine urine ratio     Follow-up: No follow-ups on file.   I,[name] I Leal-Borjas,acting as a scribe for Blane Ohara, MD.,have documented all relevant documentation on the behalf of Blane Ohara, MD,as directed by  Blane Ohara, MD while in  the presence of Blane Ohara, MD.   An After Visit Summary was printed and given to the patient.  Blane Ohara, MD [facility] [phone]

## 2023-07-14 NOTE — Assessment & Plan Note (Signed)
Well controlled.  No changes to medicines. lipitor 40 mg daily, fenofibrate 145 mg daily Continue to work on eating a healthy diet and exercise.  Labs drawn today.   

## 2023-07-14 NOTE — Assessment & Plan Note (Signed)
 Well controlled.  No medication changes recommended. currently on metoprolol 25 mg daily and lisinopril 2.5 mg daily. Continue healthy diet and exercise.   Labs drawn today

## 2023-07-15 ENCOUNTER — Telehealth: Payer: Self-pay

## 2023-07-15 LAB — COMPREHENSIVE METABOLIC PANEL WITH GFR
ALT: 35 [IU]/L (ref 0–44)
AST: 24 [IU]/L (ref 0–40)
Albumin: 4.2 g/dL (ref 3.9–4.9)
Alkaline Phosphatase: 87 [IU]/L (ref 44–121)
BUN/Creatinine Ratio: 16 (ref 10–24)
BUN: 15 mg/dL (ref 8–27)
Bilirubin Total: 0.8 mg/dL (ref 0.0–1.2)
CO2: 21 mmol/L (ref 20–29)
Calcium: 9.1 mg/dL (ref 8.6–10.2)
Chloride: 102 mmol/L (ref 96–106)
Creatinine, Ser: 0.94 mg/dL (ref 0.76–1.27)
Globulin, Total: 1.8 g/dL (ref 1.5–4.5)
Glucose: 210 mg/dL — ABNORMAL HIGH (ref 70–99)
Potassium: 4.4 mmol/L (ref 3.5–5.2)
Sodium: 139 mmol/L (ref 134–144)
Total Protein: 6 g/dL (ref 6.0–8.5)
eGFR: 88 mL/min/{1.73_m2}

## 2023-07-15 LAB — CBC WITH DIFFERENTIAL/PLATELET
Basophils Absolute: 0 10*3/uL (ref 0.0–0.2)
Basos: 1 %
EOS (ABSOLUTE): 0.1 10*3/uL (ref 0.0–0.4)
Eos: 2 %
Hematocrit: 46.7 % (ref 37.5–51.0)
Hemoglobin: 15.6 g/dL (ref 13.0–17.7)
Immature Grans (Abs): 0 10*3/uL (ref 0.0–0.1)
Immature Granulocytes: 0 %
Lymphocytes Absolute: 1 10*3/uL (ref 0.7–3.1)
Lymphs: 18 %
MCH: 28.7 pg (ref 26.6–33.0)
MCHC: 33.4 g/dL (ref 31.5–35.7)
MCV: 86 fL (ref 79–97)
Monocytes Absolute: 0.5 10*3/uL (ref 0.1–0.9)
Monocytes: 10 %
Neutrophils Absolute: 3.8 10*3/uL (ref 1.4–7.0)
Neutrophils: 69 %
Platelets: 166 10*3/uL (ref 150–450)
RBC: 5.44 x10E6/uL (ref 4.14–5.80)
RDW: 13.1 % (ref 11.6–15.4)
WBC: 5.5 10*3/uL (ref 3.4–10.8)

## 2023-07-15 LAB — HEMOGLOBIN A1C
Est. average glucose Bld gHb Est-mCnc: 209 mg/dL
Hgb A1c MFr Bld: 8.9 % — ABNORMAL HIGH (ref 4.8–5.6)

## 2023-07-15 LAB — LIPID PANEL
Chol/HDL Ratio: 6 ratio — ABNORMAL HIGH (ref 0.0–5.0)
Cholesterol, Total: 179 mg/dL (ref 100–199)
HDL: 30 mg/dL — ABNORMAL LOW
LDL Chol Calc (NIH): 119 mg/dL — ABNORMAL HIGH (ref 0–99)
Triglycerides: 165 mg/dL — ABNORMAL HIGH (ref 0–149)
VLDL Cholesterol Cal: 30 mg/dL (ref 5–40)

## 2023-07-15 LAB — MICROALBUMIN / CREATININE URINE RATIO
Creatinine, Urine: 112.7 mg/dL
Microalb/Creat Ratio: 169 mg/g{creat} — ABNORMAL HIGH (ref 0–29)
Microalbumin, Urine: 190.6 ug/mL

## 2023-07-15 NOTE — Telephone Encounter (Signed)
 Spoke with pt told him he can pick up his shoes in [place].

## 2023-07-16 ENCOUNTER — Encounter: Payer: Self-pay | Admitting: Family Medicine

## 2023-07-16 NOTE — Telephone Encounter (Signed)
 Pt came by and picked up new pair of shoes and dropped off the old ones, I will bring them back with me to [employer] tomorrow.

## 2023-07-18 NOTE — Assessment & Plan Note (Signed)
Continue with Omeprazole 40 mg daily. 

## 2023-07-18 NOTE — Assessment & Plan Note (Signed)
 Control: Uncotrolled Recommend check sugars fasting daily. Recommend check feet daily. Recommend annual eye exams. Medicines:  Metformin 1000 mg twice daily  and omnipod Continue to work on eating a healthy diet and exercise.  Labs drawn today.

## 2023-07-18 NOTE — Assessment & Plan Note (Signed)
 Current on methocarbamol 750 mg four times a day as needed TMJ.  Exercises given.

## 2023-07-19 ENCOUNTER — Other Ambulatory Visit: Payer: Self-pay | Admitting: Family Medicine

## 2023-07-23 ENCOUNTER — Ambulatory Visit (HOSPITAL_BASED_OUTPATIENT_CLINIC_OR_DEPARTMENT_OTHER): Admission: EM | Admit: 2023-07-23 | Discharge: 2023-07-23 | Disposition: A | Discharge status: Home / Self Care

## 2023-07-23 ENCOUNTER — Other Ambulatory Visit: Payer: Self-pay

## 2023-07-23 ENCOUNTER — Encounter (HOSPITAL_BASED_OUTPATIENT_CLINIC_OR_DEPARTMENT_OTHER): Payer: Self-pay | Admitting: Emergency Medicine

## 2023-07-23 DIAGNOSIS — T7840XA Allergy, unspecified, initial encounter: Secondary | ICD-10-CM | POA: Diagnosis not present

## 2023-07-23 NOTE — Discharge Instructions (Signed)
 I believe this is an allergic reaction Take zyrtec daily 10 mg OTC Go to the ER for worsening symptoms.

## 2023-07-23 NOTE — ED Triage Notes (Signed)
 Pt states he woke up today with left eye swollen and bilateral hand numbness and tingling. Pt states Last time feeling well today at 4 am when he went to bathroom.

## 2023-07-23 NOTE — ED Provider Notes (Signed)
 Mitchell Hammond CARE    CSN: [id number] Arrival date & time: 07/23/23  0929      History   Chief Complaint Chief Complaint  Patient presents with   Facial Swelling   Numbness    HPI Mitchell Hammond is a 68 y.o. male.   68 year old male with significant past medical history.  Reporting woke up this morning with left eye swelling/facial swelling.  Has improved since this morning.  Reports also just overall not feeling well and some numbness and tingling in his fingers.  Denies any pain in the eye, blurry vision, weakness in extremities.  No fevers or chills, body aches, night sweats, cough, chest pain or shortness of breath. Denies any new medications, new foods contact with any new products.     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   Diabetes mellitus due to underlying condition with unspecified complications (HCC) 12/26/2015   Diabetic polyneuropathy associated with type 2 diabetes mellitus (HCC) 08/20/2016   Discitis    Essential hypertension 12/26/2015   Fracture, tibial plateau 12/18/2014   Hypertension    IDDM (insulin dependent diabetes mellitus)    type 2   Metatarsalgia of both feet 08/20/2016   MI (myocardial infarction) (HCC)    x 2, Multi-link Vision 3.5 x 15 mm BMS in 2009 (mid LAD), balloon 2015   Mixed hyperlipidemia 06/14/2020   Obesity (BMI 30.0-34.9) 06/26/2020   Tibial plateau fracture 12/18/2014   Vitamin D insufficiency     Patient Active Problem List   Diagnosis Date Noted   Onychomycosis 04/01/2023   Dislocation of jaw 04/01/2023   Nail wound of sole of foot 04/01/2023   Episodic cluster headache, intractable 12/17/2022   Severe headache 12/17/2022   Right leg pain 11/11/2022   Primary  insomnia 11/11/2022   Type 2 diabetes mellitus with hyperglycemia, with long-term current use of insulin (HCC) 06/28/2022   Shortness of breath 06/28/2022   Screening for colon cancer 06/28/2022   GERD (gastroesophageal reflux disease) 02/04/2022   BMI 31.0-31.9,adult 04/24/2021   Discitis    MI (myocardial infarction) (HCC)    Mixed hyperlipidemia 06/14/2020   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 07/05/2019   Chip fracture of triquetral bone of right wrist, closed, initial encounter 12/09/2016   Contracture of tendon sheath 08/20/2016   Metatarsalgia of both feet 08/20/2016   Hypertensive heart disease 12/26/2015   Diabetic polyneuropathy (HCC) 12/26/2015   Vitamin D insufficiency 12/20/2014   CAD (coronary artery disease)    Chronic back pain     Past Surgical History:  Procedure Laterality Date   BACK SURGERY     2014   CARDIAC CATHETERIZATION     x 2, 2009 & 2015   COLONOSCOPY     CORONARY ANGIOPLASTY     stent - 2008, balloon - 2015   FASCIOTOMY Right 12/19/2014   Procedure: ANTERIOR COMPARTMENTAL FASCIOTOMY;  Surgeon: Myrene Galas, MD;  Location: [facility] OR;  Service: Orthopedics;  Laterality: Right;   HAND SURGERY Left    HARDWARE REMOVAL Right 07/24/2015   Procedure: HARDWARE REMOVAL RIGHT TIBIA;  Surgeon: Myrene Galas, MD;  Location: [facility] OR;  Service: Orthopedics;  Laterality: Right;   KNEE ARTHROSCOPY Right 07/24/2015  Procedure: RIGHT ARTHROSCOPY KNEE WITH PARTIAL SYNOVECTOMY AND MEDIAL MENISCECTOMY;  Surgeon: Myrene Galas, MD;  Location: [facility] OR;  Service: Orthopedics;  Laterality: Right;   LEFT HEART CATH AND CORONARY ANGIOGRAPHY N/A 07/02/2020   Procedure: LEFT HEART CATH AND CORONARY ANGIOGRAPHY;  Surgeon: Lennette Bihari, MD;  Location: [facility] INVASIVE CV LAB;  Service: Cardiovascular;  Laterality: N/A;   LEFT HEART CATH AND CORONARY ANGIOGRAPHY N/A 01/22/2022   Procedure: LEFT HEART CATH AND CORONARY ANGIOGRAPHY;  Surgeon: Iran Ouch, MD;  Location: [facility] INVASIVE CV  LAB;  Service: Cardiovascular;  Laterality: N/A;   ORIF TIBIA PLATEAU Right 12/19/2014   Procedure: OPEN REDUCTION INTERNAL FIXATION (ORIF) RIGHT TIBIAL PLATEAU;  Surgeon: Myrene Galas, MD;  Location: [facility] OR;  Service: Orthopedics;  Laterality: Right;   TONSILLECTOMY     VASECTOMY         Home Medications    Prior to Admission medications   Medication Sig Start Date End Date Taking? Authorizing Provider  aspirin EC 81 MG tablet Take 81 mg by mouth daily.    [provider]  atorvastatin (LIPITOR) 80 MG tablet Take 1 tablet (80 mg total) by mouth daily. 12/24/22   Flossie Dibble, NP  benzonatate (TESSALON) 100 MG capsule Take 1 capsule (100 mg total) by mouth every 8 (eight) hours. 05/19/23   Carlisle Beers, FNP  cholecalciferol (VITAMIN D3) 25 MCG (1000 UNIT) tablet Take 1 tablet (1,000 Units total) by mouth daily. 11/14/22   Cox, Fritzi Mandes, MD  Continuous Glucose Sensor (FREESTYLE LIBRE 14 DAY SENSOR) MISC USE AS DIRECTED EVERY  14  DAYS 04/08/23   Cox, Fritzi Mandes, MD  fenofibrate (TRICOR) 145 MG tablet Take 1 tablet (145 mg total) by mouth at bedtime. 07/03/23   Cox, Fritzi Mandes, MD  Glucagon (GVOKE HYPOPEN 2-PACK) 1 MG/0.2ML SOAJ Inject 1 Dose into the skin as needed (hypoglycemia). 12/30/21   Janie Morning, NP  Insulin Disposable Pump (OMNIPOD DASH PDM, GEN 4,) KIT 1 each by Does not apply route as directed. 04/01/23   Cox, Fritzi Mandes, MD  Insulin Disposable Pump (OMNIPOD DASH PODS, GEN 4,) MISC CHANGE EVERY 3 DAYS 07/19/23   Cox, Kirsten, MD  insulin lispro (HUMALOG) 100 UNIT/ML injection On omnipod. 200 U plus every 2 days. 06/09/23   Cox, Fritzi Mandes, MD  lisinopril (ZESTRIL) 2.5 MG tablet Take 1 tablet (2.5 mg total) by mouth daily. 07/03/23   Blane Ohara, MD  metFORMIN (GLUCOPHAGE) 1000 MG tablet Take 1 tablet by mouth twice daily 06/23/23   Cox, Kirsten, MD  methocarbamol (ROBAXIN) 750 MG tablet Take 1 tablet (750 mg total) by mouth every 6 (six) hours as needed (TMJ). 03/31/23   CoxFritzi Mandes, MD  metoprolol succinate (TOPROL-XL) 50 MG 24 hr tablet Take 1 tablet (50 mg total) by mouth daily. Take with or immediately following a meal. 07/03/23 10/01/23  Flossie Dibble, NP  mupirocin ointment (BACTROBAN) 2 % Apply topically 2 (two) times daily. 07/14/23   CoxFritzi Mandes, MD  nitroGLYCERIN (NITROSTAT) 0.4 MG SL tablet Place 1 tablet (0.4 mg total) under the tongue every 5 (five) minutes as needed for chest pain. 04/13/23   Flossie Dibble, NP  omeprazole (PRILOSEC) 40 MG capsule Take 1 capsule (40 mg total) by mouth daily. 02/04/22   Abigail Miyamoto, MD  ondansetron (ZOFRAN) 4 MG tablet Take 1 tablet (4 mg total) by mouth every 8 (eight) hours as needed for nausea or vomiting. 12/30/21   Janie Morning, NP  Lum Babe  test strip 1 each by Other route daily. 05/07/23   Cox, Fritzi Mandes, MD  pregabalin (LYRICA) 75 MG capsule Take 1 capsule (75 mg total) by mouth 2 (two) times daily. 06/24/22   Blane Ohara, MD  [facility] injection  01/10/22   [provider]  vitamin C (VITAMIN C) 500 MG tablet Take 1 tablet (500 mg total) by mouth 2 (two) times daily. 12/20/14   Montez Morita, PA-C  zolpidem (AMBIEN) 10 MG tablet Take 1 tablet (10 mg total) by mouth at bedtime as needed for sleep. 11/10/22 05/09/23  Blane Ohara, MD    Family History Family History  Problem Relation Age of Onset   Diabetes Mother    CAD Mother    Liver disease Father     Social History Social History   Tobacco Use   Smoking status: Former    Current packs/day: 0.00    Types: Cigarettes    Quit date: 02/20/2007    Years since quitting: 16.4   Smokeless tobacco: Never  Vaping Use   Vaping status: Never Used  Substance Use Topics   Alcohol use: No    Alcohol/week: 0.0 standard drinks of alcohol    Comment: None in years   Drug use: No     Allergies   Ozempic (0.25 or 0.5 mg-dose) [semaglutide(0.25 or 0.5mg -dos)]   Review of Systems Review of Systems  Neurological:  Positive for  numbness.     Physical Exam Triage Vital Signs ED Triage Vitals  Encounter Vitals Group     BP 07/23/23 1008 (!) 185/79     Systolic BP Percentile --      Diastolic BP Percentile --      Pulse Rate 07/23/23 1008 63     Resp 07/23/23 1008 18     Temp 07/23/23 1008 98 F (36.7 C)     Temp Source 07/23/23 1008 Oral     SpO2 07/23/23 1008 96 %     Weight --      Height --      Head Circumference --      Peak Flow --      Pain Score 07/23/23 1009 0     Pain Loc --      Pain Education --      Exclude from Growth Chart --    No data found.  Updated Vital Signs BP (!) 185/79 (BP Location: Right Arm)   Pulse 63   Temp 98 F (36.7 C) (Oral)   Resp 18   SpO2 96%   Visual Acuity Right Eye Distance:   Left Eye Distance:   Bilateral Distance:    Right Eye Near:   Left Eye Near:    Bilateral Near:     Physical Exam Constitutional:      General: He is not in acute distress.    Appearance: Normal appearance. He is not ill-appearing.  HENT:     Head: Normocephalic and atraumatic.     Right Ear: Tympanic membrane normal.     Left Ear: Tympanic membrane normal.     Nose: Nose normal.     Mouth/Throat:     Pharynx: Oropharynx is clear.  Eyes:     Extraocular Movements: Extraocular movements intact.     Conjunctiva/sclera: Conjunctivae normal.     Pupils: Pupils are equal, round, and reactive to light.     Comments: Mild left eye upper lid swelling  Cardiovascular:     Rate and Rhythm: Normal rate and regular rhythm.  Pulmonary:  Effort: Pulmonary effort is normal.     Breath sounds: Normal breath sounds.  Musculoskeletal:        General: Normal range of motion.  Skin:    General: Skin is warm and dry.  Neurological:     General: No focal deficit present.     Mental Status: He is alert.     Cranial Nerves: No cranial nerve deficit.     Motor: No weakness.  Psychiatric:        Mood and Affect: Mood normal.        Behavior: Behavior normal.      UC  Treatments / Results  Labs (all labs ordered are listed, but only abnormal results are displayed) Labs Reviewed - No data to display  EKG   Radiology No results found.  Procedures Procedures (including critical care time)  Medications Ordered in UC Medications - No data to display  Initial Impression / Assessment and Plan / UC Course  I have reviewed the triage vital signs and the nursing notes.  Pertinent labs & imaging results that were available during my care of the patient were reviewed by me and considered in my medical decision making (see chart for details).     Allergic reaction-most likely cause based on symptoms.  Not sure if this is some sort of reaction to allergen outside or inside. No concerns on exam and no focal neurodeficits. Recommended Zyrtec daily for symptoms.  If symptoms worsen recommend follow-up with his doctor or go to the ER. Final Clinical Impressions(s) / UC Diagnoses   Final diagnoses:  Allergic reaction, initial encounter     Discharge Instructions      I believe this is an allergic reaction Take zyrtec daily 10 mg OTC Go to the ER for worsening symptoms.     ED Prescriptions   None    PDMP not reviewed this encounter.   Janace Aris, FNP 07/23/23 1506

## 2023-08-11 ENCOUNTER — Ambulatory Visit (INDEPENDENT_AMBULATORY_CARE_PROVIDER_SITE_OTHER): Payer: PPO | Admitting: Podiatry

## 2023-08-11 DIAGNOSIS — B351 Tinea unguium: Secondary | ICD-10-CM

## 2023-08-11 DIAGNOSIS — M79674 Pain in right toe(s): Secondary | ICD-10-CM

## 2023-08-11 DIAGNOSIS — M79675 Pain in left toe(s): Secondary | ICD-10-CM | POA: Diagnosis not present

## 2023-08-11 DIAGNOSIS — E1142 Type 2 diabetes mellitus with diabetic polyneuropathy: Secondary | ICD-10-CM | POA: Diagnosis not present

## 2023-08-11 MED ORDER — CICLOPIROX 8 % EX SOLN: Freq: Every day | CUTANEOUS | 0 refills | Status: DC

## 2023-08-11 NOTE — Progress Notes (Unsigned)
  Subjective:  Patient ID: Mitchell Hammond, male    DOB: 1955/10/01,  MRN: [id number]  Chief Complaint  Patient presents with   [facility]    [facility]  today with out callous. Last A1c was in Feb it was 8.9 and he takes ASA 81. Would like something oral for nail fungus bc the OTC stuff doe not work well.     68 y.o. male presents with the above complaint. History confirmed with patient. Patient presenting with pain related to dystrophic thickened elongated nails. Patient is unable to trim own nails related to nail dystrophy and/or mobility issues. Patient does have a history of T2DM.  Last A1c was 8.9.  Patient does endorse numbness and tingling to the toes.  Objective:  Physical Exam: warm to cool, good capillary refill to the digits, pedal hair growth absent.  Brawny shiny skin changes to anterior shins. nail exam onychomycosis of the toenails, dystrophic nails, with yellow discoloration and subungual debris DP pulses faintly palpable, PT pulses nonpalpable, protective sensation absent, and vibratory sensation absent.  Endorses subjective neuropathy symptoms Left Foot:  Pain with palpation of nails due to elongation and dystrophic growth.  Right Foot: Pain with palpation of nails due to elongation and dystrophic growth.  Bilateral anterior shins there is stable pinpoint scab, nonhealing. Mild bunion and hammertoe contractures bilaterally. Assessment:   1. Diabetic polyneuropathy associated with type 2 diabetes mellitus (HCC)   2. Pain due to onychomycosis of toenails of both feet      Plan:  Patient was evaluated and treated and all questions answered.   #Onychomycosis with pain  -Nails palliatively debrided as below. -Educated on self-care -He is requesting pharmacological treatment to help with this.  Nail specimen obtained today.  Starting patient on [place].  Procedure: Nail Debridement Rationale: Pain Type of Debridement: manual, sharp debridement. Instrumentation: Nail nipper, rotary  burr. Number of Nails: 10  # Diabetes with neuropathy - Patient educated on diabetes. Discussed proper diabetic foot care and discussed risks and complications of disease. Educated patient in depth on reasons to return to the office immediately should he/she discover anything concerning or new on the feet. All questions answered. Discussed proper shoes as well.   - Doing well with diabetic shoes at this point -Discussed potentially getting Qutenza approved for patient.  He would need referral from his PCP for this.   Return in about 3 months (around 11/11/2023) for Diabetic Foot Care.         Bronwen Betters, DPM [facility] / [facility]

## 2023-08-13 ENCOUNTER — Telehealth: Payer: Self-pay

## 2023-08-13 NOTE — Telephone Encounter (Signed)
 PA request received from covermymeds for ciclopirox 8% solution. PA submitted through [employer]  (Key: [facility]) PA Case ID #: [id number] Rx #: [id number]

## 2023-08-18 ENCOUNTER — Other Ambulatory Visit: Payer: Self-pay | Admitting: Podiatry

## 2023-08-20 ENCOUNTER — Other Ambulatory Visit: Payer: Self-pay | Admitting: Family Medicine

## 2023-08-22 ENCOUNTER — Other Ambulatory Visit: Payer: Self-pay | Admitting: Family Medicine

## 2023-08-22 DIAGNOSIS — E1165 Type 2 diabetes mellitus with hyperglycemia: Secondary | ICD-10-CM

## 2023-08-24 NOTE — Telephone Encounter (Signed)
 PA was denied

## 2023-08-27 NOTE — Telephone Encounter (Signed)
 Initial denial upheld. No documentation of trial and failure of oral terbinafine. Appeal was done.

## 2023-09-15 ENCOUNTER — Ambulatory Visit: Payer: PPO

## 2023-09-16 ENCOUNTER — Other Ambulatory Visit: Payer: Self-pay | Admitting: Family Medicine

## 2023-09-16 ENCOUNTER — Other Ambulatory Visit: Payer: Self-pay | Admitting: Podiatry

## 2023-09-16 MED ORDER — OMNIPOD DASH PODS (GEN 4) MISC
1.0000 | 0 refills | Status: AC
Start: 2023-09-16 — End: ?

## 2023-09-16 MED ORDER — CICLOPIROX 8 % EX SOLN: Freq: Every day | CUTANEOUS | 0 refills | Status: AC

## 2023-09-17 ENCOUNTER — Other Ambulatory Visit: Payer: Self-pay | Admitting: Family Medicine

## 2023-09-17 DIAGNOSIS — Z794 Long term (current) use of insulin: Secondary | ICD-10-CM

## 2023-09-30 ENCOUNTER — Other Ambulatory Visit: Payer: Self-pay | Admitting: Podiatry

## 2023-09-30 ENCOUNTER — Other Ambulatory Visit: Payer: Self-pay | Admitting: Family Medicine

## 2023-09-30 DIAGNOSIS — E1142 Type 2 diabetes mellitus with diabetic polyneuropathy: Secondary | ICD-10-CM

## 2023-10-01 ENCOUNTER — Other Ambulatory Visit: Payer: Self-pay | Admitting: Podiatry

## 2023-10-01 DIAGNOSIS — E1142 Type 2 diabetes mellitus with diabetic polyneuropathy: Secondary | ICD-10-CM

## 2023-10-01 MED ORDER — QUTENZA (4 PATCH) 8 % EX KIT: 4.0000 | PACK | Freq: Once | CUTANEOUS | 0 refills | Status: AC

## 2023-10-01 NOTE — Progress Notes (Signed)
 Order for Qutenza placed to specialty pharmacy.

## 2023-10-02 ENCOUNTER — Other Ambulatory Visit: Payer: Self-pay

## 2023-10-02 DIAGNOSIS — Z794 Long term (current) use of insulin: Secondary | ICD-10-CM

## 2023-10-13 MED ORDER — QUTENZA 8 % EX KIT: 4.0000 | PACK | Freq: Once | CUTANEOUS | 2 refills | Status: AC

## 2023-10-13 NOTE — Progress Notes (Signed)
 Qutenza  sent in for patient

## 2023-10-14 ENCOUNTER — Other Ambulatory Visit: Payer: Self-pay | Admitting: Family Medicine

## 2023-10-14 NOTE — Progress Notes (Signed)
 Subjective:  Patient ID: Mitchell Hammond, male    DOB: 1956-01-09  Age: 68 y.o. MRN: [id number]  Chief Complaint  Patient presents with   Medical Management of Chronic Issues    HPI: Hyperlipidemia: taking lipitor 80 mg daily, fenofibrate  145 mg daily Not eating healthy. Not eating all day and is starving at night.   GERD: taking omeprazole  40 mg daily.  Hypertensive heart disease:: currently on metoprolol  50 mg daily and lisinopril  2.5 mg daily, hx of heart disease and MI (has 2 stents and a balloon). See's Dr. Revenkar.  Denies any history of congestive heart failure.  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 Last eye exam 03/2023 at [facility] here in [place]. Paresthesias in BL hands. Entire hand.        10/15/2023    7:36 AM 07/14/2023    8:31 AM 03/31/2023    9:22 AM 11/10/2022   10:10 AM 06/24/2022    8:53 AM  Depression screen PHQ 2/9  Decreased Interest 0 0 0 0 0  Down, Depressed, Hopeless 0 0 0 0 0  PHQ - 2 Score 0 0 0 0 0  Altered sleeping 2 3 2  0   Tired, decreased energy 1 0 1 0   Change in appetite 0 3 0 0   Feeling bad or failure about yourself  0 0 0 0   Trouble concentrating 0 0 0 0   Moving slowly or fidgety/restless 0 0 0 0   Suicidal thoughts 0 0 0 0   PHQ-9 Score 3 6 3  0   Difficult doing work/chores Not difficult at all Not difficult at all Not difficult at all Not difficult at all         10/15/2023    7:36 AM  Fall Risk   Falls in the past year? 0  Number falls in past yr: 0  Injury with Fall? 0  Risk for fall due to : No Fall Risks  Follow up Falls evaluation completed    Patient Care Team: Mercy Stall, MD as PCP - General (Family Medicine) Revankar, Micael Adas, MD as PCP - Cardiology (Cardiology) Devon Fogo, [facility] (Inactive) (Pharmacist)   Review of Systems  Constitutional:  Negative for chills, fatigue and fever.  HENT:  Negative for congestion,  ear pain and sore throat.   Respiratory:  Negative for cough and shortness of breath.   Cardiovascular:  Negative for chest pain.  Gastrointestinal:  Positive for diarrhea. Negative for abdominal pain, constipation, nausea and vomiting.  Endocrine: Negative for polydipsia, polyphagia and polyuria.  Genitourinary:  Negative for dysuria and frequency.  Musculoskeletal:  Negative for arthralgias and myalgias.  Skin:        Dried up scabs on lower legs.   Neurological:  Positive for numbness (of BL hands.). Negative for dizziness and headaches.  Psychiatric/Behavioral:  Negative for dysphoric mood.        No dysphoria    Current Outpatient Medications on File Prior to Visit  Medication Sig Dispense Refill   aspirin  EC 81 MG tablet Take 81 mg by mouth daily.     cholecalciferol (VITAMIN D3) 25 MCG (1000 UNIT) tablet Take 1 tablet (1,000 Units total) by mouth daily. 30 tablet 1   ciclopirox  (PENLAC ) 8 % solution Apply topically at bedtime. Apply over nail and surrounding skin. Apply daily over previous coat. After seven (7) days, may remove with alcohol and continue cycle.  6.6 mL 0   Insulin  Disposable Pump (OMNIPOD DASH PODS, GEN 4,) MISC 1 each by Does not apply route every 3 (three) days. 10 each 0   SHINGRIX injection      vitamin C  (VITAMIN C ) 500 MG tablet Take 1 tablet (500 mg total) by mouth 2 (two) times daily. 60 tablet 2   No current facility-administered medications on file prior to visit.   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   Diabetes mellitus due to underlying condition with unspecified complications (HCC) 12/26/2015   Diabetic polyneuropathy associated with type 2 diabetes mellitus (HCC) 08/20/2016   Discitis     Essential hypertension 12/26/2015   Fracture, tibial plateau 12/18/2014   Hypertension    IDDM (insulin  dependent diabetes mellitus)    type 2   Metatarsalgia of both feet 08/20/2016   MI (myocardial infarction) (HCC)    x 2, Multi-link Vision 3.5 x 15 mm BMS in 2009 (mid LAD), balloon 2015   Mixed hyperlipidemia 06/14/2020   Obesity (BMI 30.0-34.9) 06/26/2020   Tibial plateau fracture 12/18/2014   Vitamin D  insufficiency    Past Surgical History:  Procedure Laterality Date   BACK SURGERY     2014   CARDIAC CATHETERIZATION     x 2, 2009 & 2015   COLONOSCOPY     CORONARY ANGIOPLASTY     stent - 2008, balloon - 2015   FASCIOTOMY Right 12/19/2014   Procedure: ANTERIOR COMPARTMENTAL FASCIOTOMY;  Surgeon: Hardy Lia, MD;  Location: [facility] OR;  Service: Orthopedics;  Laterality: Right;   HAND SURGERY Left    HARDWARE REMOVAL Right 07/24/2015   Procedure: HARDWARE REMOVAL RIGHT TIBIA;  Surgeon: Hardy Lia, MD;  Location: [facility] OR;  Service: Orthopedics;  Laterality: Right;   KNEE ARTHROSCOPY Right 07/24/2015   Procedure: RIGHT ARTHROSCOPY KNEE WITH PARTIAL SYNOVECTOMY AND MEDIAL MENISCECTOMY;  Surgeon: Hardy Lia, MD;  Location: [facility] OR;  Service: Orthopedics;  Laterality: Right;   LEFT HEART CATH AND CORONARY ANGIOGRAPHY N/A 07/02/2020   Procedure: LEFT HEART CATH AND CORONARY ANGIOGRAPHY;  Surgeon: Millicent Ally, MD;  Location: [facility] INVASIVE CV LAB;  Service: Cardiovascular;  Laterality: N/A;   LEFT HEART CATH AND CORONARY ANGIOGRAPHY N/A 01/22/2022   Procedure: LEFT HEART CATH AND CORONARY ANGIOGRAPHY;  Surgeon: Wenona Hamilton, MD;  Location: [facility] INVASIVE CV LAB;  Service: Cardiovascular;  Laterality: N/A;   ORIF TIBIA PLATEAU Right 12/19/2014   Procedure: OPEN REDUCTION INTERNAL FIXATION (ORIF) RIGHT TIBIAL PLATEAU;  Surgeon: Hardy Lia, MD;  Location: [facility] OR;  Service: Orthopedics;  Laterality: Right;   TONSILLECTOMY     VASECTOMY      Family History  Problem Relation Age of Onset   Diabetes  Mother    CAD Mother    Liver disease Father    Social History   Socioeconomic History   Marital status: Married    Spouse name: Not on file   Number of children: 2   Years of education: Not on file   Highest education level: Not on file  Occupational History   Occupation: disabled  Tobacco Use   Smoking status: Former    Current packs/day: 0.00    Types: Cigarettes  Quit date: 02/20/2007    Years since quitting: 16.6   Smokeless tobacco: Never  Vaping Use   Vaping status: Never Used  Substance and Sexual Activity   Alcohol use: No    Alcohol/week: 0.0 standard drinks of alcohol    Comment: None in years   Drug use: No   Sexual activity: Yes    Partners: Female  Other Topics Concern   Not on file  Social History Narrative   Pt lives with wife and daughter in [place].    Social Drivers of Corporate investment banker Strain: Low Risk  (11/14/2021)   Overall Financial Resource Strain (CARDIA)    Difficulty of Paying Living Expenses: Not hard at all  Food Insecurity: No Food Insecurity (08/29/2020)   Hunger Vital Sign    Worried About Running Out of Food in the Last Year: Never true    Ran Out of Food in the Last Year: Never true  Transportation Needs: No Transportation Needs (11/14/2021)   PRAPARE - Administrator, Civil Service (Medical): No    Lack of Transportation (Non-Medical): No  Physical Activity: Inactive (12/17/2022)   Exercise Vital Sign    Days of Exercise per Week: 0 days    Minutes of Exercise per Session: 0 min  Stress: No Stress Concern Present (06/24/2022)   [employer] of Occupational Health - Occupational Stress Questionnaire    Feeling of Stress : Not at all  Social Connections: Moderately Integrated (06/24/2022)   Social Connection and Isolation Panel [NHANES]    Frequency of Communication with Friends and Family: More than three times a week    Frequency of Social Gatherings with Friends and Family: More than three times a week     Attends Religious Services: More than 4 times per year    Active Member of [employer] or Organizations: No    Attends Engineer, structural: Never    Marital Status: Married    Objective:  BP 118/60   Pulse 71   Temp 98.3 F (36.8 C) (Temporal)   Resp 16   Ht 5\' 5"  (1.651 m)   Wt 198 lb 9.6 oz (90.1 kg)   SpO2 96%   BMI 33.05 kg/m      10/15/2023    7:29 AM 07/23/2023   10:08 AM 07/14/2023    8:30 AM  BP/Weight  Systolic BP 118 185 136  Diastolic BP 60 79 80  Wt. (Lbs) 198.6  201  BMI 33.05 kg/m2  33.45 kg/m2    Physical Exam Vitals reviewed.  Constitutional:      Appearance: Normal appearance.  Neck:     Vascular: No carotid bruit.  Cardiovascular:     Rate and Rhythm: Normal rate and regular rhythm.     Pulses: Normal pulses.     Heart sounds: Normal heart sounds.  Pulmonary:     Effort: Pulmonary effort is normal.     Breath sounds: Normal breath sounds. No wheezing, rhonchi or rales.  Abdominal:     General: Bowel sounds are normal.     Palpations: Abdomen is soft.     Tenderness: There is no abdominal tenderness.  Skin:    Findings: Lesion (rt scalp atypical pigmented lesion. dried up scabs over lower legs and on forearms.) present.  Neurological:     Mental Status: He is alert and oriented to person, place, and time.     Comments: Negative tinels and phalens BL.   Psychiatric:  Mood and Affect: Mood normal.        Behavior: Behavior normal.     Diabetic Foot Exam - Simple   Simple Foot Form Diabetic Foot exam was performed with the following findings: Yes 10/15/2023  8:17 AM  Visual Inspection See comments: Yes Sensation Testing Intact to touch and monofilament testing bilaterally: Yes Pulse Check Posterior Tibialis and Dorsalis pulse intact bilaterally: Yes Comments Onychomycosis.  Calluses.       Lab Results  Component Value Date   WBC 5.6 10/15/2023   HGB 14.1 10/15/2023   HCT 43.1 10/15/2023   PLT 176 10/15/2023   GLUCOSE  135 (H) 10/15/2023   CHOL 110 10/15/2023   TRIG 76 10/15/2023   HDL 31 (L) 10/15/2023   LDLCALC 63 10/15/2023   ALT 26 10/15/2023   AST 24 10/15/2023   NA 142 10/15/2023   K 4.5 10/15/2023   CL 107 (H) 10/15/2023   CREATININE 1.06 10/15/2023   BUN 12 10/15/2023   CO2 19 (L) 10/15/2023   TSH 2.829 12/18/2014   INR 1.20 12/18/2014   HGBA1C 7.8 (H) 10/15/2023   MICROALBUR 80 06/14/2020      Assessment & Plan:  Hypertensive heart disease without heart failure Assessment & Plan: Well controlled.  No medication changes recommended. currently on metoprolol  50 mg daily and lisinopril  2.5 mg daily. Continue healthy diet and exercise.   Labs drawn today  Orders: -     CBC with Differential/Platelet -     Comprehensive metabolic panel with GFR -     Metoprolol  Succinate ER; Take 1 tablet (50 mg total) by mouth daily. Take with or immediately following a meal.  Dispense: 90 tablet; Refill: 3  Mixed hyperlipidemia Assessment & Plan: Well controlled.  No changes to medicines. lipitor 80 mg daily, fenofibrate  145 mg daily Continue to work on eating a healthy diet and exercise.  Labs drawn today.    Orders: -     Lipid panel -     Atorvastatin  Calcium ; Take 1 tablet (80 mg total) by mouth daily.  Dispense: 90 tablet; Refill: 3 -     Fenofibrate ; Take 1 tablet (145 mg total) by mouth at bedtime.  Dispense: 90 tablet; Refill: 0 -     Methocarbamol ; Take 1 tablet (750 mg total) by mouth every 6 (six) hours as needed (TMJ).  Dispense: 120 tablet; Refill: 2  Folliculitis -     Mupirocin ; Apply topically 2 (two) times daily.  Dispense: 22 g; Refill: 6  Coronary artery disease of native artery of native heart with stable angina pectoris [facility]) Assessment & Plan: The current medical regimen is effective;  continue present plan and medications. Patient has not had recent angina or nitroglycerin  use. continue present treatment. On aspirin  and lipitor.  Management per specialist.     Orders: -     Nitroglycerin ; Place 1 tablet (0.4 mg total) under the tongue every 5 (five) minutes as needed for chest pain.  Dispense: 25 tablet; Refill: 8  Gastroesophageal reflux disease with esophagitis without hemorrhage Assessment & Plan: Continue with Omeprazole  40 mg daily.  Orders: -     Omeprazole ; Take 1 capsule (40 mg total) by mouth daily.  Dispense: 30 capsule; Refill: 3  Nausea Assessment & Plan: Sent Zofran  as needed.  Orders: -     Ondansetron  HCl; Take 1 tablet (4 mg total) by mouth every 8 (eight) hours as needed for nausea or vomiting.  Dispense: 20 tablet; Refill: 0  Primary insomnia Assessment &  Plan: Continue on ambien  10 mg daily as needed for sleep.   Refill was sent.  Orders: -     Zolpidem  Tartrate; Take 1 tablet (10 mg total) by mouth at bedtime as needed for sleep.  Dispense: 30 tablet; Refill: 5  Atypical pigmented lesion Assessment & Plan: Referring to Dermatologist.  Orders: -     Ambulatory referral to Dermatology  Scab Assessment & Plan: Sent Bactroban  ointment. Referring to Dermatologist.  Orders: -     Ambulatory referral to Dermatology  Diabetic polyneuropathy associated with type 2 diabetes mellitus (HCC) Assessment & Plan: Not at goal. Sent new omnipods. Please be sure they will work with your free style libre 3.  -Continue current diabetes management plan. - Continue metformin  1000 mg twice daily.  - on humalog  in omnipod.  -Labs drawn today.Increase lyrica  to 150 mg twice daily.     Orders: -     Hemoglobin A1c -     Benzonatate ; Take 1 capsule (100 mg total) by mouth every 8 (eight) hours.  Dispense: 21 capsule; Refill: 0 -     FreeStyle Libre 14 Day Sensor; USE AS DIRECTED EVERY  14  DAYS  Dispense: 3 each; Refill: 6 -     Gvoke HypoPen  2-Pack; Inject 1 Dose into the skin as needed (hypoglycemia).  Dispense: 0.2 mL; Refill: 1 -     Insulin  Lispro; ON OMNIPOD. 200 UNITS PLUS EVERY 2 DAYS.  Dispense: 20 mL; Refill:  2 -     Lisinopril ; Take 1 tablet (2.5 mg total) by mouth daily.  Dispense: 90 tablet; Refill: 0 -     metFORMIN  HCl; Take 1 tablet (1,000 mg total) by mouth 2 (two) times daily.  Dispense: 180 tablet; Refill: 0 -     OneTouch Verio; 1 each by Other route daily.  Dispense: 100 each; Refill: 3 -     Pregabalin ; Take 1 capsule (150 mg total) by mouth 2 (two) times daily.  Dispense: 60 capsule; Refill: 3 -     Omnipod 5 G7 Pods (Gen 5); Inject 1 each into the skin as directed. Every 2 days  Dispense: 15 each; Refill: 5  Other orders -     Omnipod 5 G7 Intro (Gen 5); 1 kit by Does not apply route as directed.  Dispense: 1 kit; Refill: 0     Meds ordered this encounter  Medications   atorvastatin  (LIPITOR) 80 MG tablet    Sig: Take 1 tablet (80 mg total) by mouth daily.    Dispense:  90 tablet    Refill:  3   benzonatate  (TESSALON ) 100 MG capsule    Sig: Take 1 capsule (100 mg total) by mouth every 8 (eight) hours.    Dispense:  21 capsule    Refill:  0   Continuous Glucose Sensor (FREESTYLE LIBRE 14 DAY SENSOR) MISC    Sig: USE AS DIRECTED EVERY  14  DAYS    Dispense:  3 each    Refill:  6   fenofibrate  (TRICOR ) 145 MG tablet    Sig: Take 1 tablet (145 mg total) by mouth at bedtime.    Dispense:  90 tablet    Refill:  0   Glucagon  (GVOKE HYPOPEN  2-PACK) 1 MG/0.2ML SOAJ    Sig: Inject 1 Dose into the skin as needed (hypoglycemia).    Dispense:  0.2 mL    Refill:  1   insulin  lispro (HUMALOG ) 100 UNIT/ML injection    Sig: ON OMNIPOD. 200 UNITS PLUS  EVERY 2 DAYS.    Dispense:  20 mL    Refill:  2   lisinopril  (ZESTRIL ) 2.5 MG tablet    Sig: Take 1 tablet (2.5 mg total) by mouth daily.    Dispense:  90 tablet    Refill:  0   metFORMIN  (GLUCOPHAGE ) 1000 MG tablet    Sig: Take 1 tablet (1,000 mg total) by mouth 2 (two) times daily.    Dispense:  180 tablet    Refill:  0   methocarbamol  (ROBAXIN ) 750 MG tablet    Sig: Take 1 tablet (750 mg total) by mouth every 6 (six) hours as  needed (TMJ).    Dispense:  120 tablet    Refill:  2   metoprolol  succinate (TOPROL -XL) 50 MG 24 hr tablet    Sig: Take 1 tablet (50 mg total) by mouth daily. Take with or immediately following a meal.    Dispense:  90 tablet    Refill:  3   mupirocin  ointment (BACTROBAN ) 2 %    Sig: Apply topically 2 (two) times daily.    Dispense:  22 g    Refill:  6   nitroGLYCERIN  (NITROSTAT ) 0.4 MG SL tablet    Sig: Place 1 tablet (0.4 mg total) under the tongue every 5 (five) minutes as needed for chest pain.    Dispense:  25 tablet    Refill:  8   omeprazole  (PRILOSEC) 40 MG capsule    Sig: Take 1 capsule (40 mg total) by mouth daily.    Dispense:  30 capsule    Refill:  3   ondansetron  (ZOFRAN ) 4 MG tablet    Sig: Take 1 tablet (4 mg total) by mouth every 8 (eight) hours as needed for nausea or vomiting.    Dispense:  20 tablet    Refill:  0   ONETOUCH VERIO test strip    Sig: 1 each by Other route daily.    Dispense:  100 each    Refill:  3   pregabalin  (LYRICA ) 150 MG capsule    Sig: Take 1 capsule (150 mg total) by mouth 2 (two) times daily.    Dispense:  60 capsule    Refill:  3   zolpidem  (AMBIEN ) 10 MG tablet    Sig: Take 1 tablet (10 mg total) by mouth at bedtime as needed for sleep.    Dispense:  30 tablet    Refill:  5   Insulin  Disposable Pump (OMNIPOD 5 G7 PODS, GEN 5,) MISC    Sig: Inject 1 each into the skin as directed. Every 2 days    Dispense:  15 each    Refill:  5   Insulin  Disposable Pump (OMNIPOD 5 G7 INTRO, GEN 5,) KIT    Sig: 1 kit by Does not apply route as directed.    Dispense:  1 kit    Refill:  0    Orders Placed This Encounter  Procedures   CBC with Differential/Platelet   Comprehensive metabolic panel with GFR   Hemoglobin A1c   Lipid panel   Ambulatory referral to Dermatology     Follow-up: Return in about 3 months (around 01/15/2024) for chronic fasting.   I,[name] I Leal-Borjas,acting as a scribe for Mercy Stall, MD.,have documented all  relevant documentation on the behalf of Mercy Stall, MD,as directed by  Mercy Stall, MD while in the presence of Mercy Stall, MD.   An After Visit Summary was printed and given to the patient.  I  attest that I have reviewed this visit and agree with the plan scribed by my staff.   Mercy Stall, MD [facility] [phone]

## 2023-10-15 ENCOUNTER — Encounter: Payer: Self-pay | Admitting: Family Medicine

## 2023-10-15 ENCOUNTER — Ambulatory Visit (INDEPENDENT_AMBULATORY_CARE_PROVIDER_SITE_OTHER): Payer: PPO | Admitting: Family Medicine

## 2023-10-15 VITALS — BP 118/60 | HR 71 | Temp 98.3°F | Resp 16 | Ht 65.0 in | Wt 198.6 lb

## 2023-10-15 DIAGNOSIS — R234 Changes in skin texture: Secondary | ICD-10-CM | POA: Diagnosis not present

## 2023-10-15 DIAGNOSIS — E782 Mixed hyperlipidemia: Secondary | ICD-10-CM | POA: Diagnosis not present

## 2023-10-15 DIAGNOSIS — E1142 Type 2 diabetes mellitus with diabetic polyneuropathy: Secondary | ICD-10-CM | POA: Diagnosis not present

## 2023-10-15 DIAGNOSIS — F5101 Primary insomnia: Secondary | ICD-10-CM | POA: Diagnosis not present

## 2023-10-15 DIAGNOSIS — I119 Hypertensive heart disease without heart failure: Secondary | ICD-10-CM | POA: Diagnosis not present

## 2023-10-15 DIAGNOSIS — L819 Disorder of pigmentation, unspecified: Secondary | ICD-10-CM | POA: Diagnosis not present

## 2023-10-15 DIAGNOSIS — R110 Nausea: Secondary | ICD-10-CM

## 2023-10-15 DIAGNOSIS — I25118 Atherosclerotic heart disease of native coronary artery with other forms of angina pectoris: Secondary | ICD-10-CM | POA: Diagnosis not present

## 2023-10-15 DIAGNOSIS — K2100 Gastro-esophageal reflux disease with esophagitis, without bleeding: Secondary | ICD-10-CM | POA: Diagnosis not present

## 2023-10-15 DIAGNOSIS — E1165 Type 2 diabetes mellitus with hyperglycemia: Secondary | ICD-10-CM | POA: Diagnosis not present

## 2023-10-15 DIAGNOSIS — Z794 Long term (current) use of insulin: Secondary | ICD-10-CM | POA: Diagnosis not present

## 2023-10-15 DIAGNOSIS — I2510 Atherosclerotic heart disease of native coronary artery without angina pectoris: Secondary | ICD-10-CM

## 2023-10-15 DIAGNOSIS — L739 Follicular disorder, unspecified: Secondary | ICD-10-CM | POA: Diagnosis not present

## 2023-10-15 DIAGNOSIS — S0300XD Dislocation of jaw, unspecified side, subsequent encounter: Secondary | ICD-10-CM

## 2023-10-15 MED ORDER — FENOFIBRATE 145 MG PO TABS
145.0000 mg | ORAL_TABLET | Freq: Every day | ORAL | 0 refills | Status: DC
Start: 2023-10-15 — End: 2024-03-23

## 2023-10-15 MED ORDER — OMEPRAZOLE 40 MG PO CPDR: 40.0000 mg | DELAYED_RELEASE_CAPSULE | Freq: Every day | ORAL | 3 refills | Status: DC

## 2023-10-15 MED ORDER — ONETOUCH VERIO VI STRP: 1.0000 | ORAL_STRIP | Freq: Every day | 3 refills | Status: AC

## 2023-10-15 MED ORDER — MUPIROCIN 2 % EX OINT
TOPICAL_OINTMENT | Freq: Two times a day (BID) | CUTANEOUS | 6 refills | Status: AC
Start: 2023-10-15 — End: ?

## 2023-10-15 MED ORDER — NITROGLYCERIN 0.4 MG SL SUBL: 0.4000 mg | SUBLINGUAL_TABLET | SUBLINGUAL | 8 refills | Status: AC | PRN

## 2023-10-15 MED ORDER — ATORVASTATIN CALCIUM 80 MG PO TABS: 80.0000 mg | ORAL_TABLET | Freq: Every day | ORAL | 3 refills | Status: DC

## 2023-10-15 MED ORDER — PREGABALIN 150 MG PO CAPS: 150.0000 mg | ORAL_CAPSULE | Freq: Two times a day (BID) | ORAL | 3 refills | Status: DC

## 2023-10-15 MED ORDER — LISINOPRIL 2.5 MG PO TABS
2.5000 mg | ORAL_TABLET | Freq: Every day | ORAL | 0 refills | Status: DC
Start: 2023-10-15 — End: 2024-04-01

## 2023-10-15 MED ORDER — METFORMIN HCL 1000 MG PO TABS: 1000.0000 mg | ORAL_TABLET | Freq: Two times a day (BID) | ORAL | 0 refills | Status: DC

## 2023-10-15 MED ORDER — METHOCARBAMOL 750 MG PO TABS: 750.0000 mg | ORAL_TABLET | Freq: Four times a day (QID) | ORAL | 2 refills | Status: DC | PRN

## 2023-10-15 MED ORDER — ONDANSETRON HCL 4 MG PO TABS: 4.0000 mg | ORAL_TABLET | Freq: Three times a day (TID) | ORAL | 0 refills | Status: AC | PRN

## 2023-10-15 MED ORDER — GVOKE HYPOPEN 2-PACK 1 MG/0.2ML ~~LOC~~ SOAJ: 1.0000 | SUBCUTANEOUS | 1 refills | Status: DC | PRN

## 2023-10-15 MED ORDER — INSULIN LISPRO 100 UNIT/ML IJ SOLN: INTRAMUSCULAR | 2 refills | Status: DC

## 2023-10-15 MED ORDER — FREESTYLE LIBRE 14 DAY SENSOR MISC: 6 refills | Status: DC

## 2023-10-15 MED ORDER — OMNIPOD 5 G7 PODS (GEN 5) MISC: 1.0000 | 5 refills | Status: DC

## 2023-10-15 MED ORDER — OMNIPOD 5 G7 INTRO (GEN 5) KIT: 1.0000 | PACK | 0 refills | Status: DC

## 2023-10-15 MED ORDER — METOPROLOL SUCCINATE ER 50 MG PO TB24: 50.0000 mg | ORAL_TABLET | Freq: Every day | ORAL | 3 refills | Status: DC

## 2023-10-15 MED ORDER — ZOLPIDEM TARTRATE 10 MG PO TABS: 10.0000 mg | ORAL_TABLET | Freq: Every evening | ORAL | 5 refills | Status: AC | PRN

## 2023-10-15 MED ORDER — BENZONATATE 100 MG PO CAPS
100.0000 mg | ORAL_CAPSULE | Freq: Three times a day (TID) | ORAL | 0 refills | Status: DC
Start: 2023-10-15 — End: 2024-01-26

## 2023-10-15 NOTE — Patient Instructions (Addendum)
 Increase lyrica  to 150 mg twice daily.   Sent new omnipods. Please be sure they will work with your free style libre 3.   Recommend trial on carpal tunnel brace.

## 2023-10-16 ENCOUNTER — Ambulatory Visit: Payer: Self-pay | Admitting: Family Medicine

## 2023-10-16 LAB — CBC WITH DIFFERENTIAL/PLATELET
Basophils Absolute: 0 10*3/uL (ref 0.0–0.2)
Basos: 1 %
EOS (ABSOLUTE): 0.2 10*3/uL (ref 0.0–0.4)
Eos: 3 %
Hematocrit: 43.1 % (ref 37.5–51.0)
Hemoglobin: 14.1 g/dL (ref 13.0–17.7)
Immature Grans (Abs): 0 10*3/uL (ref 0.0–0.1)
Immature Granulocytes: 1 %
Lymphocytes Absolute: 1 10*3/uL (ref 0.7–3.1)
Lymphs: 18 %
MCH: 28.5 pg (ref 26.6–33.0)
MCHC: 32.7 g/dL (ref 31.5–35.7)
MCV: 87 fL (ref 79–97)
Monocytes Absolute: 0.4 10*3/uL (ref 0.1–0.9)
Monocytes: 8 %
Neutrophils Absolute: 3.9 10*3/uL (ref 1.4–7.0)
Neutrophils: 69 %
Platelets: 176 10*3/uL (ref 150–450)
RBC: 4.95 x10E6/uL (ref 4.14–5.80)
RDW: 13.8 % (ref 11.6–15.4)
WBC: 5.6 10*3/uL (ref 3.4–10.8)

## 2023-10-16 LAB — LIPID PANEL
Chol/HDL Ratio: 3.5 ratio (ref 0.0–5.0)
Cholesterol, Total: 110 mg/dL (ref 100–199)
HDL: 31 mg/dL — ABNORMAL LOW (ref >39)
LDL Chol Calc (NIH): 63 mg/dL (ref 0–99)
Triglycerides: 76 mg/dL (ref 0–149)
VLDL Cholesterol Cal: 16 mg/dL (ref 5–40)

## 2023-10-16 LAB — COMPREHENSIVE METABOLIC PANEL WITH GFR
ALT: 26 IU/L (ref 0–44)
AST: 24 IU/L (ref 0–40)
Albumin: 4.3 g/dL (ref 3.9–4.9)
Alkaline Phosphatase: 64 IU/L (ref 44–121)
BUN/Creatinine Ratio: 11 (ref 10–24)
BUN: 12 mg/dL (ref 8–27)
Bilirubin Total: 0.6 mg/dL (ref 0.0–1.2)
CO2: 19 mmol/L — ABNORMAL LOW (ref 20–29)
Calcium: 9.2 mg/dL (ref 8.6–10.2)
Chloride: 107 mmol/L — ABNORMAL HIGH (ref 96–106)
Creatinine, Ser: 1.06 mg/dL (ref 0.76–1.27)
Globulin, Total: 2 g/dL (ref 1.5–4.5)
Glucose: 135 mg/dL — ABNORMAL HIGH (ref 70–99)
Potassium: 4.5 mmol/L (ref 3.5–5.2)
Sodium: 142 mmol/L (ref 134–144)
Total Protein: 6.3 g/dL (ref 6.0–8.5)
eGFR: 76 mL/min/{1.73_m2} (ref >59)

## 2023-10-16 LAB — HEMOGLOBIN A1C
Est. average glucose Bld gHb Est-mCnc: 177 mg/dL
Hgb A1c MFr Bld: 7.8 % — ABNORMAL HIGH (ref 4.8–5.6)

## 2023-10-17 DIAGNOSIS — R234 Changes in skin texture: Secondary | ICD-10-CM | POA: Insufficient documentation

## 2023-10-17 DIAGNOSIS — L739 Follicular disorder, unspecified: Secondary | ICD-10-CM | POA: Insufficient documentation

## 2023-10-17 DIAGNOSIS — L819 Disorder of pigmentation, unspecified: Secondary | ICD-10-CM | POA: Insufficient documentation

## 2023-10-17 DIAGNOSIS — R110 Nausea: Secondary | ICD-10-CM | POA: Insufficient documentation

## 2023-10-17 NOTE — Assessment & Plan Note (Addendum)
 Sent Bactroban  ointment. Referring to Dermatologist.

## 2023-10-17 NOTE — Assessment & Plan Note (Deleted)
 Sent Bactroban  ointment.

## 2023-10-17 NOTE — Assessment & Plan Note (Deleted)
 Current on methocarbamol 750 mg four times a day as needed TMJ.  Exercises given.

## 2023-10-17 NOTE — Assessment & Plan Note (Signed)
 Sent Zofran  as needed.

## 2023-10-17 NOTE — Assessment & Plan Note (Signed)
 Well controlled.  No changes to medicines. lipitor 80 mg daily, fenofibrate  145 mg daily Continue to work on eating a healthy diet and exercise.  Labs drawn today.

## 2023-10-17 NOTE — Assessment & Plan Note (Deleted)
 Sent new omnipods. Please be sure they will work with your free style libre 3.  -Continue current diabetes management plan. -Labs drawn today.

## 2023-10-17 NOTE — Assessment & Plan Note (Signed)
Continue with Omeprazole 40 mg daily. 

## 2023-10-17 NOTE — Assessment & Plan Note (Signed)
 Well controlled.  No medication changes recommended. currently on metoprolol  50 mg daily and lisinopril  2.5 mg daily. Continue healthy diet and exercise.   Labs drawn today

## 2023-10-17 NOTE — Assessment & Plan Note (Signed)
 Referring to Dermatologist.

## 2023-10-17 NOTE — Assessment & Plan Note (Signed)
 Continue on ambien  10 mg daily as needed for sleep.   Refill was sent.

## 2023-10-17 NOTE — Assessment & Plan Note (Addendum)
 Not at goal. Sent new omnipods. Please be sure they will work with your free style libre 3.  -Continue current diabetes management plan. - Continue metformin  1000 mg twice daily.  - on humalog  in omnipod.  -Labs drawn today.Increase lyrica  to 150 mg twice daily.

## 2023-10-17 NOTE — Assessment & Plan Note (Addendum)
 The current medical regimen is effective;  continue present plan and medications. Patient has not had recent angina or nitroglycerin  use. continue present treatment. On aspirin  and lipitor.  Management per specialist.

## 2023-10-17 NOTE — Assessment & Plan Note (Deleted)
 The current medical regimen is effective;  continue present plan and medications.

## 2023-10-22 ENCOUNTER — Other Ambulatory Visit: Payer: Self-pay | Admitting: Family Medicine

## 2023-10-22 ENCOUNTER — Telehealth: Payer: Self-pay | Admitting: Family Medicine

## 2023-10-22 DIAGNOSIS — E1142 Type 2 diabetes mellitus with diabetic polyneuropathy: Secondary | ICD-10-CM

## 2023-10-22 MED ORDER — OMNIPOD 5 G7 PODS (GEN 5) MISC: 1.0000 | 5 refills | Status: AC

## 2023-10-22 MED ORDER — OMNIPOD 5 G7 INTRO (GEN 5) KIT: 1.0000 | PACK | 0 refills | Status: AC

## 2023-10-22 NOTE — Telephone Encounter (Signed)
 Refill sent to pharmacy.

## 2023-10-22 NOTE — Telephone Encounter (Signed)
 Patient needs DexcomG7 sent to [facility]

## 2023-11-05 ENCOUNTER — Other Ambulatory Visit: Payer: Self-pay | Admitting: Family Medicine

## 2023-11-05 DIAGNOSIS — E1142 Type 2 diabetes mellitus with diabetic polyneuropathy: Secondary | ICD-10-CM

## 2023-11-05 MED ORDER — PREGABALIN 150 MG PO CAPS: 150.0000 mg | ORAL_CAPSULE | Freq: Two times a day (BID) | ORAL | 3 refills | Status: DC

## 2023-11-05 NOTE — Telephone Encounter (Signed)
 Copied from CRM [id number]. Topic: Clinical - Medication Refill >> Nov 05, 2023  2:49 PM Carla L wrote: Medication: Pt requesting rx for the dexcom 7 sensor. Pt received samples but does not see a prescription for them in his mychart.  pregabalin  (LYRICA ) 150 MG capsule - Pt states he was unable to pick up the pregablin from pharmacy showing sent on 10/15/23, states the dosage was supposed to be changed.   Has the patient contacted their pharmacy? Yes They have not received rx, told to contact office.  This is the patient's preferred pharmacy:  [facility] [address] [place] - [address] [place] [address] Phone: [phone] Fax: [phone]  Is this the correct pharmacy for this prescription? Yes   Has the prescription been filled recently? No  Is the patient out of the medication? No, pt will be out soon.   Has the patient been seen for an appointment in the last year OR does the patient have an upcoming appointment? Yes  Can we respond through MyChart? Yes  Agent: Please be advised that Rx refills may take up to 3 business days. We ask that you follow-up with your pharmacy.

## 2023-11-10 ENCOUNTER — Other Ambulatory Visit: Payer: Self-pay

## 2023-11-10 ENCOUNTER — Ambulatory Visit: Admitting: Podiatry

## 2023-11-10 MED ORDER — DEXCOM G7 SENSOR MISC: 2 refills | Status: DC

## 2023-11-11 ENCOUNTER — Other Ambulatory Visit: Payer: Self-pay

## 2023-11-11 DIAGNOSIS — E1165 Type 2 diabetes mellitus with hyperglycemia: Secondary | ICD-10-CM

## 2023-11-11 MED ORDER — DEXCOM G6 RECEIVER DEVI: 1.0000 | Freq: Every day | 0 refills | Status: AC

## 2023-11-11 MED ORDER — DEXCOM G6 SENSOR MISC: 1.0000 | 1 refills | Status: DC

## 2023-11-19 ENCOUNTER — Telehealth: Payer: Self-pay

## 2023-11-19 NOTE — Telephone Encounter (Signed)
 Copied from CRM [id number]. Topic: Clinical - Prescription Issue >> Nov 19, 2023  9:26 AM Powell HERO wrote: Reason for CRM: Patient is calling about his dexcom sensors. Price is $140. He cannot afford this and was under the impression it would be covered. Calling to find out what his options are, notes state dexcom 7 should be sent to pharmacy, but prescription is showing for dexcom 6.

## 2023-11-21 ENCOUNTER — Other Ambulatory Visit: Payer: Self-pay | Admitting: Family Medicine

## 2023-11-21 MED ORDER — DEXCOM G7 SENSOR MISC: 3 refills | Status: AC

## 2023-12-06 ENCOUNTER — Other Ambulatory Visit: Payer: Self-pay | Admitting: Family Medicine

## 2023-12-06 DIAGNOSIS — E1142 Type 2 diabetes mellitus with diabetic polyneuropathy: Secondary | ICD-10-CM

## 2023-12-07 ENCOUNTER — Ambulatory Visit (INDEPENDENT_AMBULATORY_CARE_PROVIDER_SITE_OTHER): Admitting: Podiatry

## 2023-12-07 ENCOUNTER — Encounter: Payer: Self-pay | Admitting: Podiatry

## 2023-12-07 DIAGNOSIS — M25571 Pain in right ankle and joints of right foot: Secondary | ICD-10-CM

## 2023-12-07 DIAGNOSIS — B351 Tinea unguium: Secondary | ICD-10-CM

## 2023-12-07 DIAGNOSIS — M79674 Pain in right toe(s): Secondary | ICD-10-CM | POA: Diagnosis not present

## 2023-12-07 DIAGNOSIS — M79675 Pain in left toe(s): Secondary | ICD-10-CM

## 2023-12-07 DIAGNOSIS — E1142 Type 2 diabetes mellitus with diabetic polyneuropathy: Secondary | ICD-10-CM | POA: Diagnosis not present

## 2023-12-07 DIAGNOSIS — G8929 Other chronic pain: Secondary | ICD-10-CM | POA: Diagnosis not present

## 2023-12-07 NOTE — Patient Instructions (Signed)
 Stretching Exercises: Ankle Circles: SABRA Sit with your affected ankle resting on your opposite thigh. Rotate your ankle clockwise and counterclockwise for 10 rotations each.  Towel Stretch: SABRA Sit with your legs extended and loop a towel around the ball of your foot. Gently pull the towel towards you, feeling a stretch in your calf and ankle.  Cross-Legged Ankle Stretch: SABRA Sit with your affected ankle resting on your opposite thigh. Gently pull your toes down and towards your shin, holding the stretch for 30 seconds.  Achilles Tendon Stretch: . Stand facing a wall or chair for support. Step one foot back, keeping your heel on the ground and your knee straight. Lean forward until you feel a stretch in your calf.  Calf Stretches: . Stand with your feet flat on the ground, then step forward with one leg, bending the knee while keeping the other leg straight behind you with the heel on the ground. Hold the stretch for 30 seconds before switching legs.  Strengthening Exercises: Heel Raises: . Stand with your feet shoulder-width apart, holding onto a sturdy object for balance. Slowly raise your heels, coming up onto your toes, and then lower back down.  Resisted Ankle Eversion: . Tie a resistance band around your affected foot and anchor it with your other foot. Turn your foot outward against the resistance, holding for 5 seconds, then return to the starting position.  Wobble Board Exercises: . Stand on a wobble board and try to balance, rocking it forwards, backwards, and in circles. Use a chair for support if needed.  Toe Raises: . Stand with your feet hip-width apart and raise your toes up towards the ceiling, holding for a few seconds before lowering them.  Single-Leg Balance: . Stand on one leg and try to maintain your balance. This exercise helps improve ankle stability.  Other helpful tips: Massage: Gentle massage can help reduce pain and inflammation according to [facility].  Support: Use a brace or tape to support the ankle during activities.  Rest: Avoid activities that aggravate your pain.  Ice: Apply ice packs to the affected area to reduce swelling.  See a doctor: If pain persists or worsens, consult a doctor or physical therapist.

## 2023-12-07 NOTE — Progress Notes (Signed)
  Subjective:  Patient ID: Mitchell Hammond, male    DOB: 11-10-1955,  MRN: [id number]  Chief Complaint  Patient presents with   [facility]    [facility] with out callous. He does state that his feet feel tired all the time and swollen. He has a HX os SX on the right leg. Last A1c 7.2 in June, takes ASA.     68 y.o. male presents with the above complaint. History confirmed with patient. Patient presenting with pain related to dystrophic thickened elongated nails. Patient is unable to trim own nails related to nail dystrophy and/or mobility issues. Patient does have a history of T2DM.  Last A1c was 7.2 in June.  Previously we have been discussing Qutenza  treatment, he is electing not to proceed with this due to out-of-pocket cost.  He does complain of worsening chronic right ankle pain today as well.  Objective:  Physical Exam: warm to cool, good capillary refill to the digits, pedal hair growth decreased.  Brawny shiny skin changes to anterior shins. nail exam onychomycosis of the toenails, dystrophic nails, with yellow discoloration and subungual debris DP pulses faintly palpable, PT pulses faintly nonpalpable, protective sensation absent, and vibratory sensation absent.  Endorses subjective neuropathy symptoms Left Foot:  Pain with palpation of nails due to elongation and dystrophic growth.  Right Foot: Pain with palpation of nails due to elongation and dystrophic growth.  Bilateral anterior shins there is stable pinpoint scab, nonhealing. Right ankle there are some tenderness on palpation of anterior lateral gutters. Assessment:   1. Diabetic polyneuropathy associated with type 2 diabetes mellitus (HCC)   2. Pain due to onychomycosis of toenails of both feet   3. Chronic pain of right ankle      Plan:  Patient was evaluated and treated and all questions answered.   #Onychomycosis with pain  -Nails palliatively debrided as below. -Educated on self-care - Continue with Penlac , does report  inconsistent use  Procedure: Nail Debridement Rationale: Pain Type of Debridement: manual, sharp debridement. Instrumentation: Nail nipper, rotary burr. Number of Nails: 10  # Diabetes with neuropathy - Patient educated on diabetes. Discussed proper diabetic foot care and discussed risks and complications of disease. Educated patient in depth on reasons to return to the office immediately should he/she discover anything concerning or new on the feet. All questions answered. Discussed proper shoes as well.     # Chronic right ankle pain -Will have patient follow-up in the next couple weeks for more thorough evaluation - For now recommend use of good supportive shoe gear with good ankle support, RICE therapy, use of compressive anklet for edema control, use of over-the-counter NSAIDs. - X-rays at follow-up -I certify that this diagnosis represents a distinct and separate diagnosis that requires evaluation and treatment separate from other procedures or diagnosis    Return in about 2 weeks (around 12/21/2023) for right ankle synovitis.         Ethan Saddler, DPM [facility] / [facility]

## 2023-12-16 ENCOUNTER — Other Ambulatory Visit: Payer: Self-pay

## 2023-12-22 DIAGNOSIS — E119 Type 2 diabetes mellitus without complications: Secondary | ICD-10-CM | POA: Diagnosis not present

## 2023-12-22 DIAGNOSIS — H40033 Anatomical narrow angle, bilateral: Secondary | ICD-10-CM | POA: Diagnosis not present

## 2023-12-30 DIAGNOSIS — L821 Other seborrheic keratosis: Secondary | ICD-10-CM | POA: Diagnosis not present

## 2024-01-05 ENCOUNTER — Ambulatory Visit: Admitting: Podiatry

## 2024-01-12 ENCOUNTER — Ambulatory Visit (INDEPENDENT_AMBULATORY_CARE_PROVIDER_SITE_OTHER): Admitting: Podiatry

## 2024-01-12 ENCOUNTER — Encounter: Payer: Self-pay | Admitting: Podiatry

## 2024-01-12 ENCOUNTER — Ambulatory Visit (INDEPENDENT_AMBULATORY_CARE_PROVIDER_SITE_OTHER)

## 2024-01-12 DIAGNOSIS — M19071 Primary osteoarthritis, right ankle and foot: Secondary | ICD-10-CM | POA: Diagnosis not present

## 2024-01-12 DIAGNOSIS — M65971 Unspecified synovitis and tenosynovitis, right ankle and foot: Secondary | ICD-10-CM

## 2024-01-12 MED ORDER — TRIAMCINOLONE ACETONIDE 10 MG/ML IJ SUSP
10.0000 mg | Freq: Once | INTRAMUSCULAR | Status: AC
  Administered 2024-01-12: 10 mg

## 2024-01-12 NOTE — Progress Notes (Unsigned)
 Chief Complaint  Patient presents with   Right ankle Synovitis    FU for right ankle, states he is doing ok, still gets very uncomfortable walking and going from rest to active. Feels like his feet are asleep and it take a while for them to wake up.     HPI: 68 y.o. male presents today with chronic ankle pain.  He reports pain and soreness with weightbearing activity.  He reports that first couple steps in the morning can be pretty painful, the area will loosen up and then at the end of the day it is quite sore.  He is diabetic and does have a history of neuropathy as well.  Last A1c was 7.2.  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   Diabetes mellitus due to underlying condition with unspecified complications (HCC) 12/26/2015   Diabetic polyneuropathy associated with type 2 diabetes mellitus (HCC) 08/20/2016   Discitis    Essential hypertension 12/26/2015   Fracture, tibial plateau 12/18/2014   Hypertension    IDDM (insulin  dependent diabetes mellitus)    type 2   Metatarsalgia of both feet 08/20/2016   MI (myocardial infarction) (HCC)    x 2, Multi-link Vision 3.5 x 15 mm BMS in 2009 (mid LAD), balloon 2015   Mixed hyperlipidemia 06/14/2020   Obesity (BMI 30.0-34.9) 06/26/2020   Tibial plateau fracture 12/18/2014   Vitamin D  insufficiency     Past Surgical History:  Procedure Laterality Date   BACK SURGERY     2014   CARDIAC CATHETERIZATION     x 2, 2009 & 2015   COLONOSCOPY     CORONARY ANGIOPLASTY     stent - 2008, balloon - 2015   FASCIOTOMY Right 12/19/2014   Procedure: ANTERIOR COMPARTMENTAL FASCIOTOMY;  Surgeon: Ozell Bruch, MD;  Location: [facility] OR;  Service: Orthopedics;  Laterality: Right;   HAND SURGERY Left     HARDWARE REMOVAL Right 07/24/2015   Procedure: HARDWARE REMOVAL RIGHT TIBIA;  Surgeon: Ozell Bruch, MD;  Location: [facility] OR;  Service: Orthopedics;  Laterality: Right;   KNEE ARTHROSCOPY Right 07/24/2015   Procedure: RIGHT ARTHROSCOPY KNEE WITH PARTIAL SYNOVECTOMY AND MEDIAL MENISCECTOMY;  Surgeon: Ozell Bruch, MD;  Location: [facility] OR;  Service: Orthopedics;  Laterality: Right;   LEFT HEART CATH AND CORONARY ANGIOGRAPHY N/A 07/02/2020   Procedure: LEFT HEART CATH AND CORONARY ANGIOGRAPHY;  Surgeon: Burnard Debby LABOR, MD;  Location: [facility] INVASIVE CV LAB;  Service: Cardiovascular;  Laterality: N/A;   LEFT HEART CATH AND CORONARY ANGIOGRAPHY N/A 01/22/2022   Procedure: LEFT HEART CATH AND CORONARY ANGIOGRAPHY;  Surgeon: Darron Deatrice LABOR, MD;  Location: [facility] INVASIVE CV LAB;  Service: Cardiovascular;  Laterality: N/A;   ORIF TIBIA PLATEAU Right 12/19/2014   Procedure: OPEN REDUCTION INTERNAL FIXATION (ORIF) RIGHT TIBIAL PLATEAU;  Surgeon: Ozell Bruch, MD;  Location: [facility] OR;  Service: Orthopedics;  Laterality: Right;   TONSILLECTOMY     VASECTOMY      Allergies  Allergen Reactions   Ozempic  (0.25 Or 0.5 Mg-Dose) [Semaglutide (0.25 Or 0.5mg -Dos)]     Abdominal pain    ROS    Physical Exam: There were no vitals filed for this visit.  General: The  patient is alert and oriented x3 in no acute distress.  Dermatology: Skin is warm, dry and supple bilateral lower extremities. Interspaces are clear of maceration and debris.    Vascular: Palpable pedal pulses bilaterally. Capillary refill within normal limits.  No appreciable edema.  No erythema or calor.  Neurological: Light touch sensation decreased to toes.  Protective sensation decreased.  Vibratory sensation decreased.  Musculoskeletal Exam: Strength 5/5 all major muscle groups.  Right ankle tenderness on palpation of anterior ankle capsule and anterior central ankle gutter, anterior medial and lateral ankle gutter.  No significant palpation of medial and  lateral ankle ligaments, syndesmosis peroneal tendons, fibula or medial malleolus directly.  Radiographic Exam: Right ankle 3 views weightbearing 01/12/2024 Normal osseous mineralization. No fractures or osseous irregularities noted.  Some talotibial joint space narrowing.  Ankle joint mortise otherwise well-preserved and in good alignment.  No significant periarticular spurring.  Assessment/Plan of Care: 1. Synovitis of right ankle   2. Arthritis of ankle, right      Meds ordered this encounter  Medications   triamcinolone  acetonide (KENALOG ) 10 MG/ML injection 10 mg   None  Discussed clinical findings with patient today.  Radiographs reviewed with patient.  Notable for some early signs of osteoarthritis of the ankle with some joint space narrowing of the tibiotalar joint.  Clinical findings consistent with this and with synovitis of the right ankle.  Discussed activity modification, use of supportive ankle bracing, home PT regimen focusing on stretching, range of motion, ankle strengthening as well.  Home PT instructions discussed at length patient with written instructions dispensed and encouraged that he do these exercises twice a day for 15 to 30 minutes each.  Today the patient was fitted and dispensed with a ASO lace up ankle brace.  Verbal consent was obtained to administer corticosteroid injection to the right ankle joint.  Betadine skin prep.  Injected 0.5 cc of 0.5% Marcaine  plain mixed with 10 mg Kenalog .  Band-Aid applied.  Patient tolerated the injection well.  Follow-up in 3 to 4 weeks to see how he is progressing.   Faolan Springfield L. Lamount MAUL, AACFAS [facility]     [address] [place], [place] [address]                Office [phone]  Fax [phone]   Right ankle synovitis/arthritis Injection/brace

## 2024-01-20 ENCOUNTER — Other Ambulatory Visit: Payer: Self-pay

## 2024-01-20 DIAGNOSIS — E1142 Type 2 diabetes mellitus with diabetic polyneuropathy: Secondary | ICD-10-CM

## 2024-01-25 NOTE — Progress Notes (Signed)
 Subjective:  Patient ID: Mitchell Hammond, male    DOB: Sep 08, 1955  Age: 68 y.o. MRN: [id number]  Chief Complaint  Patient presents with   Medical Management of Chronic Issues    Discussed the use of AI scribe software for clinical note transcription with the patient, who gave verbal consent to proceed.  History of Present Illness   Mitchell Hammond is a 68 year old male with diabetes who presents for management of blood sugar levels and associated symptoms.  Glycemic control and diabetes management - Blood glucose levels fluctuate with occasional postprandial spikes, especially after consuming high-carbohydrate foods such as banana pudding and pizza during his birthday - Uses Omnipod 5 insulin  pump in manual mode due to lack of insurance coverage for Dexcom - Monitors blood glucose with the Dash system and adjusts Lispro insulin  boluses accordingly - Occasional hypoglycemia, particularly in the morning, but episodes are less frequent than previously - Takes metformin  1000 mg twice daily and Lispro insulin  via Omnipod  Peripheral neuropathy - Experiences soreness in feet, most pronounced in the morning - Soreness improves as the day progresses - No foot sores present - Takes Lyrica  150 mg twice daily for neuropathy  Hypertension - Takes lisinopril  2.5 mg daily and Toprol  XL 50 mg daily for blood pressure control  Hyperlipidemia - Takes fenofibrate  145 mg daily and atorvastatin  80 mg daily  Gastroesophageal reflux disease - Takes omeprazole  40 mg daily  Nocturia and sleep disturbance - Frequent nocturia disrupts sleep - Uses Ambien  as needed for sleep - No prior use of tamsulosin  or similar medications for nocturia  Chest discomfort - Occasional chest discomfort, uses nitroglycerin  approximately once per month - No use of ibuprofen for foot swelling, prefers to avoid it  Acute upper respiratory symptoms - Recent onset of cough - Grandchildren in household have been ill - No  fever, chills, or other systemic symptoms - Uses over-the-counter nasal spray for congestion - No use of Flonase   Immunization status - Received shingles vaccination but has not completed the second dose  Gastrointestinal symptoms - No abdominal pain or bowel problems         01/26/2024    7:41 AM 10/15/2023    7:36 AM 07/14/2023    8:31 AM 03/31/2023    9:22 AM 11/10/2022   10:10 AM  Depression screen PHQ 2/9  Decreased Interest 0 0 0 0 0  Down, Depressed, Hopeless 0 0 0 0 0  PHQ - 2 Score 0 0 0 0 0  Altered sleeping 3 2 3 2  0  Tired, decreased energy 0 1 0 1 0  Change in appetite 2 0 3 0 0  Feeling bad or failure about yourself  0 0 0 0 0  Trouble concentrating 0 0 0 0 0  Moving slowly or fidgety/restless 0 0 0 0 0  Suicidal thoughts 0 0 0 0 0  PHQ-9 Score 5 3 6 3  0  Difficult doing work/chores Not difficult at all Not difficult at all Not difficult at all Not difficult at all Not difficult at all        01/26/2024    7:41 AM  Fall Risk   Falls in the past year? 0  Number falls in past yr: 0  Injury with Fall? 0  Risk for fall due to : No Fall Risks  Follow up Falls evaluation completed    Patient Care Team: Sherre Clapper, MD as PCP - General (Family Medicine) Revankar, Jennifer SAUNDERS, MD as PCP -  Cardiology (Cardiology) Nyle Rankin POUR, [facility] (Inactive) (Pharmacist)   Review of Systems  All other systems reviewed and are negative.   Current Outpatient Medications on File Prior to Visit  Medication Sig Dispense Refill   aspirin  EC 81 MG tablet Take 81 mg by mouth daily.     atorvastatin  (LIPITOR) 80 MG tablet Take 1 tablet (80 mg total) by mouth daily. 90 tablet 3   cholecalciferol (VITAMIN D3) 25 MCG (1000 UNIT) tablet Take 1 tablet (1,000 Units total) by mouth daily. 30 tablet 1   ciclopirox  (PENLAC ) 8 % solution Apply topically at bedtime. Apply over nail and surrounding skin. Apply daily over previous coat. After seven (7) days, may remove with alcohol and continue  cycle. 6.6 mL 0   Continuous Glucose Receiver (DEXCOM G6 RECEIVER) DEVI 1 each by Does not apply route daily. 1 each 0   Continuous Glucose Sensor (DEXCOM G7 SENSOR) MISC Every 10 days 9 each 3   fenofibrate  (TRICOR ) 145 MG tablet Take 1 tablet (145 mg total) by mouth at bedtime. 90 tablet 0   Glucagon  (GVOKE HYPOPEN  1-PACK) 1 MG/0.2ML SOAJ INJECT 1 DOSE SUBCUTANEOUSLY AS NEEDED (HYPOGLYCEMIA) 1 mL 3   Insulin  Disposable Pump (OMNIPOD 5 G7 INTRO, GEN 5,) KIT 1 kit by Does not apply route as directed. 1 kit 0   Insulin  Disposable Pump (OMNIPOD 5 G7 PODS, GEN 5,) MISC Inject 1 each into the skin as directed. Every 2 days 15 each 5   Insulin  Disposable Pump (OMNIPOD DASH PODS, GEN 4,) MISC 1 each by Does not apply route every 3 (three) days. 10 each 0   insulin  lispro (HUMALOG ) 100 UNIT/ML injection ON OMNIPOD.  200 UNITS PLUS EVERY 2 DAYS 20 mL 0   lisinopril  (ZESTRIL ) 2.5 MG tablet Take 1 tablet (2.5 mg total) by mouth daily. 90 tablet 0   metFORMIN  (GLUCOPHAGE ) 1000 MG tablet Take 1 tablet (1,000 mg total) by mouth 2 (two) times daily. 180 tablet 0   methocarbamol  (ROBAXIN ) 750 MG tablet Take 1 tablet (750 mg total) by mouth every 6 (six) hours as needed (TMJ). 120 tablet 2   metoprolol  succinate (TOPROL -XL) 50 MG 24 hr tablet Take 1 tablet (50 mg total) by mouth daily. Take with or immediately following a meal. 90 tablet 3   mupirocin  ointment (BACTROBAN ) 2 % Apply topically 2 (two) times daily. 22 g 6   nitroGLYCERIN  (NITROSTAT ) 0.4 MG SL tablet Place 1 tablet (0.4 mg total) under the tongue every 5 (five) minutes as needed for chest pain. 25 tablet 8   ondansetron  (ZOFRAN ) 4 MG tablet Take 1 tablet (4 mg total) by mouth every 8 (eight) hours as needed for nausea or vomiting. 20 tablet 0   ONETOUCH VERIO test strip 1 each by Other route daily. 100 each 3   pregabalin  (LYRICA ) 150 MG capsule Take 1 capsule (150 mg total) by mouth 2 (two) times daily. 60 capsule 3   SHINGRIX injection      vitamin  C (VITAMIN C ) 500 MG tablet Take 1 tablet (500 mg total) by mouth 2 (two) times daily. 60 tablet 2   zolpidem  (AMBIEN ) 10 MG tablet Take 1 tablet (10 mg total) by mouth at bedtime as needed for sleep. 30 tablet 5   No current facility-administered medications on file prior to visit.   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 07/05/2019   Diabetes mellitus due to underlying condition with unspecified complications (HCC) 12/26/2015   Diabetic polyneuropathy associated with type 2 diabetes mellitus (HCC) 08/20/2016   Discitis    Essential hypertension 12/26/2015   Fracture, tibial plateau 12/18/2014   Hypertension    IDDM (insulin  dependent diabetes mellitus)    type 2   Metatarsalgia of both feet 08/20/2016   MI (myocardial infarction) (HCC)    x 2, Multi-link Vision 3.5 x 15 mm BMS in 2009 (mid LAD), balloon 2015   Mixed hyperlipidemia 06/14/2020   Obesity (BMI 30.0-34.9) 06/26/2020   Tibial plateau fracture 12/18/2014   Vitamin D  insufficiency    Past Surgical History:  Procedure Laterality Date   BACK SURGERY     2014   CARDIAC CATHETERIZATION     x 2, 2009 & 2015   COLONOSCOPY     CORONARY ANGIOPLASTY     stent - 2008, balloon - 2015   FASCIOTOMY Right 12/19/2014   Procedure: ANTERIOR COMPARTMENTAL FASCIOTOMY;  Surgeon: Ozell Bruch, MD;  [facility] OR;  Service: Orthopedics;  Laterality: Right;   HAND SURGERY Left    HARDWARE REMOVAL Right 07/24/2015   Procedure: HARDWARE REMOVAL RIGHT TIBIA;  Surgeon: Ozell Bruch, MD;  Location: [facility] OR;  Service: Orthopedics;  Laterality: Right;   KNEE ARTHROSCOPY Right 07/24/2015   Procedure: RIGHT ARTHROSCOPY KNEE WITH PARTIAL SYNOVECTOMY AND MEDIAL MENISCECTOMY;  Surgeon: Ozell Bruch, MD;   Location: [facility] OR;  Service: Orthopedics;  Laterality: Right;   LEFT HEART CATH AND CORONARY ANGIOGRAPHY N/A 07/02/2020   Procedure: LEFT HEART CATH AND CORONARY ANGIOGRAPHY;  Surgeon: Burnard Debby LABOR, MD;  Location: [facility] INVASIVE CV LAB;  Service: Cardiovascular;  Laterality: N/A;   LEFT HEART CATH AND CORONARY ANGIOGRAPHY N/A 01/22/2022   Procedure: LEFT HEART CATH AND CORONARY ANGIOGRAPHY;  Surgeon: Darron Deatrice LABOR, MD;  Location: [facility] INVASIVE CV LAB;  Service: Cardiovascular;  Laterality: N/A;   ORIF TIBIA PLATEAU Right 12/19/2014   Procedure: OPEN REDUCTION INTERNAL FIXATION (ORIF) RIGHT TIBIAL PLATEAU;  Surgeon: Ozell Bruch, MD;  Location: [facility] OR;  Service: Orthopedics;  Laterality: Right;   TONSILLECTOMY     VASECTOMY      Family History  Problem Relation Age of Onset   Diabetes Mother    CAD Mother    Liver disease Father    Social History   Socioeconomic History   Marital status: Married    Spouse name: Not on file   Number of children: 2   Years of education: Not on file   Highest education level: Not on file  Occupational History   Occupation: disabled  Tobacco Use   Smoking status: Former    Current packs/day: 0.00    Types: Cigarettes    Quit date: 02/20/2007    Years since quitting: 16.9   Smokeless tobacco: Never  Vaping Use   Vaping status: Never Used  Substance and Sexual Activity   Alcohol use: No    Alcohol/week: 0.0 standard drinks of alcohol    Comment: None in years   Drug use: No   Sexual activity: Yes    Partners: Female  Other Topics Concern   Not on file  Social History Narrative   Pt lives with wife and daughter in [place].    Social Drivers of Corporate investment banker Strain: Low  Risk  (11/14/2021)   Overall Financial Resource Strain (CARDIA)    Difficulty of Paying Living Expenses: Not hard at all  Food Insecurity: No Food Insecurity (08/29/2020)   Hunger Vital Sign    Worried About Running Out of Food in the Last Year: Never true     Ran Out of Food in the Last Year: Never true  Transportation Needs: No Transportation Needs (11/14/2021)   PRAPARE - Administrator, Civil Service (Medical): No    Lack of Transportation (Non-Medical): No  Physical Activity: Inactive (12/17/2022)   Exercise Vital Sign    Days of Exercise per Week: 0 days    Minutes of Exercise per Session: 0 min  Stress: No Stress Concern Present (06/24/2022)   [employer] of Occupational Health - Occupational Stress Questionnaire    Feeling of Stress : Not at all  Social Connections: Moderately Integrated (06/24/2022)   Social Connection and Isolation Panel    Frequency of Communication with Friends and Family: More than three times a week    Frequency of Social Gatherings with Friends and Family: More than three times a week    Attends Religious Services: More than 4 times per year    Active Member of [employer] or Organizations: No    Attends Engineer, structural: Never    Marital Status: Married    Objective:  BP 132/66   Pulse 73   Temp 98.2 F (36.8 C) (Temporal)   Resp 16   Ht 5' 5 (1.651 m)   Wt 196 lb (88.9 kg)   SpO2 96%   BMI 32.62 kg/m      01/26/2024    7:41 AM 10/15/2023    7:29 AM 07/23/2023   10:08 AM  BP/Weight  Systolic BP 132 118 185  Diastolic BP 66 60 79  Wt. (Lbs) 196 198.6   BMI 32.62 kg/m2 33.05 kg/m2     Physical Exam Vitals reviewed.  Constitutional:      Appearance: Normal appearance.  HENT:     Right Ear: Tympanic membrane, ear canal and external ear normal.     Left Ear: Tympanic membrane, ear canal and external ear normal.     Nose: Congestion present. No rhinorrhea.     Comments: Erythematous turbinates.     Mouth/Throat:     Mouth: Mucous membranes are moist.     Pharynx: No oropharyngeal exudate or posterior oropharyngeal erythema.  Neck:     Vascular: No carotid bruit.  Cardiovascular:     Rate and Rhythm: Normal rate and regular rhythm.     Pulses: Normal pulses.     Heart  sounds: Normal heart sounds.  Pulmonary:     Effort: Pulmonary effort is normal. No respiratory distress.     Breath sounds: Normal breath sounds. No wheezing, rhonchi or rales.  Abdominal:     General: Bowel sounds are normal.     Palpations: Abdomen is soft.     Tenderness: There is no abdominal tenderness.  Neurological:     Mental Status: He is alert and oriented to person, place, and time.  Psychiatric:        Mood and Affect: Mood normal.        Behavior: Behavior normal.      Diabetic foot exam was performed with the following findings:   No deformities, ulcerations, or other skin breakdown Intact posterior tibialis and dorsalis pedis pulses Thickened nails, calluses.  Decreased sensation BL.       Lab  Results  Component Value Date   WBC 5.6 10/15/2023   HGB 14.1 10/15/2023   HCT 43.1 10/15/2023   PLT 176 10/15/2023   GLUCOSE 135 (H) 10/15/2023   CHOL 110 10/15/2023   TRIG 76 10/15/2023   HDL 31 (L) 10/15/2023   LDLCALC 63 10/15/2023   ALT 26 10/15/2023   AST 24 10/15/2023   NA 142 10/15/2023   K 4.5 10/15/2023   CL 107 (H) 10/15/2023   CREATININE 1.06 10/15/2023   BUN 12 10/15/2023   CO2 19 (L) 10/15/2023   TSH 2.829 12/18/2014   INR 1.20 12/18/2014   HGBA1C 8.4 (A) 01/26/2024      Component Ref Range & Units (hover) 6 d ago (01/26/2024)  TC 141  HDL 37  TRG 131  LDL 78  Non-HDL 104       Assessment & Plan:  Diabetic polyneuropathy associated with type 2 diabetes mellitus (HCC) -     POCT glycosylated hemoglobin (Hb A1C) -     Microalbumin / creatinine urine ratio  Hypertensive heart disease without heart failure  Mixed hyperlipidemia -     POCT Lipid Panel  Benign prostatic hyperplasia with nocturia Assessment & Plan: - Prescribe tamsulosin  for nocturia.  Orders: -     Tamsulosin  HCl; Take 1 capsule (0.4 mg total) by mouth daily after supper.  Dispense: 90 capsule; Refill: 1  Non-seasonal allergic rhinitis due to pollen -      Fluticasone  Propionate; Place 2 sprays into both nostrils daily.  Dispense: 16 g; Refill: 6  Encounter for immunization -     Flu vaccine HIGH DOSE PF(Fluzone Trivalent)  Chronic insomnia  Acute nasopharyngitis Assessment & Plan: Start Flonase  and tessalon  perles.  - Discontinue Afrin nasal spray as it can cause rebound congestion.  Orders: -     Benzonatate ; Take 1 capsule (200 mg total) by mouth 3 (three) times daily as needed for cough.  Dispense: 50 capsule; Refill: 0  Peripheral edema Assessment & Plan: Recommend moderate compression socks (18-20 mmHg).     Assessment and Plan  Type 2 diabetes mellitus with diabetic polyneuropathy Poorly controlled. WORSENED. A1C 9.2.  Diabetes management improved with Omnipod 5 and Lispro insulin . Neuropathy managed with Lyrica . Occasional morning hypoglycemia noted. - Diabetes improved with when using dexcom G7, but unable afford it at the pharmacy.  - Attempt to obtain Dexcom through durable medical equipment company. - Continue Omnipod 5 with Lispro insulin . Provided dexcom G7 sample.  - Continue Lyrica  150 mg twice daily.  Hypertensive heart disease without heart failure Blood pressure controlled with lisinopril  and Toprol  XL. - Continue lisinopril  2.5 mg daily. - Continue Toprol  XL 50 mg daily.  Mixed hyperlipidemia Lipid levels managed well with atorvastatin  and fenofibrate . - Continue atorvastatin  80 mg daily. - Continue fenofibrate  145 mg daily.  Benign prostatic hyperplasia with lower urinary tract symptoms Nocturia likely due to benign prostatic hyperplasia. Tamsulosin  discussed to reduce nocturia and improve sleep. - Prescribe tamsulosin  for nocturia. - Monitor blood pressure due to potential hypotensive effects of tamsulosin .  Chronic insomnia Intermittent Ambien  use causes hangover effect. Nocturia contributes to sleep disturbances. Tamsulosin  may improve sleep quality. - Consider reducing Ambien  dose or frequency. -  Prescribe tamsulosin  to reduce nocturia and improve sleep.  Cough and upper respiratory symptoms Cough likely viral. Afrin use may cause rebound congestion. Flonase  recommended to avoid rebound effects. - Prescribe Flonase  nasal spray. - Discontinue Afrin nasal spray. - Prescribe tessalon  perles for symptomatic relief.  Peripheral edema of  lower extremities Swelling in feet after prolonged standing. Compression socks discussed for edema management. - Recommend moderate compression socks (18-20 mmHg). - Consider purchasing compression socks from local supplier or [employer].  General Health Maintenance Due for flu shot and COVID vaccine. Shingles vaccine series discussed. Eye exam completed. - Administer flu shot. - Recommend COVID vaccine when available. - Recommend second dose of shingles vaccine.    Meds ordered this encounter  Medications   fluticasone  (FLONASE ) 50 MCG/ACT nasal spray    Sig: Place 2 sprays into both nostrils daily.    Dispense:  16 g    Refill:  6   tamsulosin  (FLOMAX ) 0.4 MG CAPS capsule    Sig: Take 1 capsule (0.4 mg total) by mouth daily after supper.    Dispense:  90 capsule    Refill:  1   benzonatate  (TESSALON ) 200 MG capsule    Sig: Take 1 capsule (200 mg total) by mouth 3 (three) times daily as needed for cough.    Dispense:  50 capsule    Refill:  0    Orders Placed This Encounter  Procedures   Flu vaccine HIGH DOSE PF(Fluzone Trivalent)   Microalbumin / creatinine urine ratio   POCT Lipid Panel   POCT glycosylated hemoglobin (Hb A1C)     Follow-up: Return in about 3 months (around 04/26/2024) for chronic follow up.    An After Visit Summary was printed and given to the patient.  Abigail Free, MD [facility] [phone]

## 2024-01-26 ENCOUNTER — Ambulatory Visit (INDEPENDENT_AMBULATORY_CARE_PROVIDER_SITE_OTHER): Admitting: Family Medicine

## 2024-01-26 ENCOUNTER — Encounter: Payer: Self-pay | Admitting: Family Medicine

## 2024-01-26 VITALS — BP 132/66 | HR 73 | Temp 98.2°F | Resp 16 | Ht 65.0 in | Wt 196.0 lb

## 2024-01-26 DIAGNOSIS — F5104 Psychophysiologic insomnia: Secondary | ICD-10-CM | POA: Diagnosis not present

## 2024-01-26 DIAGNOSIS — N401 Enlarged prostate with lower urinary tract symptoms: Secondary | ICD-10-CM | POA: Diagnosis not present

## 2024-01-26 DIAGNOSIS — I119 Hypertensive heart disease without heart failure: Secondary | ICD-10-CM

## 2024-01-26 DIAGNOSIS — Z23 Encounter for immunization: Secondary | ICD-10-CM

## 2024-01-26 DIAGNOSIS — J301 Allergic rhinitis due to pollen: Secondary | ICD-10-CM | POA: Diagnosis not present

## 2024-01-26 DIAGNOSIS — E782 Mixed hyperlipidemia: Secondary | ICD-10-CM | POA: Diagnosis not present

## 2024-01-26 DIAGNOSIS — R351 Nocturia: Secondary | ICD-10-CM | POA: Diagnosis not present

## 2024-01-26 DIAGNOSIS — R600 Localized edema: Secondary | ICD-10-CM

## 2024-01-26 DIAGNOSIS — J00 Acute nasopharyngitis [common cold]: Secondary | ICD-10-CM

## 2024-01-26 DIAGNOSIS — E1142 Type 2 diabetes mellitus with diabetic polyneuropathy: Secondary | ICD-10-CM

## 2024-01-26 LAB — POCT GLYCOSYLATED HEMOGLOBIN (HGB A1C): Hemoglobin A1C: 8.4 % — AB (ref 4.0–5.6)

## 2024-01-26 LAB — POCT LIPID PANEL
HDL: 37
LDL: 78
Non-HDL: 104
TC/HDL: 2.1
TC: 141
TRG: 131

## 2024-01-26 MED ORDER — FLUTICASONE PROPIONATE 50 MCG/ACT NA SUSP: 2.0000 | Freq: Every day | NASAL | 6 refills | Status: AC

## 2024-01-26 MED ORDER — BENZONATATE 200 MG PO CAPS: 200.0000 mg | ORAL_CAPSULE | Freq: Three times a day (TID) | ORAL | 0 refills | Status: DC | PRN

## 2024-01-26 MED ORDER — TAMSULOSIN HCL 0.4 MG PO CAPS: 0.4000 mg | ORAL_CAPSULE | Freq: Every day | ORAL | 1 refills | Status: DC

## 2024-01-26 NOTE — Patient Instructions (Addendum)
  VISIT SUMMARY: Today, we discussed the management of your diabetes, blood pressure, cholesterol, nocturia, sleep issues, and recent upper respiratory symptoms. We also reviewed your immunization status and recommended some updates.  YOUR PLAN: TYPE 2 DIABETES MELLITUS WITH DIABETIC POLYNEUROPATHY: Your diabetes management is improving with the use of the Omnipod 5 insulin  pump and Lispro insulin . You are also taking Lyrica  for neuropathy. -We will attempt to obtain a Dexcom through a durable medical equipment company called [employer]. -Continue using the Omnipod 5 with Lispro insulin . -Continue taking Lyrica  150 mg twice daily. -Your A1c levels was 8.4. Hopefully by getting you the dexcom g7 this will improve. -We will provide a sample Dexcom if available.  HYPERTENSIVE HEART DISEASE WITHOUT HEART FAILURE: Your blood pressure is well controlled with your current medications. -Continue taking lisinopril  2.5 mg daily. -Continue taking Toprol  XL 50 mg daily.  MIXED HYPERLIPIDEMIA: Your cholesterol levels are managed with atorvastatin  and fenofibrate . -Continue taking atorvastatin  80 mg daily. -Continue taking fenofibrate  145 mg daily. -Your cholesterol  is excellent.   BENIGN PROSTATIC HYPERPLASIA WITH LOWER URINARY TRACT SYMPTOMS: Your frequent nighttime urination is likely due to an enlarged prostate. -We will prescribe tamsulosin  4 mg before bed to help reduce nocturia and improve your sleep. -Monitor your blood pressure as tamsulosin  can cause low blood pressure.  CHRONIC INSOMNIA: Your sleep is disturbed by nocturia and the occasional use of Ambien . -Consider reducing the dose or frequency of Ambien . -We will prescribe tamsulosin  to help reduce nocturia and improve your sleep.  CHRONIC COUGH AND UPPER RESPIRATORY SYMPTOMS: Your cough is likely viral, and the use of Afrin may cause rebound congestion. -We will prescribe Flonase  nasal spray. -Discontinue using Afrin nasal spray. -We will  prescribe cough pearls for symptomatic relief.  PERIPHERAL EDEMA OF LOWER EXTREMITIES: You experience swelling in your feet after prolonged standing. -We recommend using moderate compression socks (18-20 mmHg). -Consider purchasing compression socks from a local supplier or [employer].  GENERAL HEALTH MAINTENANCE: You are due for some vaccinations and have completed your eye exam. -We will administer a flu shot today. -We recommend getting the COVID vaccine when it is available. -We recommend getting the second dose of the shingles vaccine at your pharmacu.                      Contains text generated by Abridge.                                 Contains text generated by Abridge.

## 2024-01-27 LAB — MICROALBUMIN / CREATININE URINE RATIO
Creatinine, Urine: 112.8 mg/dL
Microalb/Creat Ratio: 196 mg/g{creat} — ABNORMAL HIGH (ref 0–29)
Microalbumin, Urine: 221.6 ug/mL

## 2024-01-31 ENCOUNTER — Ambulatory Visit: Payer: Self-pay | Admitting: Family Medicine

## 2024-01-31 DIAGNOSIS — E1165 Type 2 diabetes mellitus with hyperglycemia: Secondary | ICD-10-CM

## 2024-02-01 ENCOUNTER — Other Ambulatory Visit: Payer: Self-pay | Admitting: Family Medicine

## 2024-02-01 ENCOUNTER — Other Ambulatory Visit: Payer: Self-pay

## 2024-02-01 DIAGNOSIS — J301 Allergic rhinitis due to pollen: Secondary | ICD-10-CM | POA: Insufficient documentation

## 2024-02-01 DIAGNOSIS — K2100 Gastro-esophageal reflux disease with esophagitis, without bleeding: Secondary | ICD-10-CM

## 2024-02-01 DIAGNOSIS — N401 Enlarged prostate with lower urinary tract symptoms: Secondary | ICD-10-CM | POA: Insufficient documentation

## 2024-02-01 DIAGNOSIS — F5104 Psychophysiologic insomnia: Secondary | ICD-10-CM | POA: Insufficient documentation

## 2024-02-01 DIAGNOSIS — R600 Localized edema: Secondary | ICD-10-CM | POA: Insufficient documentation

## 2024-02-01 DIAGNOSIS — J00 Acute nasopharyngitis [common cold]: Secondary | ICD-10-CM | POA: Insufficient documentation

## 2024-02-01 MED ORDER — DAPAGLIFLOZIN PROPANEDIOL 5 MG PO TABS: 5.0000 mg | ORAL_TABLET | Freq: Every day | ORAL | 3 refills | Status: DC

## 2024-02-01 NOTE — Assessment & Plan Note (Signed)
-   Prescribe tamsulosin  for nocturia.

## 2024-02-01 NOTE — Assessment & Plan Note (Signed)
 Recommend moderate compression socks (18-20 mmHg).

## 2024-02-01 NOTE — Assessment & Plan Note (Signed)
 Start Flonase  and tessalon  perles.  - Discontinue Afrin nasal spray as it can cause rebound congestion.

## 2024-02-16 ENCOUNTER — Encounter: Payer: Self-pay | Admitting: Podiatry

## 2024-02-16 ENCOUNTER — Ambulatory Visit (INDEPENDENT_AMBULATORY_CARE_PROVIDER_SITE_OTHER): Admitting: Podiatry

## 2024-02-16 DIAGNOSIS — M65971 Unspecified synovitis and tenosynovitis, right ankle and foot: Secondary | ICD-10-CM

## 2024-02-16 DIAGNOSIS — M19071 Primary osteoarthritis, right ankle and foot: Secondary | ICD-10-CM | POA: Diagnosis not present

## 2024-02-16 NOTE — Progress Notes (Signed)
 Chief Complaint  Patient presents with   Ankle Synovitis    Right ankle synovitis. Doing well, the injection helped, he is not feeling the catching as much. He thinks he may not need a repeat injection today, but said we now know what helps it.  A1c was 8.1 in Sept.  ASA    HPI: 68 y.o. male presents today following up after right ankle injection.  He reports it has had pretty good relief of his symptoms.  Has not noticed catching sensations.  He is not using the ankle brace as frequently and reports doing well with this.  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 07/05/2019   Diabetes mellitus due to underlying condition with unspecified complications (HCC) 12/26/2015   Diabetic polyneuropathy associated with type 2 diabetes mellitus (HCC) 08/20/2016   Discitis    Essential hypertension 12/26/2015   Fracture, tibial plateau 12/18/2014   Hypertension    IDDM (insulin  dependent diabetes mellitus)    type 2   Metatarsalgia of both feet 08/20/2016   MI (myocardial infarction) (HCC)    x 2, Multi-link Vision 3.5 x 15 mm BMS in 2009 (mid LAD), balloon 2015   Mixed hyperlipidemia 06/14/2020   Obesity (BMI 30.0-34.9) 06/26/2020   Tibial plateau fracture 12/18/2014   Vitamin D  insufficiency     Past Surgical History:  Procedure Laterality Date   BACK SURGERY     2014   CARDIAC CATHETERIZATION     x 2, 2009 & 2015   COLONOSCOPY     CORONARY ANGIOPLASTY     stent - 2008, balloon - 2015   FASCIOTOMY Right 12/19/2014   Procedure: ANTERIOR COMPARTMENTAL FASCIOTOMY;  Surgeon: Ozell Bruch, MD;  Location: [facility] OR;  Service: Orthopedics;  Laterality: Right;   HAND SURGERY Left    HARDWARE REMOVAL Right 07/24/2015   Procedure: HARDWARE REMOVAL RIGHT  TIBIA;  Surgeon: Ozell Bruch, MD;  Location: [facility] OR;  Service: Orthopedics;  Laterality: Right;   KNEE ARTHROSCOPY Right 07/24/2015   Procedure: RIGHT ARTHROSCOPY KNEE WITH PARTIAL SYNOVECTOMY AND MEDIAL MENISCECTOMY;  Surgeon: Ozell Bruch, MD;  Location: [facility] OR;  Service: Orthopedics;  Laterality: Right;   LEFT HEART CATH AND CORONARY ANGIOGRAPHY N/A 07/02/2020   Procedure: LEFT HEART CATH AND CORONARY ANGIOGRAPHY;  Surgeon: Burnard Debby LABOR, MD;  Location: [facility];  Service: Cardiovascular;  Laterality: N/A;   LEFT HEART CATH AND CORONARY ANGIOGRAPHY N/A 01/22/2022   Procedure: LEFT HEART CATH AND CORONARY ANGIOGRAPHY;  Surgeon: Darron Deatrice LABOR, MD;  Location: [facility] LAB;  Service: Cardiovascular;  Laterality: N/A;   ORIF TIBIA PLATEAU Right 12/19/2014   Procedure: OPEN REDUCTION INTERNAL FIXATION (ORIF) RIGHT TIBIAL PLATEAU;  Surgeon: Ozell Bruch, MD;  Location: [facility] OR;  Service: Orthopedics;  Laterality: Right;   TONSILLECTOMY     VASECTOMY      Allergies  Allergen Reactions   Ozempic  (0.25 Or 0.5 Mg-Dose) [Semaglutide (0.25 Or 0.5mg -Dos)]     Abdominal pain    ROS    Physical Exam: There were no vitals filed for this visit.  General: The patient is alert and oriented x3 in no acute distress.  Dermatology: Skin is warm, dry and supple bilateral  lower extremities. Interspaces are clear of maceration and debris.    Vascular: Palpable pedal pulses bilaterally. Capillary refill within normal limits.  No appreciable edema.  No erythema or calor.  Neurological: Light touch sensation decreased to toes.  Protective sensation decreased.  Vibratory sensation decreased.  Musculoskeletal Exam: Strength 5/5 all major muscle groups.  No tenderness on palpation of anterior ankle joint structures to this point.  No crepitation with ankle joint range of motion.  No pain associated with dorsiflexion, plantarflexion, inversion, eversion.  Radiographic Exam: Right ankle 3  views weightbearing 01/12/2024 Normal osseous mineralization. No fractures or osseous irregularities noted.  Some talotibial joint space narrowing.  Ankle joint mortise otherwise well-preserved and in good alignment.  No significant periarticular spurring.  Assessment/Plan of Care: 1. Synovitis of right ankle   2. Arthritis of ankle, right      No orders of the defined types were placed in this encounter.  None  Discussed clinical findings with patient today.  He has had good relief to his right ankle pain at this point.  Did discuss that some of these issues may be chronic in nature.  Did discuss some home ankle instability exercises that he may work on at home with an instructions print out and dispensed.  RICE therapy and brace as needed.  He does have upcoming follow-up in about a month for diabetic footcare and we will also see how he is doing with the right ankle at this point in time.   Briyana Badman L. Lamount MAUL, AACFAS [facility]     [address] [place], [place] [address]                Office [phone]  Fax [phone]

## 2024-02-28 ENCOUNTER — Other Ambulatory Visit: Payer: Self-pay | Admitting: Family Medicine

## 2024-02-28 DIAGNOSIS — K2100 Gastro-esophageal reflux disease with esophagitis, without bleeding: Secondary | ICD-10-CM

## 2024-03-05 ENCOUNTER — Other Ambulatory Visit: Payer: Self-pay | Admitting: Family Medicine

## 2024-03-05 DIAGNOSIS — E1142 Type 2 diabetes mellitus with diabetic polyneuropathy: Secondary | ICD-10-CM

## 2024-03-07 ENCOUNTER — Ambulatory Visit: Admitting: Podiatry

## 2024-03-14 ENCOUNTER — Other Ambulatory Visit: Payer: Self-pay | Admitting: Family Medicine

## 2024-03-14 DIAGNOSIS — E1142 Type 2 diabetes mellitus with diabetic polyneuropathy: Secondary | ICD-10-CM

## 2024-03-23 ENCOUNTER — Other Ambulatory Visit: Payer: Self-pay | Admitting: Family Medicine

## 2024-03-23 DIAGNOSIS — E782 Mixed hyperlipidemia: Secondary | ICD-10-CM

## 2024-04-01 ENCOUNTER — Other Ambulatory Visit: Payer: Self-pay | Admitting: Family Medicine

## 2024-04-01 DIAGNOSIS — E1142 Type 2 diabetes mellitus with diabetic polyneuropathy: Secondary | ICD-10-CM

## 2024-04-05 ENCOUNTER — Other Ambulatory Visit: Payer: Self-pay

## 2024-04-05 DIAGNOSIS — E1142 Type 2 diabetes mellitus with diabetic polyneuropathy: Secondary | ICD-10-CM

## 2024-04-12 DIAGNOSIS — W298XXA Contact with other powered powered hand tools and household machinery, initial encounter: Secondary | ICD-10-CM | POA: Diagnosis not present

## 2024-04-12 DIAGNOSIS — S61211A Laceration without foreign body of left index finger without damage to nail, initial encounter: Secondary | ICD-10-CM | POA: Diagnosis not present

## 2024-04-17 ENCOUNTER — Other Ambulatory Visit: Payer: Self-pay | Admitting: Family Medicine

## 2024-04-17 DIAGNOSIS — E1142 Type 2 diabetes mellitus with diabetic polyneuropathy: Secondary | ICD-10-CM

## 2024-04-18 ENCOUNTER — Telehealth: Payer: Self-pay | Admitting: Family Medicine

## 2024-04-18 NOTE — Telephone Encounter (Signed)
-----   Message from Abigail Free sent at 04/17/2024  7:49 PM EST ----- Regarding: ED follow up Patient needs follow up appointment from ED for 12/3-12/5. He had a laceration and needs suture removal. Dr. Free

## 2024-04-18 NOTE — Telephone Encounter (Signed)
 Called patient to schedule a hospital follow up for a suture removal. Left a voicemail for the patient to call the office back to schedule.

## 2024-04-19 ENCOUNTER — Other Ambulatory Visit (HOSPITAL_BASED_OUTPATIENT_CLINIC_OR_DEPARTMENT_OTHER): Payer: Self-pay

## 2024-04-19 ENCOUNTER — Inpatient Hospital Stay: Admitting: Family Medicine

## 2024-04-19 ENCOUNTER — Encounter (HOSPITAL_BASED_OUTPATIENT_CLINIC_OR_DEPARTMENT_OTHER): Payer: Self-pay

## 2024-04-19 ENCOUNTER — Ambulatory Visit (HOSPITAL_BASED_OUTPATIENT_CLINIC_OR_DEPARTMENT_OTHER)
Admission: EM | Admit: 2024-04-19 | Discharge: 2024-04-19 | Disposition: A | Discharge status: Home / Self Care | Attending: Family Medicine | Admitting: Family Medicine

## 2024-04-19 DIAGNOSIS — J0100 Acute maxillary sinusitis, unspecified: Secondary | ICD-10-CM | POA: Diagnosis not present

## 2024-04-19 DIAGNOSIS — R051 Acute cough: Secondary | ICD-10-CM

## 2024-04-19 MED ORDER — AMOXICILLIN 875 MG PO TABS
875.0000 mg | ORAL_TABLET | Freq: Two times a day (BID) | ORAL | 0 refills | Status: AC
  Filled 2024-04-19: qty 14, 7d supply, fill #0

## 2024-04-19 MED ORDER — DEXAMETHASONE SOD PHOSPHATE PF 10 MG/ML IJ SOLN
10.0000 mg | Freq: Once | INTRAMUSCULAR | Status: AC
  Administered 2024-04-19: 10 mg via INTRAMUSCULAR

## 2024-04-19 MED ORDER — BENZONATATE 100 MG PO CAPS
200.0000 mg | ORAL_CAPSULE | Freq: Three times a day (TID) | ORAL | 0 refills | Status: AC
  Filled 2024-04-19: qty 30, 5d supply, fill #0

## 2024-04-19 NOTE — ED Triage Notes (Signed)
 Ot c/o cough, bilateral eye pain/pressure, nasal congestion, HA, sore throat, body aches, and low grade fever- resolved for the last week. cough is described as productive at times and dry/hacking. He has test neg for COVID at home. Pt has taken mucinex, cough drops, and tylenol  with slight relief.   Pt also states he cut his left pointer finger last week and had stitches placed at the ED. He would like them removed today.

## 2024-04-19 NOTE — Discharge Instructions (Signed)
 We are treating for bacterial sinus infection with amoxicillin .  Tessalon  Perles for cough as needed.  Steroid injection given here today for sinus inflammation. Can continue Mucinex over-the-counter as needed.  Follow-up as needed

## 2024-04-19 NOTE — ED Provider Notes (Signed)
 [facility]    CSN: [id number] Arrival date & time: 04/19/24  0814      History   Chief Complaint Chief Complaint  Patient presents with   Cough   Suture / Staple Removal    HPI Mitchell Hammond is a 68 y.o. male.   Patient is a 68 year old male who presents today with c/o cough, bilateral eye pain/pressure, nasal congestion, HA, sore throat, body aches, and low grade fever.  Worsened over the last week.  Cough is described as productive at times and dry/hacking. He has test neg for COVID at home. Pt has taken mucinex, cough drops, and tylenol  with slight relief.  Cough is keeping up at night   Pt also states he cut his left pointer finger last week and had stitches placed at the ED. He would like them removed today.     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 07/05/2019   Diabetes mellitus due to underlying condition with unspecified complications (HCC) 12/26/2015   Diabetic polyneuropathy associated with type 2 diabetes mellitus (HCC) 08/20/2016   Discitis    Essential hypertension 12/26/2015   Fracture, tibial plateau 12/18/2014   Hypertension    IDDM (insulin  dependent diabetes mellitus)    type 2   Metatarsalgia of both feet 08/20/2016   MI (myocardial infarction) (HCC)    x 2, Multi-link Vision 3.5 x 15 mm BMS in 2009 (mid LAD), balloon 2015   Mixed hyperlipidemia 06/14/2020   Obesity (BMI 30.0-34.9) 06/26/2020   Tibial plateau fracture 12/18/2014   Vitamin D  insufficiency     Patient Active Problem List   Diagnosis Date Noted   Benign prostatic hyperplasia with nocturia 02/01/2024   Non-seasonal allergic rhinitis due to pollen 02/01/2024   Acute nasopharyngitis 02/01/2024    Peripheral edema 02/01/2024   Chronic insomnia 02/01/2024   Scab 10/17/2023   Atypical pigmented lesion 10/17/2023   Nausea 10/17/2023   Onychomycosis 04/01/2023   Dislocation of jaw 04/01/2023   Nail wound of sole of foot 04/01/2023   Episodic cluster headache, intractable 12/17/2022   Severe headache 12/17/2022   Right leg pain 11/11/2022   Primary insomnia 11/11/2022   Shortness of breath 06/28/2022   Screening for colon cancer 06/28/2022   GERD (gastroesophageal reflux disease) 02/04/2022   BMI 31.0-31.9,adult 04/24/2021   Discitis    MI (myocardial infarction) (HCC)    Mixed hyperlipidemia 06/14/2020   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 07/05/2019   Chip fracture of triquetral bone of right wrist, closed, initial encounter 12/09/2016   Contracture of tendon sheath 08/20/2016   Metatarsalgia of both feet 08/20/2016   Hypertensive heart disease 12/26/2015   Diabetic polyneuropathy (HCC) 12/26/2015   Vitamin D  insufficiency 12/20/2014   CAD (coronary artery disease)    Chronic back pain     Past Surgical History:  Procedure Laterality Date   BACK SURGERY     2014   CARDIAC CATHETERIZATION     x 2, 2009 & 2015   COLONOSCOPY     CORONARY ANGIOPLASTY     stent - 2008, balloon - 2015   FASCIOTOMY Right 12/19/2014   Procedure: ANTERIOR COMPARTMENTAL FASCIOTOMY;  Surgeon:  Ozell Bruch, MD;  Location: [facility] OR;  Service: Orthopedics;  Laterality: Right;   HAND SURGERY Left    HARDWARE REMOVAL Right 07/24/2015   Procedure: HARDWARE REMOVAL RIGHT TIBIA;  Surgeon: Ozell Bruch, MD;  Location: [facility] OR;  Service: Orthopedics;  Laterality: Right;   KNEE ARTHROSCOPY Right 07/24/2015   Procedure: RIGHT ARTHROSCOPY KNEE WITH PARTIAL SYNOVECTOMY AND MEDIAL MENISCECTOMY;  Surgeon: Ozell Bruch, MD;  Location: [facility] OR;  Service: Orthopedics;  Laterality: Right;   LEFT HEART CATH AND CORONARY ANGIOGRAPHY N/A 07/02/2020   Procedure: LEFT HEART CATH AND CORONARY ANGIOGRAPHY;  Surgeon:  Burnard Debby LABOR, MD;  Location: [facility];  Service: Cardiovascular;  Laterality: N/A;   LEFT HEART CATH AND CORONARY ANGIOGRAPHY N/A 01/22/2022   Procedure: LEFT HEART CATH AND CORONARY ANGIOGRAPHY;  Surgeon: Darron Deatrice LABOR, MD;  Location: [facility];  Service: Cardiovascular;  Laterality: N/A;   ORIF TIBIA PLATEAU Right 12/19/2014   Procedure: OPEN REDUCTION INTERNAL FIXATION (ORIF) RIGHT TIBIAL PLATEAU;  Surgeon: Ozell Bruch, MD;  Location: [facility] OR;  Service: Orthopedics;  Laterality: Right;   TONSILLECTOMY     VASECTOMY         Home Medications    Prior to Admission medications   Medication Sig Start Date End Date Taking? Authorizing Provider  amoxicillin  (AMOXIL ) 875 MG tablet Take 1 tablet (875 mg total) by mouth 2 (two) times daily for 7 days. 04/19/24 04/26/24 Yes Yacine Garriga A, FNP  benzonatate  (TESSALON ) 100 MG capsule Take 2 capsules (200 mg total) by mouth every 8 (eight) hours for 5 days. 04/19/24 04/24/24 Yes Kamoria Lucien A, FNP  aspirin  EC 81 MG tablet Take 81 mg by mouth daily.    [provider]  atorvastatin  (LIPITOR) 80 MG tablet Take 1 tablet (80 mg total) by mouth daily. 10/15/23   CoxAbigail, MD  cholecalciferol (VITAMIN D3) 25 MCG (1000 UNIT) tablet Take 1 tablet (1,000 Units total) by mouth daily. 11/14/22   CoxAbigail, MD  ciclopirox  (PENLAC ) 8 % solution Apply topically at bedtime. Apply over nail and surrounding skin. Apply daily over previous coat. After seven (7) days, may remove with alcohol and continue cycle. 09/16/23   Lamount Ethan CROME, DPM  Continuous Glucose Receiver (DEXCOM G6 RECEIVER) DEVI 1 each by Does not apply route daily. 11/11/23   Sherre Abigail, MD  Continuous Glucose Sensor (DEXCOM G7 SENSOR) MISC Every 10 days 11/21/23   Cox, Abigail, MD  dapagliflozin  propanediol (FARXIGA ) 5 MG TABS tablet Take 1 tablet (5 mg total) by mouth daily. 02/01/24   Cox, Abigail, MD  fenofibrate  (TRICOR ) 145 MG tablet TAKE 1 TABLET BY MOUTH AT BEDTIME  03/23/24   Cox, Kirsten, MD  fluticasone  (FLONASE ) 50 MCG/ACT nasal spray Place 2 sprays into both nostrils daily. 01/26/24   Cox, Abigail, MD  Glucagon  (GVOKE HYPOPEN  1-PACK) 1 MG/0.2ML SOAJ INJECT 1 DOSE SUBCUTANEOUSLY AS NEEDED 04/17/24   Cox, Kirsten, MD  Insulin  Disposable Pump (OMNIPOD 5 G7 INTRO, GEN 5,) KIT 1 kit by Does not apply route as directed. 10/22/23   Cox, Abigail, MD  Insulin  Disposable Pump (OMNIPOD 5 G7 PODS, GEN 5,) MISC Inject 1 each into the skin as directed. Every 2 days 10/22/23   Cox, Abigail, MD  Insulin  Disposable Pump (OMNIPOD DASH PODS, GEN 4,) MISC 1 each by Does not apply route every 3 (three) days. 09/16/23   CoxAbigail, MD  insulin  lispro (HUMALOG ) 100 UNIT/ML injection ON OMNIPOD.  200 UNITS PLUS EVERY 2  DAYS 04/05/24   CoxAbigail, MD  lisinopril  (ZESTRIL ) 2.5 MG tablet Take 1 tablet by mouth once daily 04/01/24   Cox, Abigail, MD  metFORMIN  (GLUCOPHAGE ) 1000 MG tablet Take 1 tablet by mouth twice daily 03/14/24   Cox, Abigail, MD  methocarbamol  (ROBAXIN ) 750 MG tablet Take 1 tablet (750 mg total) by mouth every 6 (six) hours as needed (TMJ). 10/15/23   Cox, Abigail, MD  metoprolol  succinate (TOPROL -XL) 50 MG 24 hr tablet Take 1 tablet (50 mg total) by mouth daily. Take with or immediately following a meal. 10/15/23 02/16/24  Cox, Abigail, MD  mupirocin  ointment (BACTROBAN ) 2 % Apply topically 2 (two) times daily. 10/15/23   Cox, Abigail, MD  nitroGLYCERIN  (NITROSTAT ) 0.4 MG SL tablet Place 1 tablet (0.4 mg total) under the tongue every 5 (five) minutes as needed for chest pain. 10/15/23   Sherre Abigail, MD  omeprazole  (PRILOSEC) 40 MG capsule Take 1 capsule by mouth once daily 02/28/24   Cox, Abigail, MD  ondansetron  (ZOFRAN ) 4 MG tablet Take 1 tablet (4 mg total) by mouth every 8 (eight) hours as needed for nausea or vomiting. 10/15/23   Sherre Abigail, MD  [facility] VERIO test strip 1 each by Other route daily. 10/15/23   Cox, Abigail, MD  pregabalin  (LYRICA ) 150 MG capsule  Take 1 capsule (150 mg total) by mouth 2 (two) times daily. 11/05/23   CoxAbigail, MD  tamsulosin  (FLOMAX ) 0.4 MG CAPS capsule Take 1 capsule (0.4 mg total) by mouth daily after supper. 01/26/24   CoxAbigail, MD  vitamin C  (VITAMIN C ) 500 MG tablet Take 1 tablet (500 mg total) by mouth 2 (two) times daily. 12/20/14   Deward Eck, PA-C  zolpidem  (AMBIEN ) 10 MG tablet Take 1 tablet (10 mg total) by mouth at bedtime as needed for sleep. 10/15/23 04/12/24  Sherre Abigail, MD    Family History Family History  Problem Relation Age of Onset   Diabetes Mother    CAD Mother    Liver disease Father     Social History Social History   Tobacco Use   Smoking status: Former    Current packs/day: 0.00    Types: Cigarettes    Quit date: 02/20/2007    Years since quitting: 17.1   Smokeless tobacco: Never  Vaping Use   Vaping status: Never Used  Substance Use Topics   Alcohol use: No    Alcohol/week: 0.0 standard drinks of alcohol    Comment: None in years   Drug use: No     Allergies   Ozempic  (0.25 or 0.5 mg-dose) [semaglutide (0.25 or 0.5mg -dos)]   Review of Systems Review of Systems  Respiratory:  Positive for cough.      Physical Exam Triage Vital Signs ED Triage Vitals  Encounter Vitals Group     BP 04/19/24 0842 (!) 147/73     Girls Systolic BP Percentile --      Girls Diastolic BP Percentile --      Boys Systolic BP Percentile --      Boys Diastolic BP Percentile --      Pulse Rate 04/19/24 0842 74     Resp 04/19/24 0842 20     Temp 04/19/24 0842 98.2 F (36.8 C)     Temp Source 04/19/24 0842 Oral     SpO2 04/19/24 0842 95 %     Weight --      Height --      Head Circumference --      Peak  Flow --      Pain Score 04/19/24 0839 7     Pain Loc --      Pain Education --      Exclude from Growth Chart --    No data found.  Updated Vital Signs BP (!) 147/73 (BP Location: Left Arm)   Pulse 74   Temp 98.2 F (36.8 C) (Oral)   Resp 20   SpO2 95%   Visual  Acuity Right Eye Distance:   Left Eye Distance:   Bilateral Distance:    Right Eye Near:   Left Eye Near:    Bilateral Near:     Physical Exam Constitutional:      General: He is not in acute distress.    Appearance: Normal appearance. He is not ill-appearing, toxic-appearing or diaphoretic.  HENT:     Right Ear: Tympanic membrane, ear canal and external ear normal.     Left Ear: Tympanic membrane, ear canal and external ear normal.     Nose: Congestion present.     Mouth/Throat:     Pharynx: Oropharynx is clear.  Eyes:     Conjunctiva/sclera: Conjunctivae normal.  Cardiovascular:     Rate and Rhythm: Normal rate and regular rhythm.     Pulses: Normal pulses.     Heart sounds: Normal heart sounds.  Pulmonary:     Effort: Pulmonary effort is normal.     Breath sounds: Normal breath sounds.  Musculoskeletal:        General: Normal range of motion.  Skin:    General: Skin is warm and dry.  Neurological:     Mental Status: He is alert.  Psychiatric:        Mood and Affect: Mood normal.      UC Treatments / Results  Labs (all labs ordered are listed, but only abnormal results are displayed) Labs Reviewed - No data to display  EKG   Radiology No results found.  Procedures Procedures (including critical care time)  Medications Ordered in UC Medications  dexamethasone  (DECADRON ) injection 10 mg (has no administration in time range)    Initial Impression / Assessment and Plan / UC Course  I have reviewed the triage vital signs and the nursing notes.  Pertinent labs & imaging results that were available during my care of the patient were reviewed by me and considered in my medical decision making (see chart for details).     Sinusitis with acute cough-treating for bacterial sinus infection with amoxicillin .  Tessalon  Perles for cough as needed.  Steroid injection given here today for sinus inflammation. Can continue Mucinex over-the-counter as needed.   Follow-up as needed Final Clinical Impressions(s) / UC Diagnoses   Final diagnoses:  Acute non-recurrent maxillary sinusitis  Acute cough     Discharge Instructions      We are treating for bacterial sinus infection with amoxicillin .  Tessalon  Perles for cough as needed.  Steroid injection given here today for sinus inflammation. Can continue Mucinex over-the-counter as needed.  Follow-up as needed     ED Prescriptions     Medication Sig Dispense Auth. Provider   amoxicillin  (AMOXIL ) 875 MG tablet Take 1 tablet (875 mg total) by mouth 2 (two) times daily for 7 days. 14 tablet Mack Alvidrez A, FNP   benzonatate  (TESSALON ) 100 MG capsule Take 2 capsules (200 mg total) by mouth every 8 (eight) hours for 5 days. 30 capsule Adah Wilbert LABOR, FNP      PDMP not reviewed this encounter.  Adah Wilbert LABOR, FNP 04/19/24 [phone]

## 2024-05-03 ENCOUNTER — Ambulatory Visit (INDEPENDENT_AMBULATORY_CARE_PROVIDER_SITE_OTHER)
Admission: RE | Admit: 2024-05-03 | Discharge: 2024-05-03 | Disposition: A | Discharge status: Home / Self Care | Source: Ambulatory Visit | Attending: Family Medicine | Admitting: Family Medicine

## 2024-05-03 ENCOUNTER — Ambulatory Visit: Admitting: Family Medicine

## 2024-05-03 VITALS — BP 130/72 | HR 73 | Temp 97.8°F | Ht 65.0 in | Wt 200.0 lb

## 2024-05-03 DIAGNOSIS — M791 Myalgia, unspecified site: Secondary | ICD-10-CM | POA: Diagnosis not present

## 2024-05-03 DIAGNOSIS — N401 Enlarged prostate with lower urinary tract symptoms: Secondary | ICD-10-CM | POA: Diagnosis not present

## 2024-05-03 DIAGNOSIS — M545 Low back pain, unspecified: Secondary | ICD-10-CM

## 2024-05-03 DIAGNOSIS — E782 Mixed hyperlipidemia: Secondary | ICD-10-CM

## 2024-05-03 DIAGNOSIS — E1142 Type 2 diabetes mellitus with diabetic polyneuropathy: Secondary | ICD-10-CM

## 2024-05-03 DIAGNOSIS — R053 Chronic cough: Secondary | ICD-10-CM | POA: Diagnosis not present

## 2024-05-03 LAB — POCT GLYCOSYLATED HEMOGLOBIN (HGB A1C): HbA1c POC (<> result, manual entry): 7.7 % (ref 4.0–5.6)

## 2024-05-03 LAB — POCT LIPID PANEL
HDL: 35
LDL: 80
Non-HDL: 102
TC: 137
TRG: 113

## 2024-05-03 MED ORDER — METHOCARBAMOL 750 MG PO TABS: 750.0000 mg | ORAL_TABLET | Freq: Four times a day (QID) | ORAL | 5 refills | Status: AC | PRN

## 2024-05-03 MED ORDER — BENZONATATE 200 MG PO CAPS: 200.0000 mg | ORAL_CAPSULE | Freq: Two times a day (BID) | ORAL | 0 refills | Status: AC | PRN

## 2024-05-03 NOTE — Patient Instructions (Signed)
°  VISIT SUMMARY: During your visit, we addressed your persistent cough, diabetes management, back pain, and urinary symptoms. We also discussed your recent finger wound and muscle cramps.  YOUR PLAN: CHRONIC COUGH: You have a persistent dry cough that worsens at night and has been ongoing for about a month. -We have prescribed Tessalon  Perles to help manage your cough. -You should also continue using Mucinex along with Tessalon  Perles.  TYPE 2 DIABETES MELLITUS WITH DIABETIC POLYNEUROPATHY: Your blood sugar levels have been fluctuating, with some recent episodes of low blood sugar. -Continue using Freestyle Libre for glucose monitoring. -We will retry Farxiga  for your diabetes management. -We will check your magnesium  and potassium levels. -Adjust your insulin  dosing based on your glucose readings and meal intake.  MIXED HYPERLIPIDEMIA: Your cholesterol levels are well-controlled with your current treatment. -Continue your current cholesterol management regimen.  LOW BACK PAIN: You have intermittent back pain that may be related to coughing or movements. -We have ordered an x-ray of your back. -We have prescribed methocarbamol  for muscle relaxation.  BENIGN PROSTATIC HYPERPLASIA WITH LOWER URINARY TRACT SYMPTOMS: You have frequent urination and previously stopped taking tamsulosin  due to muscle cramps. -We will retry Farxiga  for your urinary symptoms. -Monitor for any recurrence of muscle cramps.  FINGER WOUND: Your finger wound has reopened multiple times and you have been keeping it wrapped. -Continue applying Neosporin to the wound and keep it wrapped to prevent further opening.                      Contains text generated by [employer]bridge.                                 Contains text generated by Abridge.

## 2024-05-03 NOTE — Assessment & Plan Note (Signed)
  Orders:   POCT Lipid Panel

## 2024-05-03 NOTE — Progress Notes (Unsigned)
 Subjective:  Patient ID: Mitchell Hammond, male    DOB: 02-Jul-1955  Age: 68 y.o. MRN: [id number]  Chief Complaint  Patient presents with   Medical Management of Chronic Issues    States he stopped farxiga  and flomax  caused leg cramps    HPI: Discussed the use of AI scribe software for clinical note transcription with the patient, who gave verbal consent to proceed.  History of Present Illness Mitchell Hammond is a 68 year old male with diabetes who presents with a persistent cough.  Cough and respiratory symptoms - Persistent dry cough for approximately one month, following a preceding severe cold. - Cough is worse at night, leading to gagging and vomiting. - Initial treatment with antibiotics and Tessalon  Perles provided partial relief; Tessalon  Perles supply has been exhausted. - Mucinex provides only temporary relief. - No fever, chills, chest pain, or significant sputum production. - Shortness of breath occurs during coughing episodes.  Glycemic control and diabetes management - Diabetes with recent fluctuations in blood glucose levels. - Episodes of hypoglycemia with blood glucose as low as the 40s, attributed to skipping meals. - Hypoglycemia managed with sugary drinks such as Coke. - Uses Omnipod for insulin  delivery and adjusts insulin  dose based on food intake. - Recent weight gain due to decreased activity and increased food intake during recovery from illness.  Finger wound - History of finger injury from a saw, requiring stitches. - Wound has reopened multiple times; kept wrapped to prevent further opening. - Applies Neosporin to the wound.  Lower extremity muscle cramps and urinary symptoms - Muscle cramps in the back of the legs previously associated with prostate medication; cramps resolved after discontinuation of medication. - Continued frequent urination, including nocturia with multiple awakenings per night.  Back pain - History of back surgery in 2014 for  discitis, involving bone shaving. - Occasional back pain, managed with methocarbamol  (not used recently). - Coughing exacerbates back pain.       01/26/2024    7:41 AM 10/15/2023    7:36 AM 07/14/2023    8:31 AM 03/31/2023    9:22 AM 11/10/2022   10:10 AM  Depression screen PHQ 2/9  Decreased Interest 0 0 0 0 0  Down, Depressed, Hopeless 0 0 0 0 0  PHQ - 2 Score 0 0 0 0 0  Altered sleeping 3 2 3 2  0  Tired, decreased energy 0 1 0 1 0  Change in appetite 2 0 3 0 0  Feeling bad or failure about yourself  0 0 0 0 0  Trouble concentrating 0 0 0 0 0  Moving slowly or fidgety/restless 0 0 0 0 0  Suicidal thoughts 0 0 0 0 0  PHQ-9 Score 5  3  6  3   0   Difficult doing work/chores Not difficult at all Not difficult at all Not difficult at all Not difficult at all Not difficult at all     Data saved with a previous flowsheet row definition        01/26/2024    7:41 AM  Fall Risk   Falls in the past year? 0  Number falls in past yr: 0  Injury with Fall? 0   Risk for fall due to : No Fall Risks  Follow up Falls evaluation completed     Data saved with a previous flowsheet row definition    Patient Care Team: Sherre Clapper, MD as PCP - General (Family Medicine) Revankar, Jennifer SAUNDERS, MD as PCP - Cardiology (  Cardiology) Nyle Rankin POUR, [facility] (Inactive) (Pharmacist)   Review of Systems  Constitutional:  Negative for chills, fatigue and fever.  HENT:  Negative for congestion, ear pain and sore throat.   Respiratory:  Positive for cough. Negative for shortness of breath.   Cardiovascular:  Negative for chest pain.  Gastrointestinal:  Negative for abdominal pain, constipation, diarrhea, nausea and vomiting.  Endocrine: Negative for polydipsia, polyphagia and polyuria.  Genitourinary:  Negative for dysuria and frequency.  Musculoskeletal:  Negative for arthralgias and myalgias.  Neurological:  Negative for dizziness and headaches.  Psychiatric/Behavioral:  Negative for dysphoric mood.         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 07/05/2019   Diabetes mellitus due to underlying condition with unspecified complications (HCC) 12/26/2015   Diabetic polyneuropathy associated with type 2 diabetes mellitus (HCC) 08/20/2016   Discitis    Essential hypertension 12/26/2015   Fracture, tibial plateau 12/18/2014   Hypertension    IDDM (insulin  dependent diabetes mellitus)    type 2   Metatarsalgia of both feet 08/20/2016   MI (myocardial infarction) (HCC)    x 2, Multi-link Vision 3.5 x 15 mm BMS in 2009 (mid LAD), balloon 2015   Mixed hyperlipidemia 06/14/2020   Obesity (BMI 30.0-34.9) 06/26/2020   Tibial plateau fracture 12/18/2014   Vitamin D  insufficiency    Past Surgical History:  Procedure Laterality Date   BACK SURGERY     2014   CARDIAC CATHETERIZATION     x 2, 2009 & 2015   COLONOSCOPY     CORONARY ANGIOPLASTY     stent - 2008, balloon - 2015   FASCIOTOMY Right 12/19/2014   Procedure: ANTERIOR COMPARTMENTAL FASCIOTOMY;  Surgeon: Ozell Bruch, MD;  Location[facility] OR;  Service: Orthopedics;  Laterality: Right;   HAND SURGERY Left    HARDWARE REMOVAL Right 07/24/2015   Procedure: HARDWARE REMOVAL RIGHT TIBIA;  Surgeon: Ozell Bruch, MD;  Location: [facility] OR;  Service: Orthopedics;  Laterality: Right;   KNEE ARTHROSCOPY Right 07/24/2015   Procedure: RIGHT ARTHROSCOPY KNEE WITH PARTIAL SYNOVECTOMY AND MEDIAL MENISCECTOMY;  Surgeon: Ozell Bruch, MD;  Location: [facility] OR;  Service: Orthopedics;  Laterality: Right;   LEFT HEART CATH AND CORONARY ANGIOGRAPHY N/A 07/02/2020   Procedure: LEFT HEART CATH AND CORONARY ANGIOGRAPHY;  Surgeon: Burnard Debby LABOR, MD;  Location: [facility] INVASIVE CV LAB;   Service: Cardiovascular;  Laterality: N/A;   LEFT HEART CATH AND CORONARY ANGIOGRAPHY N/A 01/22/2022   Procedure: LEFT HEART CATH AND CORONARY ANGIOGRAPHY;  Surgeon: Darron Deatrice LABOR, MD;  Location: [facility] INVASIVE CV LAB;  Service: Cardiovascular;  Laterality: N/A;   ORIF TIBIA PLATEAU Right 12/19/2014   Procedure: OPEN REDUCTION INTERNAL FIXATION (ORIF) RIGHT TIBIAL PLATEAU;  Surgeon: Ozell Bruch, MD;  Location: [facility] OR;  Service: Orthopedics;  Laterality: Right;   TONSILLECTOMY     VASECTOMY      Family History  Problem Relation Age of Onset   Diabetes Mother    CAD Mother    Liver disease Father    Social History   Socioeconomic History   Marital status: Married    Spouse name: Not on file   Number of  children: 2   Years of education: Not on file   Highest education level: Not on file  Occupational History   Occupation: disabled  Tobacco Use   Smoking status: Former    Current packs/day: 0.00    Average packs/day: 1.0 packs/day    Types: Cigarettes    Quit date: 02/20/2007    Years since quitting: 17.2   Smokeless tobacco: Never  Vaping Use   Vaping status: Never Used  Substance and Sexual Activity   Alcohol use: No    Alcohol/week: 0.0 standard drinks of alcohol    Comment: None in years   Drug use: No   Sexual activity: Yes    Partners: Female  Other Topics Concern   Not on file  Social History Narrative   Pt lives with wife and daughter in [place].    Social Drivers of Health   Tobacco Use: Medium Risk (04/19/2024)   Patient History    Smoking Tobacco Use: Former    Smokeless Tobacco Use: Never    Passive Exposure: Not on file  Financial Resource Strain: Low Risk (11/14/2021)   Overall Financial Resource Strain (CARDIA)    Difficulty of Paying Living Expenses: Not hard at all  Food Insecurity: No Food Insecurity (05/03/2024)   Epic    Worried About Programme Researcher, Broadcasting/film/video in the Last Year: Never true    Ran Out of Food in the Last Year: Never true   Transportation Needs: No Transportation Needs (05/03/2024)   Epic    Lack of Transportation (Medical): No    Lack of Transportation (Non-Medical): No  Physical Activity: Inactive (12/17/2022)   Exercise Vital Sign    Days of Exercise per Week: 0 days    Minutes of Exercise per Session: 0 min  Stress: No Stress Concern Present (06/24/2022)   [employer] of Occupational Health - Occupational Stress Questionnaire    Feeling of Stress : Not at all  Social Connections: Moderately Integrated (06/24/2022)   Social Connection and Isolation Panel    Frequency of Communication with Friends and Family: More than three times a week    Frequency of Social Gatherings with Friends and Family: More than three times a week    Attends Religious Services: More than 4 times per year    Active Member of Clubs or Organizations: No    Attends Banker Meetings: Never    Marital Status: Married  Depression (PHQ2-9): Medium Risk (01/26/2024)   Depression (PHQ2-9)    PHQ-2 Score: 5  Alcohol Screen: Low Risk (06/24/2022)   Alcohol Screen    Last Alcohol Screening Score (AUDIT): 0  Housing: Low Risk (05/03/2024)   Epic    Unable to Pay for Housing in the Last Year: No    Number of Times Moved in the Last Year: 0    Homeless in the Last Year: No  Utilities: Not At Risk (05/03/2024)   Epic    Threatened with loss of utilities: No  Health Literacy: Adequate Health Literacy (12/17/2022)   B1300 Health Literacy    Frequency of need for help with medical instructions: Never    Objective:  BP 130/72   Pulse 73   Temp 97.8 F (36.6 C)   Ht 5' 5 (1.651 m)   Wt 200 lb (90.7 kg)   SpO2 93%   BMI 33.28 kg/m      05/03/2024    7:39 AM 04/19/2024    8:42 AM 01/26/2024    7:41 AM  BP/Weight  Systolic BP 130 147 132  Diastolic BP 72 73 66  Wt. (Lbs) 200  196  BMI 33.28 kg/m2  32.62 kg/m2    Physical Exam Vitals reviewed.  Constitutional:      Appearance: Normal appearance.  HENT:      Right Ear: Tympanic membrane, ear canal and external ear normal.     Left Ear: Tympanic membrane, ear canal and external ear normal.     Nose: Congestion (nose erythema) present. No rhinorrhea.     Mouth/Throat:     Mouth: Mucous membranes are moist.     Pharynx: No oropharyngeal exudate or posterior oropharyngeal erythema.  Neck:     Vascular: No carotid bruit.  Cardiovascular:     Rate and Rhythm: Normal rate and regular rhythm.     Pulses: Normal pulses.     Heart sounds: Normal heart sounds.  Pulmonary:     Effort: Pulmonary effort is normal. No respiratory distress.     Breath sounds: Normal breath sounds. No wheezing, rhonchi or rales.  Abdominal:     General: Bowel sounds are normal.     Palpations: Abdomen is soft.     Tenderness: There is no abdominal tenderness.  Skin:    Findings: Lesion (2nd left digit -laceration. healing. separates a little due to being over PIP.) present.  Neurological:     Mental Status: He is alert.  Psychiatric:        Mood and Affect: Mood normal.        Behavior: Behavior normal.     {Perform Simple Foot Exam  Perform Detailed exam:1} Diabetic foot exam was performed with the following findings:   No deformities, ulcerations, or other skin breakdown Normal sensation of 10g monofilament Intact posterior tibialis and dorsalis pedis pulses      Lab Results  Component Value Date   WBC 5.6 10/15/2023   HGB 14.1 10/15/2023   HCT 43.1 10/15/2023   PLT 176 10/15/2023   GLUCOSE 135 (H) 10/15/2023   CHOL 110 10/15/2023   TRIG 76 10/15/2023   HDL 31 (L) 10/15/2023   LDLCALC 63 10/15/2023   ALT 26 10/15/2023   AST 24 10/15/2023   NA 142 10/15/2023   K 4.5 10/15/2023   CL 107 (H) 10/15/2023   CREATININE 1.06 10/15/2023   BUN 12 10/15/2023   CO2 19 (L) 10/15/2023   TSH 2.829 12/18/2014   INR 1.20 12/18/2014   HGBA1C 7.7 05/03/2024    Results for orders placed or performed in visit on 05/03/24  POCT glycosylated hemoglobin (Hb A1C)    Collection Time: 05/03/24  7:56 AM  Result Value Ref Range   Hemoglobin A1C     HbA1c POC (<> result, manual entry) 7.7 4.0 - 5.6 %   HbA1c, POC (prediabetic range)     HbA1c, POC (controlled diabetic range)    POCT Lipid Panel   Collection Time: 05/03/24  7:56 AM  Result Value Ref Range   TC 137    HDL 35    TRG 113    LDL 80    Non-HDL 102    TC/HDL    .  Assessment & Plan:   Assessment & Plan Diabetic polyneuropathy associated with type 2 diabetes mellitus (HCC) Blood sugar fluctuations with recent hypoglycemic episodes. A1c improved to 7.7. Muscle cramps possibly medication-related. Freestyle Libre used for monitoring, Dexcom not covered by insurance. - Continue using Freestyle Libre for glucose monitoring. - Retry Farxiga  for diabetes management. - Check magnesium  and potassium levels. -  Adjust insulin  dosing based on glucose readings and meal intake. Orders:   POCT glycosylated hemoglobin (Hb A1C)   CBC with Differential/Platelet  Mixed hyperlipidemia Cholesterol levels well-controlled with current regimen. - Continue current cholesterol management regimen. Orders:   POCT Lipid Panel   TSH   methocarbamol  (ROBAXIN ) 750 MG tablet; Take 1 tablet (750 mg total) by mouth every 6 (six) hours as needed (TMJ).  Myalgia Check labs Orders:   CBC with Differential/Platelet   Comprehensive metabolic panel with GFR   Magnesium    Phosphorus  Lumbar pain Intermittent pain possibly related to coughing or movements. Previous surgery in 2014, no recent imaging. - Ordered x-ray of the back. - Prescribed methocarbamol  for muscle relaxation. Orders:   DG Lumbar Spine Complete; Future  Chronic cough Persisting dry cough with nocturnal exacerbations, partial relief from Tessalon  Perles and antibiotics. No further antibiotics needed. - Prescribed Tessalon  Perles for cough management. - Recommended use of Mucinex with Tessalon  Perles.    BPH with lower urinary tract  symptoms without urinary obstruction Frequent urination, previous tamsulosin  discontinued due to cramps. Farxiga  to be retried. - Retry Farxiga  for urinary symptoms. - Monitor for recurrence of muscle cramps.     Body mass index is 33.28 kg/m.   No orders of the defined types were placed in this encounter.   Orders Placed This Encounter  Procedures   POCT glycosylated hemoglobin (Hb A1C)   POCT Lipid Panel     I,[name] I Leal-Borjas,acting as a scribe for Abigail Free, MD.,have documented all relevant documentation on the behalf of Abigail Free, MD,as directed by  Abigail Free, MD while in the presence of Abigail Free, MD.   Follow-up: No follow-ups on file.  An After Visit Summary was printed and given to the patient.  Abigail Free, MD [facility] [phone]     [1]  Current Outpatient Medications on File Prior to Visit  Medication Sig Dispense Refill   aspirin  EC 81 MG tablet Take 81 mg by mouth daily.     atorvastatin  (LIPITOR) 80 MG tablet Take 1 tablet (80 mg total) by mouth daily. 90 tablet 3   cholecalciferol (VITAMIN D3) 25 MCG (1000 UNIT) tablet Take 1 tablet (1,000 Units total) by mouth daily. 30 tablet 1   ciclopirox  (PENLAC ) 8 % solution Apply topically at bedtime. Apply over nail and surrounding skin. Apply daily over previous coat. After seven (7) days, may remove with alcohol and continue cycle. 6.6 mL 0   Continuous Glucose Receiver (DEXCOM G6 RECEIVER) DEVI 1 each by Does not apply route daily. 1 each 0   Continuous Glucose Sensor (DEXCOM G7 SENSOR) MISC Every 10 days 9 each 3   fenofibrate  (TRICOR ) 145 MG tablet TAKE 1 TABLET BY MOUTH AT BEDTIME 90 tablet 0   fluticasone  (FLONASE ) 50 MCG/ACT nasal spray Place 2 sprays into both nostrils daily. 16 g 6   Glucagon  (GVOKE HYPOPEN  1-PACK) 1 MG/0.2ML SOAJ INJECT 1 DOSE SUBCUTANEOUSLY AS NEEDED 1 mL 3   Insulin  Disposable Pump (OMNIPOD 5 G7 INTRO, GEN 5,) KIT 1 kit by Does not apply route as directed. 1 kit  0   Insulin  Disposable Pump (OMNIPOD 5 G7 PODS, GEN 5,) MISC Inject 1 each into the skin as directed. Every 2 days 15 each 5   Insulin  Disposable Pump (OMNIPOD DASH PODS, GEN 4,) MISC 1 each by Does not apply route every 3 (three) days. 10 each 0   insulin  lispro (HUMALOG ) 100 UNIT/ML injection ON OMNIPOD.  200  UNITS PLUS EVERY 2 DAYS 20 mL 0   lisinopril  (ZESTRIL ) 2.5 MG tablet Take 1 tablet by mouth once daily 90 tablet 0   metFORMIN  (GLUCOPHAGE ) 1000 MG tablet Take 1 tablet by mouth twice daily 180 tablet 0   methocarbamol  (ROBAXIN ) 750 MG tablet Take 1 tablet (750 mg total) by mouth every 6 (six) hours as needed (TMJ). 120 tablet 2   metoprolol  succinate (TOPROL -XL) 50 MG 24 hr tablet Take 1 tablet (50 mg total) by mouth daily. Take with or immediately following a meal. 90 tablet 3   mupirocin  ointment (BACTROBAN ) 2 % Apply topically 2 (two) times daily. 22 g 6   nitroGLYCERIN  (NITROSTAT ) 0.4 MG SL tablet Place 1 tablet (0.4 mg total) under the tongue every 5 (five) minutes as needed for chest pain. 25 tablet 8   omeprazole  (PRILOSEC) 40 MG capsule Take 1 capsule by mouth once daily 90 capsule 1   ondansetron  (ZOFRAN ) 4 MG tablet Take 1 tablet (4 mg total) by mouth every 8 (eight) hours as needed for nausea or vomiting. 20 tablet 0   ONETOUCH VERIO test strip 1 each by Other route daily. 100 each 3   pregabalin  (LYRICA ) 150 MG capsule Take 1 capsule (150 mg total) by mouth 2 (two) times daily. 60 capsule 3   vitamin C  (VITAMIN C ) 500 MG tablet Take 1 tablet (500 mg total) by mouth 2 (two) times daily. 60 tablet 2   zolpidem  (AMBIEN ) 10 MG tablet Take 1 tablet (10 mg total) by mouth at bedtime as needed for sleep. 30 tablet 5   No current facility-administered medications on file prior to visit.

## 2024-05-03 NOTE — Assessment & Plan Note (Signed)
 Orders:    POCT glycosylated hemoglobin (Hb A1C)

## 2024-05-04 ENCOUNTER — Ambulatory Visit: Payer: Self-pay | Admitting: Family Medicine

## 2024-05-04 LAB — CBC WITH DIFFERENTIAL/PLATELET
Basophils Absolute: 0 x10E3/uL (ref 0.0–0.2)
Basos: 1 %
EOS (ABSOLUTE): 0.2 x10E3/uL (ref 0.0–0.4)
Eos: 3 %
Hematocrit: 42.2 % (ref 37.5–51.0)
Hemoglobin: 13.6 g/dL (ref 13.0–17.7)
Immature Grans (Abs): 0 x10E3/uL (ref 0.0–0.1)
Immature Granulocytes: 0 %
Lymphocytes Absolute: 1.1 x10E3/uL (ref 0.7–3.1)
Lymphs: 17 %
MCH: 28.1 pg (ref 26.6–33.0)
MCHC: 32.2 g/dL (ref 31.5–35.7)
MCV: 87 fL (ref 79–97)
Monocytes Absolute: 0.6 x10E3/uL (ref 0.1–0.9)
Monocytes: 9 %
Neutrophils Absolute: 4.4 x10E3/uL (ref 1.4–7.0)
Neutrophils: 70 %
Platelets: 181 x10E3/uL (ref 150–450)
RBC: 4.84 x10E6/uL (ref 4.14–5.80)
RDW: 13.1 % (ref 11.6–15.4)
WBC: 6.2 x10E3/uL (ref 3.4–10.8)

## 2024-05-04 LAB — MAGNESIUM: Magnesium: 2 mg/dL (ref 1.6–2.3)

## 2024-05-04 LAB — TSH: TSH: 3.71 u[IU]/mL (ref 0.450–4.500)

## 2024-05-04 LAB — COMPREHENSIVE METABOLIC PANEL WITH GFR
ALT: 22 IU/L (ref 0–44)
AST: 24 IU/L (ref 0–40)
Albumin: 4.3 g/dL (ref 3.9–4.9)
Alkaline Phosphatase: 63 IU/L (ref 47–123)
BUN/Creatinine Ratio: 15 (ref 10–24)
BUN: 15 mg/dL (ref 8–27)
Bilirubin Total: 0.7 mg/dL (ref 0.0–1.2)
CO2: 23 mmol/L (ref 20–29)
Calcium: 9.7 mg/dL (ref 8.6–10.2)
Chloride: 105 mmol/L (ref 96–106)
Creatinine, Ser: 1.02 mg/dL (ref 0.76–1.27)
Globulin, Total: 2 g/dL (ref 1.5–4.5)
Glucose: 129 mg/dL — ABNORMAL HIGH (ref 70–99)
Potassium: 4.8 mmol/L (ref 3.5–5.2)
Sodium: 141 mmol/L (ref 134–144)
Total Protein: 6.3 g/dL (ref 6.0–8.5)
eGFR: 79 mL/min/1.73 (ref >59)

## 2024-05-04 LAB — PHOSPHORUS: Phosphorus: 2.8 mg/dL (ref 2.8–4.1)

## 2024-05-07 DIAGNOSIS — M791 Myalgia, unspecified site: Secondary | ICD-10-CM | POA: Insufficient documentation

## 2024-05-07 DIAGNOSIS — M545 Low back pain, unspecified: Secondary | ICD-10-CM | POA: Insufficient documentation

## 2024-05-07 DIAGNOSIS — R053 Chronic cough: Secondary | ICD-10-CM | POA: Insufficient documentation

## 2024-05-07 NOTE — Assessment & Plan Note (Signed)
 Check labs Orders:   CBC with Differential/Platelet   Comprehensive metabolic panel with GFR   Magnesium    Phosphorus

## 2024-05-07 NOTE — Assessment & Plan Note (Signed)
 Persisting dry cough with nocturnal exacerbations, partial relief from Tessalon  Perles and antibiotics. No further antibiotics needed. - Prescribed Tessalon  Perles for cough management. - Recommended use of Mucinex with Tessalon  Perles.

## 2024-05-07 NOTE — Assessment & Plan Note (Signed)
 Intermittent pain possibly related to coughing or movements. Previous surgery in 2014, no recent imaging. - Ordered x-ray of the back. - Prescribed methocarbamol  for muscle relaxation. Orders:   DG Lumbar Spine Complete; Future

## 2024-05-07 NOTE — Assessment & Plan Note (Signed)
 Frequent urination, previous tamsulosin  discontinued due to cramps. Farxiga  to be retried. - Retry Farxiga  for urinary symptoms. - Monitor for recurrence of muscle cramps.

## 2024-05-08 ENCOUNTER — Encounter: Payer: Self-pay | Admitting: Family Medicine

## 2024-05-09 ENCOUNTER — Other Ambulatory Visit: Payer: Self-pay | Admitting: Family Medicine

## 2024-05-09 DIAGNOSIS — E1142 Type 2 diabetes mellitus with diabetic polyneuropathy: Secondary | ICD-10-CM

## 2024-05-17 ENCOUNTER — Encounter (HOSPITAL_BASED_OUTPATIENT_CLINIC_OR_DEPARTMENT_OTHER): Payer: Self-pay

## 2024-05-17 ENCOUNTER — Ambulatory Visit (HOSPITAL_BASED_OUTPATIENT_CLINIC_OR_DEPARTMENT_OTHER): Admit: 2024-05-17 | Discharge: 2024-05-17 | Disposition: A | Discharge status: Home / Self Care | Admitting: Radiology

## 2024-05-17 ENCOUNTER — Ambulatory Visit (HOSPITAL_BASED_OUTPATIENT_CLINIC_OR_DEPARTMENT_OTHER)
Admission: EM | Admit: 2024-05-17 | Discharge: 2024-05-17 | Disposition: A | Discharge status: Home / Self Care | Attending: Family Medicine | Admitting: Family Medicine

## 2024-05-17 ENCOUNTER — Ambulatory Visit (HOSPITAL_BASED_OUTPATIENT_CLINIC_OR_DEPARTMENT_OTHER): Payer: Self-pay | Admitting: Family Medicine

## 2024-05-17 ENCOUNTER — Other Ambulatory Visit (HOSPITAL_BASED_OUTPATIENT_CLINIC_OR_DEPARTMENT_OTHER): Payer: Self-pay

## 2024-05-17 DIAGNOSIS — R059 Cough, unspecified: Secondary | ICD-10-CM | POA: Diagnosis not present

## 2024-05-17 DIAGNOSIS — J101 Influenza due to other identified influenza virus with other respiratory manifestations: Secondary | ICD-10-CM | POA: Diagnosis not present

## 2024-05-17 DIAGNOSIS — R051 Acute cough: Secondary | ICD-10-CM

## 2024-05-17 LAB — POC COVID19/FLU A&B COMBO
Covid Antigen, POC: NEGATIVE
Influenza A Antigen, POC: POSITIVE — AB
Influenza B Antigen, POC: NEGATIVE

## 2024-05-17 MED ORDER — OSELTAMIVIR PHOSPHATE 75 MG PO CAPS
75.0000 mg | ORAL_CAPSULE | Freq: Two times a day (BID) | ORAL | 0 refills | Status: AC
  Filled 2024-05-17: qty 10, 5d supply, fill #0

## 2024-05-17 MED ORDER — AZITHROMYCIN 250 MG PO TABS
ORAL_TABLET | ORAL | 0 refills | Status: AC
  Filled 2024-05-17: qty 6, 5d supply, fill #0

## 2024-05-17 MED ORDER — PROMETHAZINE-DM 6.25-15 MG/5ML PO SYRP
5.0000 mL | ORAL_SOLUTION | Freq: Four times a day (QID) | ORAL | 0 refills | Status: AC | PRN
  Filled 2024-05-17: qty 118, 6d supply, fill #0

## 2024-05-17 NOTE — ED Provider Notes (Signed)
 " [facility]    CSN: [id number] Arrival date & time: 05/17/24  9051      History   Chief Complaint Chief Complaint  Patient presents with   Cough    HPI Mitchell Hammond is a 68 y.o. male.   Pt states he was seen on 04/19/24 and was treated for sinus infection. Since then he has continued to have a cough, nasal congestion, HA, facial pain, and he started to run a fever over the weekend. He states he feels that he is full of fluid. He has continued to take mucinex and tylenol  with temporary relief. States his whole family is sick as well.     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 07/05/2019   Diabetes mellitus due to underlying condition with unspecified complications (HCC) 12/26/2015   Diabetic polyneuropathy associated with type 2 diabetes mellitus (HCC) 08/20/2016   Discitis    Essential hypertension 12/26/2015   Fracture, tibial plateau 12/18/2014   Hypertension    IDDM (insulin  dependent diabetes mellitus)    type 2   Metatarsalgia of both feet 08/20/2016   MI (myocardial infarction) (HCC)    x 2, Multi-link Vision 3.5 x 15 mm BMS in 2009 (mid LAD), balloon 2015   Mixed hyperlipidemia 06/14/2020   Obesity (BMI 30.0-34.9) 06/26/2020   Tibial plateau fracture 12/18/2014   Vitamin D  insufficiency     Patient Active Problem List   Diagnosis Date Noted   Myalgia 05/07/2024   Lumbar pain 05/07/2024   Chronic cough 05/07/2024   BPH with lower urinary tract symptoms without urinary obstruction 02/01/2024   Non-seasonal allergic rhinitis due to pollen 02/01/2024   Acute nasopharyngitis 02/01/2024   Peripheral edema 02/01/2024   Chronic insomnia 02/01/2024   Scab 10/17/2023   Atypical pigmented lesion 10/17/2023    Nausea 10/17/2023   Onychomycosis 04/01/2023   Dislocation of jaw 04/01/2023   Nail wound of sole of foot 04/01/2023   Episodic cluster headache, intractable 12/17/2022   Severe headache 12/17/2022   Right leg pain 11/11/2022   Primary insomnia 11/11/2022   Shortness of breath 06/28/2022   Screening for colon cancer 06/28/2022   GERD (gastroesophageal reflux disease) 02/04/2022   BMI 31.0-31.9,adult 04/24/2021   Discitis    MI (myocardial infarction) (HCC)    Mixed hyperlipidemia 06/14/2020   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 07/05/2019   Chip fracture of triquetral bone of right wrist, closed, initial encounter 12/09/2016   Contracture of tendon sheath 08/20/2016   Metatarsalgia of both feet 08/20/2016   Hypertensive heart disease 12/26/2015   Diabetic polyneuropathy (HCC) 12/26/2015   Vitamin D  insufficiency 12/20/2014   CAD (coronary artery disease)    Chronic back pain     Past Surgical History:  Procedure Laterality Date   BACK SURGERY     2014   CARDIAC CATHETERIZATION     x 2, 2009 & 2015   COLONOSCOPY     CORONARY ANGIOPLASTY     stent - 2008, balloon - 2015   FASCIOTOMY Right 12/19/2014   Procedure: ANTERIOR COMPARTMENTAL FASCIOTOMY;  Surgeon: Ozell Bruch, MD;  Location: [facility] OR;  Service: Orthopedics;  Laterality: Right;   HAND SURGERY Left  HARDWARE REMOVAL Right 07/24/2015   Procedure: HARDWARE REMOVAL RIGHT TIBIA;  Surgeon: Ozell Bruch, MD;  Location: [facility] OR;  Service: Orthopedics;  Laterality: Right;   KNEE ARTHROSCOPY Right 07/24/2015   Procedure: RIGHT ARTHROSCOPY KNEE WITH PARTIAL SYNOVECTOMY AND MEDIAL MENISCECTOMY;  Surgeon: Ozell Bruch, MD;  Location: [facility] OR;  Service: Orthopedics;  Laterality: Right;   LEFT HEART CATH AND CORONARY ANGIOGRAPHY N/A 07/02/2020   Procedure: LEFT HEART CATH AND CORONARY ANGIOGRAPHY;  Surgeon: Burnard Debby LABOR, MD;  Location: [facility];  Service: Cardiovascular;  Laterality: N/A;   LEFT HEART CATH AND  CORONARY ANGIOGRAPHY N/A 01/22/2022   Procedure: LEFT HEART CATH AND CORONARY ANGIOGRAPHY;  Surgeon: Darron Deatrice LABOR, MD;  Location: [facility];  Service: Cardiovascular;  Laterality: N/A;   ORIF TIBIA PLATEAU Right 12/19/2014   Procedure: OPEN REDUCTION INTERNAL FIXATION (ORIF) RIGHT TIBIAL PLATEAU;  Surgeon: Ozell Bruch, MD;  Location: [facility] OR;  Service: Orthopedics;  Laterality: Right;   TONSILLECTOMY     VASECTOMY         Home Medications    Prior to Admission medications  Medication Sig Start Date End Date Taking? Authorizing Provider  azithromycin  (ZITHROMAX ) 250 MG tablet Take 2 tablets (500 mg total) by mouth daily for 1 day, THEN 1 tablet (250 mg total) daily for 4 days. 05/17/24 05/22/24 Yes Abdirahman Chittum A, FNP  oseltamivir (TAMIFLU) 75 MG capsule Take 1 capsule (75 mg total) by mouth every 12 (twelve) hours. 05/17/24  Yes Bedford Winsor A, FNP  promethazine -dextromethorphan (PROMETHAZINE -DM) 6.25-15 MG/5ML syrup Take 5 mLs by mouth 4 (four) times daily as needed for cough. 05/17/24  Yes Jennice Renegar A, FNP  aspirin  EC 81 MG tablet Take 81 mg by mouth daily.    [provider]  atorvastatin  (LIPITOR) 80 MG tablet Take 1 tablet (80 mg total) by mouth daily. 10/15/23   CoxAbigail, MD  benzonatate  (TESSALON ) 200 MG capsule Take 1 capsule (200 mg total) by mouth 2 (two) times daily as needed for cough. 05/03/24   CoxAbigail, MD  cholecalciferol (VITAMIN D3) 25 MCG (1000 UNIT) tablet Take 1 tablet (1,000 Units total) by mouth daily. 11/14/22   Cox, Abigail, MD  ciclopirox  (PENLAC ) 8 % solution Apply topically at bedtime. Apply over nail and surrounding skin. Apply daily over previous coat. After seven (7) days, may remove with alcohol and continue cycle. 09/16/23   Lamount Ethan CROME, DPM  Continuous Glucose Receiver (DEXCOM G6 RECEIVER) DEVI 1 each by Does not apply route daily. 11/11/23   Sherre Abigail, MD  Continuous Glucose Sensor (DEXCOM G7 SENSOR) MISC Every 10 days 11/21/23    Cox, Abigail, MD  fenofibrate  (TRICOR ) 145 MG tablet TAKE 1 TABLET BY MOUTH AT BEDTIME 03/23/24   Cox, Kirsten, MD  fluticasone  (FLONASE ) 50 MCG/ACT nasal spray Place 2 sprays into both nostrils daily. 01/26/24   Cox, Abigail, MD  Glucagon  (GVOKE HYPOPEN  1-PACK) 1 MG/0.2ML SOAJ INJECT 1 DOSE SUBCUTANEOUSLY AS NEEDED 04/17/24   Cox, Kirsten, MD  Insulin  Disposable Pump (OMNIPOD 5 G7 INTRO, GEN 5,) KIT 1 kit by Does not apply route as directed. 10/22/23   Cox, Abigail, MD  Insulin  Disposable Pump (OMNIPOD 5 G7 PODS, GEN 5,) MISC Inject 1 each into the skin as directed. Every 2 days 10/22/23   Cox, Abigail, MD  Insulin  Disposable Pump (OMNIPOD DASH PODS, GEN 4,) MISC 1 each by Does not apply route every 3 (three) days. 09/16/23   Sherre Abigail, MD  insulin   lispro (HUMALOG ) 100 UNIT/ML injection ON OMNIPOD. 200 UNITS PLUS EVERY 2 DAYS 05/09/24   CoxAbigail, MD  lisinopril  (ZESTRIL ) 2.5 MG tablet Take 1 tablet by mouth once daily 04/01/24   Cox, Abigail, MD  metFORMIN  (GLUCOPHAGE ) 1000 MG tablet Take 1 tablet by mouth twice daily 03/14/24   Cox, Kirsten, MD  methocarbamol  (ROBAXIN ) 750 MG tablet Take 1 tablet (750 mg total) by mouth every 6 (six) hours as needed (TMJ). 05/03/24   CoxAbigail, MD  metoprolol  succinate (TOPROL -XL) 50 MG 24 hr tablet Take 1 tablet (50 mg total) by mouth daily. Take with or immediately following a meal. 10/15/23 02/16/24  CoxAbigail, MD  mupirocin  ointment (BACTROBAN ) 2 % Apply topically 2 (two) times daily. 10/15/23   CoxAbigail, MD  nitroGLYCERIN  (NITROSTAT ) 0.4 MG SL tablet Place 1 tablet (0.4 mg total) under the tongue every 5 (five) minutes as needed for chest pain. 10/15/23   Sherre Abigail, MD  omeprazole  (PRILOSEC) 40 MG capsule Take 1 capsule by mouth once daily 02/28/24   Cox, Abigail, MD  ondansetron  (ZOFRAN ) 4 MG tablet Take 1 tablet (4 mg total) by mouth every 8 (eight) hours as needed for nausea or vomiting. 10/15/23   Sherre Abigail, MD  [facility] VERIO test strip 1 each  by Other route daily. 10/15/23   CoxAbigail, MD  pregabalin  (LYRICA ) 150 MG capsule Take 1 capsule (150 mg total) by mouth 2 (two) times daily. 11/05/23   CoxAbigail, MD  vitamin C  (VITAMIN C ) 500 MG tablet Take 1 tablet (500 mg total) by mouth 2 (two) times daily. 12/20/14   Deward Eck, PA-C  zolpidem  (AMBIEN ) 10 MG tablet Take 1 tablet (10 mg total) by mouth at bedtime as needed for sleep. 10/15/23 04/12/24  Sherre Abigail, MD    Family History Family History  Problem Relation Age of Onset   Diabetes Mother    CAD Mother    Liver disease Father     Social History Social History[1]   Allergies   Ozempic  (0.25 or 0.5 mg-dose) [semaglutide (0.25 or 0.5mg -dos)]   Review of Systems Review of Systems  Respiratory:  Positive for cough.      Physical Exam Triage Vital Signs ED Triage Vitals  Encounter Vitals Group     BP 05/17/24 1038 (!) 164/82     Girls Systolic BP Percentile --      Girls Diastolic BP Percentile --      Boys Systolic BP Percentile --      Boys Diastolic BP Percentile --      Pulse Rate 05/17/24 1038 (!) 110     Resp 05/17/24 1038 20     Temp 05/17/24 1038 99.7 F (37.6 C)     Temp Source 05/17/24 1038 Oral     SpO2 05/17/24 1038 96 %     Weight --      Height --      Head Circumference --      Peak Flow --      Pain Score 05/17/24 1037 8     Pain Loc --      Pain Education --      Exclude from Growth Chart --    No data found.  Updated Vital Signs BP (!) 164/82 (BP Location: Right Arm)   Pulse (!) 110   Temp 99.7 F (37.6 C) (Oral)   Resp 20   SpO2 96%   Visual Acuity Right Eye Distance:   Left Eye Distance:   Bilateral  Distance:    Right Eye Near:   Left Eye Near:    Bilateral Near:     Physical Exam   UC Treatments / Results  Labs (all labs ordered are listed, but only abnormal results are displayed) Labs Reviewed  POC COVID19/FLU A&B COMBO - Abnormal; Notable for the following components:      Result Value   Influenza A  Antigen, POC Positive (*)    All other components within normal limits    EKG   Radiology No results found.  Procedures Procedures (including critical care time)  Medications Ordered in UC Medications - No data to display  Initial Impression / Assessment and Plan / UC Course  I have reviewed the triage vital signs and the nursing notes.  Pertinent labs & imaging results that were available during my care of the patient were reviewed by me and considered in my medical decision making (see chart for details).     *** Final Clinical Impressions(s) / UC Diagnoses   Final diagnoses:  Influenza A  Acute cough     Discharge Instructions      Influenza A-flu test positive here today.  Treating with Tamiflu.   Also treating for possible pneumonia.  Cough meds as prescribed  Follow up as needed.  Recommend over-the-counter medications for symptoms as needed.  Rest, hydrate and follow-up as needed    ED Prescriptions     Medication Sig Dispense Auth. Provider   oseltamivir (TAMIFLU) 75 MG capsule Take 1 capsule (75 mg total) by mouth every 12 (twelve) hours. 10 capsule Ginny Loomer A, FNP   azithromycin  (ZITHROMAX ) 250 MG tablet Take 2 tablets (500 mg total) by mouth daily for 1 day, THEN 1 tablet (250 mg total) daily for 4 days. 6 tablet Brienna Bass A, FNP   promethazine -dextromethorphan (PROMETHAZINE -DM) 6.25-15 MG/5ML syrup Take 5 mLs by mouth 4 (four) times daily as needed for cough. 118 mL Adah Corning A, FNP      PDMP not reviewed this encounter.    [1]  Social History Tobacco Use   Smoking status: Former    Current packs/day: 0.00    Average packs/day: 1.0 packs/day    Types: Cigarettes    Quit date: 02/20/2007    Years since quitting: 17.2   Smokeless tobacco: Never  Vaping Use   Vaping status: Never Used  Substance Use Topics   Alcohol use: No    Alcohol/week: 0.0 standard drinks of alcohol    Comment: None in years   Drug use: No   "

## 2024-05-17 NOTE — ED Triage Notes (Signed)
 Pt states he was seen on 04/19/24 and was treated for sinus infection. Since then he has continued to have a cough, nasal congestion, HA, facial pain, and he started to run a fever over the weekend. He states he feels that he is full of fluid. He has continued to take mucinex and tylenol  with temporary relief. States his whole family is sick as well.

## 2024-05-17 NOTE — Discharge Instructions (Addendum)
 Influenza A-flu test positive here today.  Treating with Tamiflu.   Also treating for possible pneumonia.  Cough meds as prescribed  Follow up as needed.  Recommend over-the-counter medications for symptoms as needed.  Rest, hydrate and follow-up as needed

## 2024-05-22 ENCOUNTER — Other Ambulatory Visit: Payer: Self-pay | Admitting: Family Medicine

## 2024-05-22 DIAGNOSIS — R9389 Abnormal findings on diagnostic imaging of other specified body structures: Secondary | ICD-10-CM

## 2024-05-23 ENCOUNTER — Other Ambulatory Visit: Payer: Self-pay | Admitting: Family Medicine

## 2024-05-23 MED ORDER — GUAIFENESIN-CODEINE 200-8 MG/5ML PO LIQD: 5.0000 mL | Freq: Two times a day (BID) | ORAL | 0 refills | Status: DC | PRN

## 2024-05-24 ENCOUNTER — Other Ambulatory Visit: Payer: Self-pay

## 2024-05-24 MED ORDER — GUAIFENESIN-CODEINE 100-10 MG/5ML PO SOLN: 5.0000 mL | Freq: Three times a day (TID) | ORAL | 0 refills | Status: AC | PRN

## 2024-05-25 NOTE — Progress Notes (Signed)
 Sent. Dr. Sherre

## 2024-06-01 ENCOUNTER — Other Ambulatory Visit: Payer: Self-pay | Admitting: Family Medicine

## 2024-06-01 DIAGNOSIS — E1142 Type 2 diabetes mellitus with diabetic polyneuropathy: Secondary | ICD-10-CM

## 2024-06-09 ENCOUNTER — Other Ambulatory Visit: Payer: Self-pay

## 2024-06-09 DIAGNOSIS — E1142 Type 2 diabetes mellitus with diabetic polyneuropathy: Secondary | ICD-10-CM

## 2024-06-09 DIAGNOSIS — I119 Hypertensive heart disease without heart failure: Secondary | ICD-10-CM

## 2024-06-09 DIAGNOSIS — E782 Mixed hyperlipidemia: Secondary | ICD-10-CM

## 2024-06-09 DIAGNOSIS — K2100 Gastro-esophageal reflux disease with esophagitis, without bleeding: Secondary | ICD-10-CM

## 2024-06-09 MED ORDER — ATORVASTATIN CALCIUM 80 MG PO TABS: 80.0000 mg | ORAL_TABLET | Freq: Every day | ORAL | 3 refills | Status: AC

## 2024-06-09 MED ORDER — OMEPRAZOLE 40 MG PO CPDR: 40.0000 mg | DELAYED_RELEASE_CAPSULE | Freq: Every day | ORAL | 1 refills | Status: AC

## 2024-06-09 MED ORDER — METOPROLOL SUCCINATE ER 50 MG PO TB24: 50.0000 mg | ORAL_TABLET | Freq: Every day | ORAL | 3 refills | Status: AC

## 2024-06-09 MED ORDER — LISINOPRIL 2.5 MG PO TABS: 2.5000 mg | ORAL_TABLET | Freq: Every day | ORAL | 0 refills | Status: AC

## 2024-06-09 MED ORDER — PREGABALIN 150 MG PO CAPS: 150.0000 mg | ORAL_CAPSULE | Freq: Two times a day (BID) | ORAL | 3 refills | Status: AC

## 2024-06-09 MED ORDER — METFORMIN HCL 1000 MG PO TABS: 1000.0000 mg | ORAL_TABLET | Freq: Two times a day (BID) | ORAL | 0 refills | Status: AC

## 2024-08-03 ENCOUNTER — Ambulatory Visit: Admitting: Family Medicine
